# Patient Record
Sex: Female | Born: 1970 | Hispanic: No | Marital: Married | Smoking: Never smoker
Health system: Southern US, Community
[De-identification: ages and names within clinical notes are randomized; demographics above are authoritative.]

## PROBLEM LIST (undated history)

## (undated) DIAGNOSIS — B019 Varicella without complication: Secondary | ICD-10-CM

## (undated) DIAGNOSIS — Z0000 Encounter for general adult medical examination without abnormal findings: Secondary | ICD-10-CM

## (undated) DIAGNOSIS — N760 Acute vaginitis: Principal | ICD-10-CM

## (undated) DIAGNOSIS — J341 Cyst and mucocele of nose and nasal sinus: Secondary | ICD-10-CM

## (undated) DIAGNOSIS — F419 Anxiety disorder, unspecified: Secondary | ICD-10-CM

## (undated) DIAGNOSIS — T7840XA Allergy, unspecified, initial encounter: Secondary | ICD-10-CM

## (undated) DIAGNOSIS — B351 Tinea unguium: Secondary | ICD-10-CM

## (undated) DIAGNOSIS — M545 Low back pain: Secondary | ICD-10-CM

## (undated) DIAGNOSIS — R5383 Other fatigue: Secondary | ICD-10-CM

## (undated) HISTORY — DX: Acute vaginitis: N76.0

## (undated) HISTORY — PX: OTHER SURGICAL HISTORY: SHX169

## (undated) HISTORY — DX: Encounter for general adult medical examination without abnormal findings: Z00.00

## (undated) HISTORY — DX: Other fatigue: R53.83

## (undated) HISTORY — DX: Anxiety disorder, unspecified: F41.9

## (undated) HISTORY — DX: Varicella without complication: B01.9

## (undated) HISTORY — DX: Cyst and mucocele of nose and nasal sinus: J34.1

## (undated) HISTORY — DX: Tinea unguium: B35.1

## (undated) HISTORY — DX: Low back pain: M54.5

## (undated) HISTORY — DX: Allergy, unspecified, initial encounter: T78.40XA

---

## 2000-07-09 LAB — HM PAP SMEAR

## 2010-03-09 ENCOUNTER — Encounter: Payer: Self-pay | Admitting: Family Medicine

## 2010-03-09 LAB — CONVERTED CEMR LAB

## 2010-09-25 ENCOUNTER — Encounter: Payer: Self-pay | Admitting: Family Medicine

## 2010-09-25 ENCOUNTER — Ambulatory Visit (INDEPENDENT_AMBULATORY_CARE_PROVIDER_SITE_OTHER): Payer: BC Managed Care – PPO | Admitting: Family Medicine

## 2010-09-25 DIAGNOSIS — Z87448 Personal history of other diseases of urinary system: Secondary | ICD-10-CM

## 2010-09-25 DIAGNOSIS — E876 Hypokalemia: Secondary | ICD-10-CM

## 2010-09-25 DIAGNOSIS — J342 Deviated nasal septum: Secondary | ICD-10-CM | POA: Insufficient documentation

## 2010-09-25 DIAGNOSIS — Z9189 Other specified personal risk factors, not elsewhere classified: Secondary | ICD-10-CM | POA: Insufficient documentation

## 2010-09-25 DIAGNOSIS — J019 Acute sinusitis, unspecified: Secondary | ICD-10-CM | POA: Insufficient documentation

## 2010-10-02 ENCOUNTER — Other Ambulatory Visit: Payer: Self-pay | Admitting: Family Medicine

## 2010-10-02 DIAGNOSIS — J342 Deviated nasal septum: Secondary | ICD-10-CM

## 2010-10-03 ENCOUNTER — Telehealth: Payer: Self-pay

## 2010-10-03 NOTE — Telephone Encounter (Signed)
Left a message for pt to return my call. 

## 2010-10-03 NOTE — Telephone Encounter (Signed)
It is up to her but at this point I would probably proceed if I were her, it is the only way to get further info.

## 2010-10-03 NOTE — Telephone Encounter (Signed)
Pt called stating that sore in nose had "popped" she was wandering if she still needed to go for the CT?

## 2010-10-03 NOTE — Telephone Encounter (Signed)
Pt informed and states she will go to the CT appt

## 2010-10-04 ENCOUNTER — Ambulatory Visit (HOSPITAL_COMMUNITY)
Admission: RE | Admit: 2010-10-04 | Discharge: 2010-10-04 | Disposition: A | Discharge status: Home / Self Care | Payer: BC Managed Care – PPO | Source: Ambulatory Visit | Attending: Family Medicine | Admitting: Family Medicine

## 2010-10-04 DIAGNOSIS — J320 Chronic maxillary sinusitis: Secondary | ICD-10-CM | POA: Insufficient documentation

## 2010-10-04 DIAGNOSIS — J342 Deviated nasal septum: Secondary | ICD-10-CM

## 2010-10-04 DIAGNOSIS — J3489 Other specified disorders of nose and nasal sinuses: Secondary | ICD-10-CM | POA: Insufficient documentation

## 2010-10-05 ENCOUNTER — Other Ambulatory Visit: Payer: Self-pay

## 2010-10-05 MED ORDER — LEVOFLOXACIN 500 MG PO TABS: 500.0000 mg | ORAL_TABLET | Freq: Every day | ORAL | Status: AC

## 2010-10-05 NOTE — Progress Notes (Signed)
Pt informed

## 2010-10-05 NOTE — Assessment & Plan Note (Signed)
Summary: New pt est care/dt [employer]   Vital Signs:  Patient profile:   40 year old female Menstrual status:  regular LMP:     09/12/2010 Height:      68.7 inches (174.50 cm) Weight:      141.75 pounds (64.43 kg) BMI:     21.19 O2 Sat:      100 % on Room air Temp:     97.7 degrees F (36.50 degrees C) oral Pulse rate:   74 / minute BP sitting:   111 / 72  (right arm)  Vitals Entered By: Josph Macho RMA (September 25, 2010 2:25 PM)  O2 Flow:  Room air CC: Establish new patient/ CF Is Patient Diabetic? No LMP (date): 09/12/2010     Menstrual Status regular Enter LMP: 09/12/2010 Last PAP Result historical   History of Present Illness:  patient is a 40 year old female in today to establish care. she has a couple of concerns. She notes she had a bad sinus infection in December of 2000 and and ultimately was treated with antibiotics at that time noted a palpable lump or lesion at the base of her left nares which has not resolved. She continues to have nasal congestion difficulty with her breathing at times,  he denies any fevers, chills, headache, ear pain, sore throat , green rhinorrhea. she brings in lab work done at her place of work which was all within normal limits cholesterol was normal hepatic and CBC were normal but her potassium was mildly low at 3.3 she denies muscle cramps or fasciculations. 2 G2 P2 status post 2 spontaneous vaginal deliveries and received her GYN care at [facility] care. She is a distant history of some abnormal cervical cytology did undergo cryotherapy and cervical biopsy. Repeat biopsy was normal. Patient is denying any chest pain, palpitations, shortness of breath , GI or GU concerns at this time  Preventive Screening-Counseling & Management  Alcohol-Tobacco     Smoking Status: never  Caffeine-Diet-Exercise     Does Patient Exercise: yes      Drug Use:  no.    Current Medications (verified): 1)  Allegra Allergy 180 Mg Tabs (Fexofenadine Hcl)  .... Qd 2)  Prenatal Vitamin .... 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, 2 children Never Smoked Alcohol use-yes, occasional special  events Drug use-no Wears seat belt  No dietary restrictions Regular exercise-yes, run and walk and weight lifting Smoking Status:  never Occupation:  employed Drug Use:  no Does Patient Exercise:  yes  Review of Systems  The patient denies anorexia, fever, weight loss, weight gain, vision loss, decreased hearing, hoarseness, chest pain, syncope, dyspnea on exertion, peripheral edema, prolonged cough, headaches, hemoptysis, abdominal pain, melena, hematochezia, severe indigestion/heartburn, hematuria, incontinence, genital sores, muscle weakness, suspicious skin lesions, transient blindness, difficulty walking, depression, unusual weight change, abnormal bleeding, enlarged lymph nodes, angioedema, breast masses, and testicular masses.    Physical Exam  General:  Well-developed,well-nourished,in no acute distress; alert,appropriate and cooperative throughout examination Head:  Normocephalic and atraumatic without obvious abnormalities. No apparent alopecia or balding. Eyes:  No corneal or conjunctival inflammation noted. EOMI. Perrla.  Ears:  External  ear exam shows no significant lesions or deformities.  Otoscopic examination reveals clear canals, tympanic membranes are intact bilaterally without bulging, retraction, inflammation or discharge. Hearing is grossly normal bilaterally. Nose:  External nasal examination shows no deformity or  inflammation. Nasal mucosa are pink and moist without lesions or exudates.boggy enlarged anterior turbinates noted b/l L>R. Bony proiminence noted at opening of left nares laterally Mouth:  Oral mucosa and oropharynx without lesions or exudates.  Teeth in good repair. Neck:  No deformities, masses, or tenderness noted. Lungs:  Normal respiratory effort, chest expands symmetrically. Lungs are clear to auscultation, no crackles or wheezes. Heart:  Normal rate and regular rhythm. S1 and S2 normal without gallop, murmur, click, rub or other extra sounds. Abdomen:  Bowel sounds positive,abdomen soft and non-tender without masses, organomegaly or hernias noted. Msk:  No deformity or scoliosis noted of thoracic or lumbar spine.   Pulses:  R and L carotid, dorsalis pedis and posterior tibial pulses are full and equal bilaterally Extremities:  No clubbing, cyanosis, edema, or deformity noted with normal full range of motion of all joints.   Neurologic:  No cranial nerve deficits noted. Station and gait are normal. Plantar reflexes are down-going bilaterally. DTRs are symmetrical throughout. Sensory, motor and coordinative functions appear intact. Skin:  Intact without suspicious lesions or rashes Cervical Nodes:  No lymphadenopathy noted Psych:  Cognition and judgment appear intact. Alert and cooperative with normal attention span and concentration. No apparent delusions, illusions, hallucinations   Impression & Recommendations:  Problem # 1:  ACUTE SINUSITIS, UNSPECIFIED (ICD-461.9)  Her updated medication list for this problem includes:    Ayr 0.65 % Soln (Saline) ..... Spray in each nostril bid    Flonase 50 Mcg/act Susp (Fluticasone propionate) .Marland Kitchen... 1-2 sprays each nostril daily as needed allergies  Orders: Radiology Referral (Radiology) With bony prminence noted and deviated septum will obtain a CT sinuses to further evaluate  Allegra is causing some sedation, she may try Zyrtec  Problem #  2:  HYPOKALEMIA, MILD (ICD-276.8)  Orders: T-Basic Metabolic Panel [phone]) Encouraged increased K in diet and recheck level  Problem # 3:  Preventive Health Care (ICD-V70.0) Doing well, labs at work normal except K will continue to monitor  Complete Medication List: 1)  Allegra Allergy 180 Mg Tabs (Fexofenadine hcl) .... Qd 2)  Prenatal Vitamin  .... Once daily 3)  Ayr 0.65 % Soln (Saline) .... Spray in each nostril bid 4)  Zyrtec Allergy 10 Mg Tabs (Cetirizine hcl) .Marland Kitchen.. 1 tab by mouth daily for allergies, will replace allegra 5)  Flonase 50 Mcg/act Susp (Fluticasone propionate) .Marland Kitchen.. 1-2 sprays each nostril daily as needed allergies  Patient Instructions: 1)  Please schedule a follow-up appointment in 2 to 3  months.  2)  Call with any concerns   Orders Added: 1)  Radiology Referral [Radiology] 2)  T-Basic Metabolic Panel [phone] 3)  New Patient 18-39 years [[id number]]    Preventive Care Screening  Pap Smear:    Date:  03/09/2010    Results:  historical    Appended Document: Orders Update    Clinical Lists Changes  Orders: Added new Referral order of Radiology Referral (Radiology) - Signed

## 2010-11-02 ENCOUNTER — Encounter: Payer: Self-pay | Admitting: Nurse Practitioner

## 2010-11-02 NOTE — Progress Notes (Signed)
Subjective:    Gail Sanchez is a 40 y.o. female who presents for evaluation of discharge, erythema, itching, pain and tearing in left eye worse than right. She has noticed the above symptoms for 2 days. Onset was acute. Patient denies blurred vision, foreign body sensation, photophobia and visual field deficit. There is a history of eye problems.  The following portions of the patient's history were reviewed and updated as appropriate: allergies, current medications, past family history, past medical history, past social history, past surgical history and problem list.  Review of Systems Pertinent items are noted in HPI.   Objective:    There were no vitals taken for this visit.      General: alert, cooperative, appears stated age and no distress  Eyes:  positive findings: conjunctiva: erythematous and sclera injected bil greater on left than right,   Vision: Not performed  Fluorescein:  not done     Assessment:    Acute conjunctivitis   Plan:    Discussed the diagnosis and proper care of conjunctivitis.  Stressed household Presenter, broadcasting. Ophthalmic drops per orders. Warm compress to eye(s). FU here in 2 days or PRN.

## 2011-06-06 ENCOUNTER — Ambulatory Visit: Payer: BC Managed Care – PPO | Admitting: Family Medicine

## 2011-09-28 ENCOUNTER — Encounter: Payer: Self-pay | Admitting: Family Medicine

## 2011-09-28 ENCOUNTER — Ambulatory Visit (INDEPENDENT_AMBULATORY_CARE_PROVIDER_SITE_OTHER): Payer: BC Managed Care – PPO | Admitting: Family Medicine

## 2011-09-28 VITALS — BP 129/82 | HR 85 | Temp 99.2°F | Ht 68.75 in | Wt 191.0 lb

## 2011-09-28 DIAGNOSIS — E876 Hypokalemia: Secondary | ICD-10-CM

## 2011-09-28 DIAGNOSIS — J019 Acute sinusitis, unspecified: Secondary | ICD-10-CM

## 2011-09-28 DIAGNOSIS — R5383 Other fatigue: Secondary | ICD-10-CM

## 2011-09-28 DIAGNOSIS — R5381 Other malaise: Secondary | ICD-10-CM

## 2011-09-28 HISTORY — DX: Other fatigue: R53.83

## 2011-09-28 MED ORDER — MUPIROCIN 2 % EX OINT: TOPICAL_OINTMENT | Freq: Three times a day (TID) | CUTANEOUS | Status: AC

## 2011-09-28 MED ORDER — AMOXICILLIN-POT CLAVULANATE 875-125 MG PO TABS: 1.0000 | ORAL_TABLET | Freq: Two times a day (BID) | ORAL | Status: AC

## 2011-09-28 NOTE — Assessment & Plan Note (Addendum)
Left sided with recurrent follicular lesion in nares, burst this am. Patient is [redacted] weeks pregnant at this time, she is started on Mupirocin ointment qhs, after cleansing with peroxide and given Augmentin, she will report if symptoms worsen and she will return after preganacy for further evaluation and possible referral to ent

## 2011-09-28 NOTE — Assessment & Plan Note (Signed)
Given a handout for hi potassium foods, and we will check levels after pregnancy

## 2011-09-28 NOTE — Progress Notes (Signed)
Patient ID: Gail Sanchez, female   DOB: 1970-07-20, 41 y.o.   MRN: [id number] Thelia Tanksley Monterosso [id number] 1971-02-04 09/28/2011      Progress Note-Follow Up  Subjective  Chief Complaint  Chief Complaint  Patient presents with  . lump in nose    burst today, has had before    HPI  Patient is a 41 year old female who is in today for evaluation of recurrent left nares lesions and sinusitis. She is actually [redacted] weeks pregnant at this time and has been struggling with all painful lesion in her left nostril off and on for the last year. This morning the lesion ruptured and she expelled a great deal of yellowish liquid. It was nonmobile rest. She's not had any fevers, chills. She does have an occasional headache but none recently. She denies any sore throat, ear pain, chest congestion, chest pain, palpitations, shortness of breath. She denies any anorexia GI or GU complaints. She has seen ENT in the distant past for these lesions and was offered some reassurance that was unremarkable it is now been very recurrent and symptomatic for a long time. She denies any similar lesions in the right nares. She is also complaining of excessive fatigue. She feels like she sleeps a good 8-10 hours and still feels very tired to the point where she can fall sleep in the morning. She denies snoring. She's not had any recent muscle cramping or other concerns today.    History   Social History  . Marital Status: Married    Spouse Name: N/A    Number of Children: N/A  . Years of Education: N/A   Occupational History  . Not on file.   Social History Main Topics  . Smoking status: Never Smoker   . Smokeless tobacco: Never Used  . Alcohol Use: Yes     occ  . Drug Use: No  . Sexually Active: Not on file   Other Topics Concern  . Not on file   Social History Narrative  . No narrative on file    No current outpatient prescriptions on file prior to visit.    No Known Allergies  Review of  Systems  Review of Systems  Constitutional: Positive for malaise/fatigue. Negative for fever.  HENT: Positive for congestion. Negative for sore throat.   Eyes: Negative for discharge.  Respiratory: Negative for shortness of breath.   Cardiovascular: Negative for chest pain, palpitations and leg swelling.  Gastrointestinal: Negative for nausea, abdominal pain and diarrhea.  Genitourinary: Negative for dysuria.  Musculoskeletal: Negative for falls.  Skin: Negative for rash.  Neurological: Negative for loss of consciousness and headaches.  Endo/Heme/Allergies: Negative for polydipsia.  Psychiatric/Behavioral: Negative for depression and suicidal ideas. The patient is not nervous/anxious and does not have insomnia.     Objective  BP 129/82  Pulse 85  Temp(Src) 99.2 F (37.3 C) (Temporal)  Ht 5' 8.75" (1.746 m)  Wt 191 lb (86.637 kg)  BMI 28.41 kg/m2  Physical Exam  Physical Exam  Constitutional: She is oriented to person, place, and time and well-developed, well-nourished, and in no distress. No distress.       pregnant  HENT:  Head: Normocephalic and atraumatic.       Nasal mucosa boggy and erythematous, turbinates hypertrophied  Eyes: Conjunctivae are normal.  Neck: Neck supple. No thyromegaly present.  Cardiovascular: Normal rate, regular rhythm and normal heart sounds.   No murmur heard. Pulmonary/Chest: Effort normal and breath sounds normal. She has no wheezes.  Abdominal: She exhibits no distension and no mass.  Musculoskeletal: She exhibits no edema.  Lymphadenopathy:    She has no cervical adenopathy.  Neurological: She is alert and oriented to person, place, and time.  Skin: Skin is warm and dry. No rash noted. She is not diaphoretic.  Psychiatric: Memory, affect and judgment normal.       Assessment & Plan    ACUTE SINUSITIS, UNSPECIFIED Left sided with recurrent follicular lesion in nares, burst this am. Patient is [redacted] weeks pregnant at this time, she  is started on Mupirocin ointment qhs, after cleansing with peroxide and given Augmentin, she will report if symptoms worsen and she will return after preganacy for further evaluation and possible referral to ent  HYPOKALEMIA, MILD Given a handout for hi potassium foods, and we will check levels after pregnancy  Fatigue Multifactorial and was present prior to pregnancy. Willl check annual labs with TSH, cbc and vitamin levels after pregnancy and she will check with her husband to see if she snores.

## 2011-09-28 NOTE — Patient Instructions (Signed)
Sinusitis Sinuses are air pockets within the bones of your face. The growth of bacteria within a sinus leads to infection. The infection prevents the sinuses from draining. This infection is called sinusitis. SYMPTOMS  There will be different areas of pain depending on which sinuses have become infected.  The maxillary sinuses often produce pain beneath the eyes.   Frontal sinusitis may cause pain in the middle of the forehead and above the eyes.  Other problems (symptoms) include:  Toothaches.   Colored, pus-like (purulent) drainage from the nose.   Swelling, warmth, and tenderness over the sinus areas may be signs of infection.  TREATMENT  Sinusitis is most often determined by an exam.X-rays may be taken. If x-rays have been taken, make sure you obtain your results or find out how you are to obtain them. Your caregiver may give you medications (antibiotics). These are medications that will help kill the bacteria causing the infection. You may also be given a medication (decongestant) that helps to reduce sinus swelling.  HOME CARE INSTRUCTIONS   Only take over-the-counter or prescription medicines for pain, discomfort, or fever as directed by your caregiver.   Drink extra fluids. Fluids help thin the mucus so your sinuses can drain more easily.   Applying either moist heat or ice packs to the sinus areas may help relieve discomfort.   Use saline nasal sprays to help moisten your sinuses. The sprays can be found at your local drugstore.  SEEK IMMEDIATE MEDICAL CARE IF:  You have a fever.   You have increasing pain, severe headaches, or toothache.   You have nausea, vomiting, or drowsiness.   You develop unusual swelling around the face or trouble seeing.  MAKE SURE YOU:   Understand these instructions.   Will watch your condition.   Will get help right away if you are not doing well or get worse.  Document Released: 06/25/2005 Document Revised: 06/14/2011 Document Reviewed:  01/22/2007 [facility] Patient Information 2012 [place], [place].  Clean nares with hydrogen peroxide nightly then apply antibiotic ointment

## 2011-09-28 NOTE — Assessment & Plan Note (Signed)
Multifactorial and was present prior to pregnancy. Willl check annual labs with TSH, cbc and vitamin levels after pregnancy and she will check with her husband to see if she snores.

## 2011-12-27 ENCOUNTER — Encounter: Payer: Self-pay | Admitting: Family Medicine

## 2011-12-27 ENCOUNTER — Other Ambulatory Visit (HOSPITAL_COMMUNITY)
Admission: RE | Admit: 2011-12-27 | Discharge: 2011-12-27 | Disposition: A | Discharge status: Home / Self Care | Payer: BC Managed Care – PPO | Source: Ambulatory Visit | Attending: Family Medicine | Admitting: Family Medicine

## 2011-12-27 ENCOUNTER — Ambulatory Visit (INDEPENDENT_AMBULATORY_CARE_PROVIDER_SITE_OTHER): Payer: BC Managed Care – PPO | Admitting: Family Medicine

## 2011-12-27 VITALS — BP 124/78 | HR 66 | Temp 98.5°F | Ht 68.75 in | Wt 153.4 lb

## 2011-12-27 DIAGNOSIS — N760 Acute vaginitis: Secondary | ICD-10-CM | POA: Insufficient documentation

## 2011-12-27 HISTORY — DX: Acute vaginitis: N76.0

## 2011-12-27 MED ORDER — FLUCONAZOLE 150 MG PO TABS: 150.0000 mg | ORAL_TABLET | ORAL | Status: DC

## 2011-12-27 NOTE — Assessment & Plan Note (Signed)
Exam c/w candida, given an rx for diflucan and asked to start a probiotic, culture checked for bv and yeast

## 2011-12-27 NOTE — Progress Notes (Signed)
Patient ID: Gail Sanchez, female   DOB: 20-Dec-1970, 41 y.o.   MRN: [id number] Gail Sanchez [id number] 1971-06-08 12/27/2011      Progress Note-Follow Up  Subjective  Chief Complaint  Chief Complaint  Patient presents with  . Vaginal Discharge    odor but no itching or burning    HPI  Patient is a 41 year old female who is in today complaining of some vaginal discharge with odor. She denies any itching or burning. She denies any abdominal pain or back pain. She denies any fevers or chills. She's not had any recent change in partners for any urinary complaints. She is just come off her menstrual cycle and notes that this now with slightly worse before her cycle and is now. No anorexia, nausea, chest pain, palpitations or other concerns noted today appear  Past Medical History  Diagnosis Date  . Fatigue 09/28/2011  . Chicken pox as a child  . Vaginitis 12/27/2011    Past Surgical History  Procedure Date  . Cervix frozen     Family History  Problem Relation Age of Onset  . Cancer Mother   . Hypertension Father   . Asthma Brother     History   Social History  . Marital Status: Married    Spouse Name: N/A    Number of Children: N/A  . Years of Education: N/A   Occupational History  . Not on file.   Social History Main Topics  . Smoking status: Never Smoker   . Smokeless tobacco: Never Used  . Alcohol Use: Yes     occ  . Drug Use: No  . Sexually Active: Not on file   Other Topics Concern  . Not on file   Social History Narrative  . No narrative on file    Current Outpatient Prescriptions on File Prior to Visit  Medication Sig Dispense Refill  . Prenatal Vit-Fe Fumarate-FA (PRENATAL MULTIVITAMIN) TABS Take 1 tablet by mouth daily.        No Known Allergies  Review of Systems  Review of Systems  Constitutional: Negative for fever, chills and malaise/fatigue.  HENT: Negative for congestion.   Eyes: Negative for discharge.  Respiratory:  Negative for shortness of breath.   Cardiovascular: Negative for chest pain, palpitations and leg swelling.  Gastrointestinal: Negative for nausea, abdominal pain, diarrhea and constipation.  Genitourinary: Negative for dysuria, urgency, frequency, hematuria and flank pain.  Musculoskeletal: Negative for falls.  Skin: Negative for rash.  Neurological: Negative for loss of consciousness and headaches.  Endo/Heme/Allergies: Negative for polydipsia.  Psychiatric/Behavioral: Negative for depression and suicidal ideas. The patient is not nervous/anxious and does not have insomnia.     Objective  BP 124/78  Pulse 66  Temp 98.5 F (36.9 C) (Temporal)  Ht 5' 8.75" (1.746 m)  Wt 153 lb 6.4 oz (69.582 kg)  BMI 22.82 kg/m2  SpO2 98%  LMP 12/20/2011  Breastfeeding? Unknown  Physical Exam  Physical Exam  Constitutional: She is oriented to person, place, and time and well-developed, well-nourished, and in no distress. No distress.  HENT:  Head: Normocephalic and atraumatic.  Eyes: Conjunctivae are normal.  Neck: Neck supple. No thyromegaly present.  Cardiovascular: Normal rate, regular rhythm and normal heart sounds.   No murmur heard. Pulmonary/Chest: Effort normal and breath sounds normal. She has no wheezes.  Abdominal: She exhibits no distension and no mass.  Genitourinary: Uterus normal and cervix normal. Vaginal discharge found.       Thick white vaginal discharge  Musculoskeletal: She exhibits no edema.  Lymphadenopathy:    She has no cervical adenopathy.  Neurological: She is alert and oriented to person, place, and time.  Skin: Skin is warm and dry. No rash noted. She is not diaphoretic.  Psychiatric: Memory, affect and judgment normal.      Assessment & Plan  Vaginitis Exam c/w candida, given an rx for diflucan and asked to start a probiotic, culture checked for bv and yeast

## 2011-12-27 NOTE — Patient Instructions (Addendum)
Vaginitis Vaginitis is an infection. It causes soreness, swelling, and redness (inflammation) of the vagina. Many of these infections are sexually transmitted diseases (STDs). Having unprotected sex can cause further problems and complications such as:  Chronic pelvic pain.   Infertility.   Unwanted pregnancy.   Abortion.   Tubal pregnancy.   Infection passed on to the newborn.   Cancer.  CAUSES   Monilia. This is a yeast or fungus infection, not an STD.   Bacterial vaginosis. The normal balance of bacteria in the vagina is disrupted and is replaced by an overgrowth of certain bacteria.   Gonorrhea, chlamydia. These are bacterial infections that are STDs.   Vaginal sponges, diaphragms, and intrauterine devices.   Trichomoniasis. This is a STD infection caused by a parasite.   Viruses like herpes and human papillomavirus. Both are STDs.   Pregnancy.   Immunosuppression. This occurs with certain conditions such as HIV infection or cancer.   Using bubble bath.   Taking certain antibiotic medicines.   Sporadic recurrence can occur if you become sick.   Diabetes.   Steroids.   Allergic reaction. If you have an allergy to:   Douches.   Soaps.   Spermicides.   Condoms.   Scented tampons or vaginal sprays.  SYMPTOMS   Abnormal vaginal discharge.   Itching of the vagina.   Pain in the vagina.   Swelling of the vagina.  In some cases, there are no symptoms. TREATMENT  Treatment will vary depending on the type of infection.  Bacteria or trichomonas are usually treated with oral antibiotics and sometimes vaginal cream or suppositories.   Monilia vaginitis is usually treated with vaginal creams, suppositories, or oral antifungal pills.   Viral vaginitis has no cure. However, the symptoms of herpes (a viral vaginitis) can be treated to relieve the discomfort. Human papillomavirus has no symptoms. However, there are treatments for the diseases caused by human  papillomavirus.   With allergic vaginitis, you need to stop using the product that is causing the problem. Vaginal creams can be used to treat the symptoms.   When treating an STD, the sex partner should also be treated.  HOME CARE INSTRUCTIONS   Take all the medicines as directed by your caregiver.   Do not use scented tampons, soaps, or vaginal sprays.   Do not douche.   Tell your sex partner if you have a vaginal infection or an STD.   Do not have sexual intercourse until you have treated the vaginitis.   Practice safe sex by using condoms.  SEEK MEDICAL CARE IF:   You have abdominal pain.   Your symptoms get worse during treatment.  Document Released: 04/22/2007 Document Revised: 06/14/2011 Document Reviewed: 12/16/2008 [employer] Patient Information 2012 [employer]. 

## 2012-01-04 ENCOUNTER — Telehealth: Payer: Self-pay

## 2012-01-04 MED ORDER — METRONIDAZOLE 500 MG PO TABS: 500.0000 mg | ORAL_TABLET | Freq: Two times a day (BID) | ORAL | Status: DC

## 2012-01-04 NOTE — Telephone Encounter (Signed)
OK to treat the Flagyl 500 mg po bid x 5 days

## 2012-01-04 NOTE — Telephone Encounter (Signed)
I spoke with Cytology on pts ancillary orders and the Gardnerella is positive and the machine that checks Candida is broke and Cytology is checking on this. Please advise?

## 2012-01-04 NOTE — Telephone Encounter (Signed)
Patient informed and RX sent to pharmacy 

## 2012-05-26 ENCOUNTER — Other Ambulatory Visit (HOSPITAL_COMMUNITY): Payer: Self-pay | Admitting: Obstetrics and Gynecology

## 2012-05-26 DIAGNOSIS — Z1231 Encounter for screening mammogram for malignant neoplasm of breast: Secondary | ICD-10-CM

## 2012-05-27 ENCOUNTER — Other Ambulatory Visit (INDEPENDENT_AMBULATORY_CARE_PROVIDER_SITE_OTHER): Payer: BC Managed Care – PPO

## 2012-05-27 DIAGNOSIS — Z0000 Encounter for general adult medical examination without abnormal findings: Secondary | ICD-10-CM

## 2012-05-27 DIAGNOSIS — E876 Hypokalemia: Secondary | ICD-10-CM

## 2012-05-27 LAB — RENAL FUNCTION PANEL
Albumin: 4.2 g/dL (ref 3.5–5.2)
CO2: 29 mEq/L (ref 19–32)
Calcium: 9.1 mg/dL (ref 8.4–10.5)
Glucose, Bld: 74 mg/dL (ref 70–99)
Potassium: 3.2 mEq/L — ABNORMAL LOW (ref 3.5–5.1)

## 2012-05-27 LAB — HEPATIC FUNCTION PANEL
ALT: 13 U/L (ref 0–35)
AST: 17 U/L (ref 0–37)
Alkaline Phosphatase: 54 U/L (ref 39–117)
Bilirubin, Direct: 0 mg/dL (ref 0.0–0.3)
Total Protein: 7.4 g/dL (ref 6.0–8.3)

## 2012-05-27 LAB — LIPID PANEL
Cholesterol: 185 mg/dL (ref 0–200)
LDL Cholesterol: 100 mg/dL — ABNORMAL HIGH (ref 0–99)
Total CHOL/HDL Ratio: 3

## 2012-05-27 LAB — CBC WITH DIFFERENTIAL/PLATELET
Basophils Relative: 0.3 % (ref 0.0–3.0)
Eosinophils Relative: 6.5 % — ABNORMAL HIGH (ref 0.0–5.0)
HCT: 40 % (ref 36.0–46.0)
MCV: 91.2 fl (ref 78.0–100.0)
Monocytes Absolute: 0.3 10*3/uL (ref 0.1–1.0)
Neutrophils Relative %: 64.3 % (ref 43.0–77.0)
RBC: 4.38 Mil/uL (ref 3.87–5.11)
WBC: 5.8 10*3/uL (ref 4.5–10.5)

## 2012-05-28 MED ORDER — POTASSIUM CHLORIDE CRYS ER 20 MEQ PO TBCR: 20.0000 meq | EXTENDED_RELEASE_TABLET | Freq: Every day | ORAL | Status: DC

## 2012-05-28 NOTE — Progress Notes (Signed)
Quick Note:  Patient Informed and voiced understanding.  RX sent to pharmacy ______ 

## 2012-05-28 NOTE — Addendum Note (Signed)
Addended by: Court Joy on: 05/28/2012 04:52 PM   Modules accepted: Orders

## 2012-05-29 ENCOUNTER — Telehealth: Payer: Self-pay

## 2012-05-29 NOTE — Telephone Encounter (Signed)
Patient informed. 

## 2012-05-29 NOTE — Telephone Encounter (Signed)
She can take the Klor con and antihistamine together and not worry.  This potential interaction is not of any clinical significance that I know of. -thx

## 2012-05-29 NOTE — Telephone Encounter (Signed)
Patient states she picked up the Klor Con and read that theres an interaction with antihistamines? Pt states she does take an antihistamine and wants to make sure this is okay. Please advise?

## 2012-06-09 ENCOUNTER — Ambulatory Visit (INDEPENDENT_AMBULATORY_CARE_PROVIDER_SITE_OTHER): Payer: BC Managed Care – PPO | Admitting: Family Medicine

## 2012-06-09 ENCOUNTER — Encounter: Payer: Self-pay | Admitting: Family Medicine

## 2012-06-09 VITALS — BP 112/78 | HR 63 | Temp 98.9°F | Ht 68.75 in | Wt 141.0 lb

## 2012-06-09 DIAGNOSIS — Z23 Encounter for immunization: Secondary | ICD-10-CM

## 2012-06-09 DIAGNOSIS — J341 Cyst and mucocele of nose and nasal sinus: Secondary | ICD-10-CM

## 2012-06-09 DIAGNOSIS — E876 Hypokalemia: Secondary | ICD-10-CM

## 2012-06-09 DIAGNOSIS — L509 Urticaria, unspecified: Secondary | ICD-10-CM

## 2012-06-09 DIAGNOSIS — Z0000 Encounter for general adult medical examination without abnormal findings: Secondary | ICD-10-CM

## 2012-06-09 DIAGNOSIS — J3489 Other specified disorders of nose and nasal sinuses: Secondary | ICD-10-CM

## 2012-06-09 DIAGNOSIS — T7840XA Allergy, unspecified, initial encounter: Secondary | ICD-10-CM

## 2012-06-09 HISTORY — DX: Cyst and mucocele of nose and nasal sinus: J34.1

## 2012-06-09 HISTORY — DX: Allergy, unspecified, initial encounter: T78.40XA

## 2012-06-09 HISTORY — DX: Encounter for general adult medical examination without abnormal findings: Z00.00

## 2012-06-09 LAB — RENAL FUNCTION PANEL
CO2: 29 mEq/L (ref 19–32)
Calcium: 9.6 mg/dL (ref 8.4–10.5)
Chloride: 99 mEq/L (ref 96–112)
GFR: 94.93 mL/min (ref >60.00)
Potassium: 4 mEq/L (ref 3.5–5.1)
Sodium: 137 mEq/L (ref 135–145)

## 2012-06-09 NOTE — Assessment & Plan Note (Signed)
Given a handout on potassium rich foods to try and will check a level today before deciding if she needs to remain on a supplement.

## 2012-06-09 NOTE — Assessment & Plan Note (Signed)
Responded to Mupirocin and hydrogen peroxide will have her try this again and if does not respond needs to return to ENT for further consideration

## 2012-06-09 NOTE — Assessment & Plan Note (Signed)
Unclear etiology, is following with [facility], has tried antihistamines and is now on a course of steroids which is helping some, Did not feel that Claritin was working well so she is encouraged to try Zyrtec bid and Bendryl qhs prn. Increase hydration and fatty acid supplements.

## 2012-06-09 NOTE — Assessment & Plan Note (Signed)
Encouraged heart healthy diet and regular exercise, had tdap during her pregnancy in 2012 and was given a flu shot today. Does not require annual pap at this time.

## 2012-06-09 NOTE — Patient Instructions (Addendum)
Try Cetrizine/Zyrtec 10 mg twice a day and Benadryl at bedtime Increase hydration and try adding a fatty acid supplement like fish oil or MegaRed krill oil daily  Hives Hives are itchy, red, swollen areas of the skin. They can vary in size and location on your body. Hives can come and go for hours or several days (acute hives) or for several weeks (chronic hives). Hives do not spread from person to person (noncontagious). They may get worse with scratching, exercise, and emotional stress. CAUSES   Allergic reaction to food, additives, or drugs.  Infections, including the common cold.  Illness, such as vasculitis, lupus, or thyroid disease.  Exposure to sunlight, heat, or cold.  Exercise.  Stress.  Contact with chemicals. SYMPTOMS   Red or white swollen patches on the skin. The patches may change size, shape, and location quickly and repeatedly.  Itching.  Swelling of the hands, feet, and face. This may occur if hives develop deeper in the skin. DIAGNOSIS  Your caregiver can usually tell what is wrong by performing a physical exam. Skin or blood tests may also be done to determine the cause of your hives. In some cases, the cause cannot be determined. TREATMENT  Mild cases usually get better with medicines such as antihistamines. Severe cases may require an emergency epinephrine injection. If the cause of your hives is known, treatment includes avoiding that trigger.  HOME CARE INSTRUCTIONS   Avoid causes that trigger your hives.  Take antihistamines as directed by your caregiver to reduce the severity of your hives. Non-sedating or low-sedating antihistamines are usually recommended. Do not drive while taking an antihistamine.  Take any other medicines prescribed for itching as directed by your caregiver.  Wear loose-fitting clothing.  Keep all follow-up appointments as directed by your caregiver. SEEK MEDICAL CARE IF:   You have persistent or severe itching that is not  relieved with medicine.  You have painful or swollen joints. SEEK IMMEDIATE MEDICAL CARE IF:   You have a fever.  Your tongue or lips are swollen.  You have trouble breathing or swallowing.  You feel tightness in the throat or chest.  You have abdominal pain. These problems may be the first sign of a life-threatening allergic reaction. Call your local emergency services (911 in [place] MAKE SURE YOU:   Understand these instructions.  Will watch your condition.  Will get help right away if you are not doing well or get worse. Document Released: 06/25/2005 Document Revised: 12/25/2011 Document Reviewed: 09/18/2011 [facility] Patient Information 2013 [place], [place].   Hypokalemia Hypokalemia means a low potassium level in the blood.Potassium is an electrolyte that helps regulate the amount of fluid in the body. It also stimulates muscle contraction and maintains a stable acid-base balance.Most of the body's potassium is inside of cells, and only a very small amount is in the blood. Because the amount in the blood is so small, minor changes can have big effects. PREPARATION FOR TEST Testing for potassium requires taking a blood sample taken by needle from a vein in the arm. The skin is cleaned thoroughly before the sample is drawn. There is no other special preparation needed. NORMAL VALUES Potassium levels below 3.5 mEq/L are abnormally low. Levels above 5.1 mEq/L are abnormally high. Ranges for normal findings may vary among different laboratories and hospitals. You should always check with your doctor after having lab work or other tests done to discuss the meaning of your test results and whether your values are considered within normal  limits. MEANING OF TEST  Your caregiver will go over the test results with you and discuss the importance and meaning of your results, as well as treatment options and the need for additional tests, if necessary. A potassium level is frequently part  of a routine medical exam. It is usually included as part of a whole "panel" of tests for several blood salts (such as Sodium and Chloride). It may be done as part of follow-up when a low potassium level was found in the past or other blood salts are suspected of being out of balance. A low potassium level might be suspected if you have one or more of the following:  Symptoms of weakness.  Abnormal heart rhythms.  High blood pressure and are taking medication to control this, especially water pills (diuretics).  Kidney disease that can affect your potassium level .  Diabetes requiring the use of insulin. The potassium may fall after taking insulin, especially if the diabetes had been out of control for a while.  A condition requiring the use of cortisone-type medication or certain types of antibiotics.  Vomiting and/or diarrhea for more than a day or two.  A stomach or intestinal condition that may not permit appropriate absorption of potassium.  Fainting episodes.  Mental confusion. OBTAINING TEST RESULTS It is your responsibility to obtain your test results. Ask the lab or department performing the test when and how you will get your results.  Please contact your caregiver directly if you have not received the results within one week. At that time, ask if there is anything different or new you should be doing in relation to the results. TREATMENT Hypokalemia can be treated with potassium supplements taken by mouth and/or adjustments in your current medications. A diet high in potassium is also helpful. Foods with high potassium content are:  Peas, lentils, lima beans, nuts, and dried fruit.  Whole grain and bran cereals and breads.  Fresh fruit, vegetables (bananas, cantaloupe, grapefruit, oranges, tomatoes, honeydew melons, potatoes).  Orange and tomato juices.  Meats. If potassium supplement has been prescribed for you today or your medications have been adjusted, see your  personal caregiver in time02 for a re-check. SEEK MEDICAL CARE IF:  There is a feeling of worsening weakness.  You experience repeated chest palpitations.  You are diabetic and having difficulty keeping your blood sugars in the normal range.  You are experiencing vomiting and/or diarrhea.  You are having difficulty with any of your regular medications. SEEK IMMEDIATE MEDICAL CARE IF:  You experience chest pain, shortness of breath, or episodes of dizziness.  You have been having vomiting or diarrhea for more than 2 days.  You have a fainting episode. MAKE SURE YOU:   Understand these instructions.  Will watch your condition.  Will get help right away if you are not doing well or get worse. Document Released: 06/25/2005 Document Revised: 09/17/2011 Document Reviewed: 06/05/2008 [facility] Patient Information 2013 [place], [place].

## 2012-06-09 NOTE — Progress Notes (Signed)
Patient ID: Gail Sanchez, female   DOB: 07/26/1970, 41 y.o.   MRN: [id number] Gail Sanchez [id number] March 28, 1971 06/09/2012      Progress Note New Patient  Subjective  Chief Complaint  Chief Complaint  Patient presents with  . 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 She is generally in good health but does have a few concerns. First she has been struggling with urticaria with itchy skin for roughly a month. She has been following with [facility] dermatology and initially they tried recurrent dosing of antihistamines but she got minimal relief. They have now placed on a steroid pack and she notes her symptoms are somewhat better but not fully gone. She was having raised, red warm patches but that has largely resolved. Her husband is a great deal and she is often home on room kids. Feels she is tolerating it relatively well. She took her tetanus with pertussis in 2012 during her last pregnancy. No recent illness, fevers, chills, CP, palp, sob GI or GU c/o.  Past Medical History  Diagnosis Date  . Fatigue 09/28/2011  . Chicken pox as a child  . Vaginitis 12/27/2011  . Preventative health care 06/09/2012  . 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 Cervix frozen     Family History  Problem Relation Age of Onset  . Cancer Mother   . Hypertension Father   . Asthma Brother     History   Social History  . Marital Status: Married    Spouse Name: N/A    Number of Children: N/A  . Years of Education: N/A   Occupational History  . Not on file.   Social History Main Topics  . Smoking status: Never Smoker   . Smokeless tobacco: Never Used  . Alcohol Use: Yes     Comment: occ  . Drug Use: No  . Sexually Active: Not on file   Other Topics Concern  . Not on file   Social History Narrative  . No narrative  on file    Current Outpatient Prescriptions on File Prior to Visit  Medication Sig Dispense Refill  . fluconazole (DIFLUCAN) 150 MG tablet Take 1 tablet (150 mg total) by mouth once a week. X 2 weeks as needed  2 tablet  1  . metroNIDAZOLE (FLAGYL) 500 MG tablet Take 1 tablet (500 mg total) by mouth 2 (two) times daily.  10 tablet  0  . potassium chloride SA (K-DUR,KLOR-CON) 20 MEQ tablet Take 1 tablet (20 mEq total) by mouth daily.  30 tablet  0  . Prenatal Vit-Fe Fumarate-FA (PRENATAL MULTIVITAMIN) TABS Take 1 tablet by mouth daily.        No Known Allergies  Review of Systems  Review of Systems  Constitutional: Negative for fever, chills and malaise/fatigue.  HENT: Negative for hearing loss, nosebleeds and congestion.   Eyes: Negative for discharge.  Respiratory: Negative for cough, sputum production, shortness of breath and wheezing.   Cardiovascular: Negative for chest pain, palpitations and leg swelling.  Gastrointestinal: Negative for heartburn, nausea, vomiting, abdominal pain, diarrhea, constipation and blood in stool.  Genitourinary: Negative for dysuria, urgency, frequency and hematuria.  Musculoskeletal: Negative for myalgias, back pain and falls.  Skin: Positive for itching and rash.  Neurological: Negative for dizziness, tremors, sensory  change, focal weakness, loss of consciousness, weakness and headaches.  Endo/Heme/Allergies: Negative for polydipsia. Does not bruise/bleed easily.  Psychiatric/Behavioral: Negative for depression and suicidal ideas. The patient is not nervous/anxious and does not have insomnia.     Objective  BP 112/78  Pulse 63  Temp 98.9 F (37.2 C) (Temporal)  Ht 5' 8.75" (1.746 m)  Wt 141 lb (63.957 kg)  BMI 20.97 kg/m2  SpO2 96%  Physical Exam  Physical Exam  Constitutional: She is oriented to person, place, and time and well-developed, well-nourished, and in no distress. No distress.  HENT:  Head: Normocephalic and atraumatic.    Right Ear: External ear normal.  Left Ear: External ear normal.  Nose: Nose normal.  Mouth/Throat: Oropharynx is clear and moist. No oropharyngeal exudate.  Eyes: Conjunctivae normal are normal. Pupils are equal, round, and reactive to light. Right eye exhibits no discharge. Left eye exhibits no discharge. No scleral icterus.  Neck: Normal range of motion. Neck supple. No thyromegaly present.  Cardiovascular: Normal rate, regular rhythm, normal heart sounds and intact distal pulses.   No murmur heard. Pulmonary/Chest: Effort normal and breath sounds normal. No respiratory distress. She has no wheezes. She has no rales.  Abdominal: Soft. Bowel sounds are normal. She exhibits no distension and no mass. There is no tenderness.  Musculoskeletal: Normal range of motion. She exhibits no edema and no tenderness.  Lymphadenopathy:    She has no cervical adenopathy.  Neurological: She is alert and oriented to person, place, and time. She has normal reflexes. No cranial nerve deficit. Coordination normal.  Skin: Skin is warm and dry. No rash noted. She is not diaphoretic.  Psychiatric: Mood, memory and affect normal.       Assessment & Plan  Urticaria Unclear etiology, is following with [facility], has tried antihistamines and is now on a course of steroids which is helping some, Did not feel that Claritin was working well so she is encouraged to try Zyrtec bid and Bendryl qhs prn. Increase hydration and fatty acid supplements.   Preventative health care Encouraged heart healthy diet and regular exercise, had tdap during her pregnancy in 2012 and was given a flu shot today. Does not require annual pap at this time.  HYPOKALEMIA, MILD Given a handout on potassium rich foods to try and will check a level today before deciding if she needs to remain on a supplement.   Retention cyst of nasal cavity Responded to Mupirocin and hydrogen peroxide will have her try this again and if does  not respond needs to return to ENT for further consideration

## 2012-06-10 NOTE — Progress Notes (Signed)
Quick Note:  Patient Informed and voiced understanding ______ 

## 2012-06-23 ENCOUNTER — Ambulatory Visit (HOSPITAL_COMMUNITY)
Admission: RE | Admit: 2012-06-23 | Discharge: 2012-06-23 | Disposition: A | Discharge status: Home / Self Care | Payer: BC Managed Care – PPO | Source: Ambulatory Visit | Attending: Obstetrics and Gynecology | Admitting: Obstetrics and Gynecology

## 2012-06-23 DIAGNOSIS — Z1231 Encounter for screening mammogram for malignant neoplasm of breast: Secondary | ICD-10-CM | POA: Insufficient documentation

## 2013-01-14 ENCOUNTER — Other Ambulatory Visit: Payer: Self-pay | Admitting: Nurse Practitioner

## 2013-01-14 ENCOUNTER — Ambulatory Visit (INDEPENDENT_AMBULATORY_CARE_PROVIDER_SITE_OTHER): Payer: BC Managed Care – PPO | Admitting: Nurse Practitioner

## 2013-01-14 ENCOUNTER — Ambulatory Visit: Payer: BC Managed Care – PPO | Admitting: Nurse Practitioner

## 2013-01-14 ENCOUNTER — Encounter: Payer: Self-pay | Admitting: Nurse Practitioner

## 2013-01-14 VITALS — BP 130/80 | HR 63 | Temp 98.0°F | Resp 16 | Wt 137.0 lb

## 2013-01-14 DIAGNOSIS — E876 Hypokalemia: Secondary | ICD-10-CM

## 2013-01-14 DIAGNOSIS — N898 Other specified noninflammatory disorders of vagina: Secondary | ICD-10-CM

## 2013-01-14 LAB — BASIC METABOLIC PANEL
CO2: 30 mEq/L (ref 19–32)
Chloride: 102 mEq/L (ref 96–112)
Glucose, Bld: 86 mg/dL (ref 70–99)
Potassium: 4.1 mEq/L (ref 3.5–5.3)
Sodium: 139 mEq/L (ref 135–145)

## 2013-01-14 NOTE — Patient Instructions (Addendum)
Our office will call with results of labs. Everything looks normal. Pleasure to meet you.

## 2013-01-14 NOTE — Progress Notes (Signed)
Subjective:     Gail Sanchez is a 42 y.o. female who presents for evaluation of an abnormal vaginal discharge. Symptoms have been present for 3 days. Vaginal symptoms: discharge described as creamy and odor. She denies abnormal bleeding, blisters, bumps, burning, dyspareunia, lesions, local irritation, pain, urinary symptoms of chills, cloudy urine, dysuria, fever ., flank pain none, lower abdominal pain, nausea, urinary frequency, urinary hesitancy, urinary urgency and vomiting and vulvar itching Sexually transmitted infection risk: very low risk of STD exposure and pt states no partner changes. Menstrual flow: regular every 28-30 days.  The following portions of the patient's history were reviewed and updated as appropriate: allergies, current medications, past family history, past medical history, past social history, past surgical history and problem list.   Review of Systems Constitutional: negative for chills, fatigue, fevers and night sweats Gastrointestinal: negative for abdominal pain, constipation, diarrhea and nausea Genitourinary:positive for no symptoms, negative for dysuria, frequency, hematuria, hesitancy and urinary incontinence    Objective:    BP 130/80  Pulse 63  Temp(Src) 98 F (36.7 C) (Oral)  Resp 16  Wt 137 lb 0.6 oz (62.161 kg)  BMI 20.39 kg/m2  SpO2 97%  LMP 01/09/2013 General appearance: alert, cooperative, appears stated age and no distress Head: Normocephalic, without obvious abnormality, atraumatic Eyes: negative findings: lids and lashes normal, conjunctivae and sclerae normal and corneas clear Pelvic: cervix normal in appearance, external genitalia normal, no cervical motion tenderness, perianal skin: no external genital warts noted, urethra without abnormality or discharge and scant creamy discharge at cervix, no odor noticed. Skin: Skin color, texture, turgor normal. No rashes or lesions Lymph nodes: no inguinal LAD    Assessment:  1 normal  vaginal exam, sent wet prep 2 history of hypopotassium, check bmet today.    Plan:  1 Monitor for wet prep results. Pt does not douche. Discussed causes of BV. 2 bmet pending

## 2013-01-15 LAB — WET PREP BY MOLECULAR PROBE: Gardnerella vaginalis: NEGATIVE

## 2013-01-19 ENCOUNTER — Telehealth: Payer: Self-pay | Admitting: Nurse Practitioner

## 2013-01-19 NOTE — Telephone Encounter (Signed)
No vag infection per wet prep results, c/w exam. Potassium normal.

## 2013-01-19 NOTE — Telephone Encounter (Signed)
Left message for patient to return call concerning results.

## 2013-01-30 NOTE — Telephone Encounter (Signed)
Left message for patient to return call concerning results. 2nd message.

## 2013-02-13 NOTE — Telephone Encounter (Signed)
Per Konrad Felix will close encounter.

## 2013-04-13 ENCOUNTER — Telehealth: Payer: Self-pay

## 2013-04-13 NOTE — Telephone Encounter (Signed)
Pt left a message stating that she needs her nasal spray refilled?  Pt was supposed to return to see MD in June?   Please advise refill?

## 2013-04-13 NOTE — Telephone Encounter (Signed)
I do not see a nasal spray in her med list, did she get one elsewhere? If she knows the name I am willing to rx. If she does not know the name what are her symtpoms

## 2013-04-14 NOTE — Telephone Encounter (Signed)
Left message on voicemail to return my call.  

## 2013-04-15 ENCOUNTER — Telehealth: Payer: Self-pay | Admitting: *Deleted

## 2013-04-15 NOTE — Telephone Encounter (Signed)
I am happy to give her a prescription for allergic symptoms, I assume that is what she is asking for? She can have Flonase 2 sprays each nostril once daily. Disp : #1 2 rf

## 2013-04-15 NOTE — Telephone Encounter (Signed)
Patient request Rx refill for "nasal spray"; there is no active and/or historical nasal spray for pt in EMR. Spoke with pt and informed of this & she stated that "it was a long time ago when last filled & that she had asked for a refill at last OV"[?]; informed pt that message would be forwarded to PCP when returning to office on Thursday morning/SLS Please Advise.

## 2013-04-16 MED ORDER — FLUTICASONE PROPIONATE 50 MCG/ACT NA SUSP: NASAL | Status: DC

## 2013-04-17 NOTE — Telephone Encounter (Signed)
Rx for nasonex sent on 04/16/13

## 2013-05-14 ENCOUNTER — Other Ambulatory Visit: Payer: Self-pay

## 2013-06-09 ENCOUNTER — Other Ambulatory Visit (HOSPITAL_COMMUNITY): Payer: Self-pay | Admitting: Obstetrics and Gynecology

## 2013-06-09 DIAGNOSIS — Z1231 Encounter for screening mammogram for malignant neoplasm of breast: Secondary | ICD-10-CM

## 2013-06-19 ENCOUNTER — Ambulatory Visit (HOSPITAL_COMMUNITY)
Admission: RE | Admit: 2013-06-19 | Discharge: 2013-06-19 | Disposition: A | Discharge status: Home / Self Care | Payer: BC Managed Care – PPO | Source: Ambulatory Visit | Attending: Obstetrics and Gynecology | Admitting: Obstetrics and Gynecology

## 2013-06-19 DIAGNOSIS — Z1231 Encounter for screening mammogram for malignant neoplasm of breast: Secondary | ICD-10-CM

## 2013-09-23 ENCOUNTER — Other Ambulatory Visit (HOSPITAL_COMMUNITY): Payer: Self-pay | Admitting: Obstetrics and Gynecology

## 2013-09-23 DIAGNOSIS — Z1231 Encounter for screening mammogram for malignant neoplasm of breast: Secondary | ICD-10-CM

## 2013-11-19 ENCOUNTER — Ambulatory Visit (HOSPITAL_COMMUNITY)
Admission: RE | Admit: 2013-11-19 | Discharge: 2013-11-19 | Disposition: A | Discharge status: Home / Self Care | Payer: BC Managed Care – PPO | Source: Ambulatory Visit | Attending: Obstetrics and Gynecology | Admitting: Obstetrics and Gynecology

## 2013-11-19 DIAGNOSIS — Z1231 Encounter for screening mammogram for malignant neoplasm of breast: Secondary | ICD-10-CM

## 2014-01-13 ENCOUNTER — Ambulatory Visit (INDEPENDENT_AMBULATORY_CARE_PROVIDER_SITE_OTHER): Payer: BC Managed Care – PPO | Admitting: Family

## 2014-01-13 ENCOUNTER — Encounter: Payer: Self-pay | Admitting: Family

## 2014-01-13 VITALS — BP 100/80 | HR 63 | Temp 98.3°F | Resp 16 | Ht 68.75 in | Wt 138.1 lb

## 2014-01-13 DIAGNOSIS — N760 Acute vaginitis: Secondary | ICD-10-CM

## 2014-01-13 NOTE — Assessment & Plan Note (Signed)
Suspect BV, wet prep obtained.  Await results for further recommendations.

## 2014-01-13 NOTE — Progress Notes (Signed)
   Subjective:    Patient ID: Gail Sanchez, female    DOB: 07/12/1970, 43 y.o.   MRN: [id number]  HPI  [name] is a 43 yr old female who presents today with chief complaint of vaginal irritation. Reports irritation has been present since 01/04/14. Slight discharge.  + odor. No new partners.  She has not tried any OTC preparations.   Review of Systems See HPI  Past Medical History  Diagnosis Date  . Fatigue 09/28/2011  . Chicken pox as a child  . Vaginitis 12/27/2011  . Preventative health care 06/09/2012  . 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 Marital Status: Married    Spouse Name: N/A    Number of Children: N/A  . Years of Education: N/A   Occupational History  . Not on file.   Social History Main Topics  . Smoking status: Never Smoker   . Smokeless tobacco: Never Used  . Alcohol Use: Yes     Comment: occ  . Drug Use: No  . Sexual Activity: Not on file   Other Topics Concern  . Not on file   Social History Narrative  . No narrative on file    Past Surgical History  Procedure Laterality Date  . Cervix frozen      Family History  Problem Relation Age of Onset  . Cancer Mother   . Hypertension Father   . Asthma Brother     No Known Allergies  Current Outpatient Prescriptions on File Prior to Visit  Medication Sig Dispense Refill  . fluticasone (FLONASE) 50 MCG/ACT nasal spray 2 sprays in each nostril once daily  16 g  2  . Prenatal Vit-Fe Fumarate-FA (PRENATAL MULTIVITAMIN) TABS Take 1 tablet by mouth daily.       No current facility-administered medications on file prior to visit.    BP 100/80  Pulse 63  Temp(Src) 98.3 F (36.8 C) (Oral)  Resp 16  Ht 5' 8.75" (1.746 m)  Wt 138 lb 1.9 oz (62.651 kg)  BMI 20.55 kg/m2  SpO2 99%  LMP 01/10/2014       Objective:   Physical Exam  Constitutional: She is oriented to person, place, and time. She appears  well-developed and well-nourished. No distress.  Cardiovascular: Normal rate.   Genitourinary:  Normal external vulva noted. No appreciable odor  Neurological: She is alert and oriented to person, place, and time.  Psychiatric: She has a normal mood and affect. Her behavior is normal. Judgment and thought content normal.          Assessment & Plan:  Declines GC/Chlamydia testing

## 2014-01-13 NOTE — Patient Instructions (Signed)
We will contact you in next few days with your results and further recommendations.

## 2014-01-13 NOTE — Progress Notes (Signed)
Pre visit review using our clinic review tool, if applicable. No additional management support is needed unless otherwise documented below in the visit note. 

## 2014-01-13 NOTE — Addendum Note (Signed)
Addended by: Mervin KungFERGERSON, Lorraine Terriquez A on: 01/13/2014 09:43 AM   Modules accepted: Orders

## 2014-01-14 ENCOUNTER — Other Ambulatory Visit: Payer: Self-pay | Admitting: Family

## 2014-01-14 LAB — WET PREP BY MOLECULAR PROBE
Candida species: NEGATIVE
GARDNERELLA VAGINALIS: POSITIVE — AB
Trichomonas vaginosis: NEGATIVE

## 2014-01-14 NOTE — Telephone Encounter (Signed)
Wet prep shows bacterial vaginosis.  I see prenatal vitamin on her list.  Is she breast feeding or trying to become pregnant?  If not, start metronidazole as pended below.  No ETOH while on metronidazole.

## 2014-01-15 ENCOUNTER — Telehealth: Payer: Self-pay | Admitting: Family Medicine

## 2014-01-15 MED ORDER — METRONIDAZOLE 500 MG PO TABS: 500.0000 mg | ORAL_TABLET | Freq: Two times a day (BID) | ORAL | Status: DC

## 2014-01-15 NOTE — Telephone Encounter (Signed)
Patient is requesting results from last visit

## 2014-01-15 NOTE — Telephone Encounter (Signed)
Please advise 

## 2014-01-15 NOTE — Telephone Encounter (Signed)
Spoke with pt, advised pt of results. She requests that we call flagyl in to [facility] [place] [phone] Rx called in.  Pt advised not to drink alcohol while taking this med. Reports not pregnant or breast feeding. Reports taking MVI once daily.

## 2014-02-15 ENCOUNTER — Ambulatory Visit (INDEPENDENT_AMBULATORY_CARE_PROVIDER_SITE_OTHER): Payer: BC Managed Care – PPO | Admitting: Family Medicine

## 2014-02-15 ENCOUNTER — Encounter: Payer: BC Managed Care – PPO | Admitting: Family Medicine

## 2014-02-15 ENCOUNTER — Encounter: Payer: Self-pay | Admitting: Family Medicine

## 2014-02-15 VITALS — BP 104/70 | HR 74 | Temp 97.6°F | Ht 68.75 in | Wt 139.1 lb

## 2014-02-15 DIAGNOSIS — T7840XD Allergy, unspecified, subsequent encounter: Secondary | ICD-10-CM

## 2014-02-15 DIAGNOSIS — Z0000 Encounter for general adult medical examination without abnormal findings: Secondary | ICD-10-CM

## 2014-02-15 LAB — HEPATIC FUNCTION PANEL
ALT: 10 U/L (ref 0–35)
AST: 16 U/L (ref 0–37)
Albumin: 4.2 g/dL (ref 3.5–5.2)
Alkaline Phosphatase: 55 U/L (ref 39–117)
Bilirubin, Direct: 0.1 mg/dL (ref 0.0–0.3)
Indirect Bilirubin: 0.5 mg/dL (ref 0.2–1.2)
Total Bilirubin: 0.6 mg/dL (ref 0.2–1.2)
Total Protein: 6.9 g/dL (ref 6.0–8.3)

## 2014-02-15 LAB — CBC
HCT: 36 % (ref 36.0–46.0)
HEMOGLOBIN: 12.6 g/dL (ref 12.0–15.0)
MCH: 29.6 pg (ref 26.0–34.0)
MCHC: 35 g/dL (ref 30.0–36.0)
MCV: 84.7 fL (ref 78.0–100.0)
Platelets: 146 10*3/uL — ABNORMAL LOW (ref 150–400)
RBC: 4.25 MIL/uL (ref 3.87–5.11)
RDW: 12.8 % (ref 11.5–15.5)
WBC: 4.3 10*3/uL (ref 4.0–10.5)

## 2014-02-15 LAB — RENAL FUNCTION PANEL
Albumin: 4.2 g/dL (ref 3.5–5.2)
BUN: 8 mg/dL (ref 6–23)
CO2: 27 mEq/L (ref 19–32)
Calcium: 9.3 mg/dL (ref 8.4–10.5)
Chloride: 103 mEq/L (ref 96–112)
Creat: 0.79 mg/dL (ref 0.50–1.10)
Glucose, Bld: 76 mg/dL (ref 70–99)
Phosphorus: 3.1 mg/dL (ref 2.3–4.6)
Potassium: 3.9 mEq/L (ref 3.5–5.3)
Sodium: 139 mEq/L (ref 135–145)

## 2014-02-15 LAB — LIPID PANEL
CHOL/HDL RATIO: 2.2 ratio
CHOLESTEROL: 156 mg/dL (ref 0–200)
HDL: 70 mg/dL (ref >39)
LDL CALC: 80 mg/dL (ref 0–99)
Triglycerides: 30 mg/dL (ref <150)
VLDL: 6 mg/dL (ref 0–40)

## 2014-02-15 LAB — TSH: TSH: 1.564 u[IU]/mL (ref 0.350–4.500)

## 2014-02-15 NOTE — Progress Notes (Signed)
Patient ID: Gail Sanchez, female   DOB: 1971/03/03, 43 y.o.   MRN: [id number] Richrd Humblesntionette C Kakar [id number] 1971/03/03 02/15/2014      Progress Note-Follow Up  Subjective  Chief Complaint  Chief Complaint  Patient presents with  . Annual Exam    physical    HPI  Patient is a 43 year old female in today for routine medical care.  In today for a radial exam feeling well. Did have a recent episode of conjunctivitis which has resolved but she has been left with dry itchy eyes. She does knowledge some minor nasal irritation and postnasal drip as well. Has not been using her Flonase or antihistamines routinely. Otherwise she feels well. No recent illness. [facility]. Denies CP/palp/SOB/HA/congestion/fevers/GI or GU c/o. Taking meds as prescribed  Past Medical History  Diagnosis Date  . Fatigue 09/28/2011  . Chicken pox as a child  . Vaginitis 12/27/2011  . Preventative health care 06/09/2012  . 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 Cervix frozen      Family History  Problem Relation Age of Onset  . Cancer Mother   . Hypertension Father   . Asthma Brother     History   Social History  . Marital Status: Married    Spouse Name: N/A    Number of Children: N/A  . Years of Education: N/A   Occupational History  . Not on file.   Social History Main Topics  . Smoking status: Never Smoker   . Smokeless tobacco: Never Used  . Alcohol Use: Yes     Comment: occ  . Drug Use: No  . Sexual Activity: Yes     Comment: lives with husband and children, no dietary restrictions, exercises each week   Other Topics Concern  . Not on file   Social History Narrative  . No narrative on file    Current Outpatient Prescriptions on File Prior to Visit  Medication Sig Dispense Refill  . fluticasone  (FLONASE) 50 MCG/ACT nasal spray 2 sprays in each nostril once daily  16 g  2  . Multiple Vitamin (MULTIVITAMIN) tablet Take 1 tablet by mouth daily.       No current facility-administered medications on file prior to visit.    No Known Allergies  Review of Systems  Review of Systems  Constitutional: Negative for fever, chills and malaise/fatigue.  HENT: Positive for congestion. Negative for hearing loss and nosebleeds.   Eyes: Negative for blurred vision, pain and discharge.  Respiratory: Negative for cough, sputum production, shortness of breath and wheezing.   Cardiovascular: Negative for chest pain, palpitations and leg swelling.  Gastrointestinal: Negative for heartburn, nausea, vomiting, abdominal pain, diarrhea, constipation and blood in stool.  Genitourinary: Negative for dysuria, urgency, frequency and hematuria.  Musculoskeletal: Negative for back pain, falls and myalgias.  Skin: Negative for rash.  Neurological: Negative for dizziness, tremors, sensory change, focal weakness, loss of consciousness, weakness and headaches.  Endo/Heme/Allergies: Negative for polydipsia. Does not bruise/bleed easily.  Psychiatric/Behavioral: Negative for depression and suicidal ideas. The patient is not nervous/anxious and does not have insomnia.     Objective  BP 104/70  Pulse 74  Temp(Src) 97.6 F (36.4 C) (Oral)  Ht 5' 8.75" (1.746 m)  Wt 139 lb 1.9 oz (63.104 kg)  BMI 20.70 kg/m2  SpO2 96%  LMP 02/01/2014  Physical Exam  Physical Exam  Constitutional: She is oriented to person, place, and time and well-developed, well-nourished, and in no distress. No distress.  HENT:  Head: Normocephalic and atraumatic.  Right Ear: External ear normal.  Left Ear: External ear normal.  Nose: Nose normal.  Mouth/Throat: Oropharynx is clear and moist. No oropharyngeal exudate.  Eyes: Conjunctivae are normal. Pupils are equal, round, and reactive to light. Right eye exhibits no discharge. Left  eye exhibits no discharge. No scleral icterus.  Neck: Normal range of motion. Neck supple. No thyromegaly present.  Cardiovascular: Normal rate, regular rhythm, normal heart sounds and intact distal pulses.   No murmur heard. Pulmonary/Chest: Effort normal and breath sounds normal. No respiratory distress. She has no wheezes. She has no rales.  Abdominal: Soft. Bowel sounds are normal. She exhibits no distension and no mass. There is no tenderness.  Musculoskeletal: Normal range of motion. She exhibits no edema and no tenderness.  Lymphadenopathy:    She has no cervical adenopathy.  Neurological: She is alert and oriented to person, place, and time. She has normal reflexes. No cranial nerve deficit. Coordination normal.  Skin: Skin is warm and dry. No rash noted. She is not diaphoretic.  Psychiatric: Mood, memory and affect normal.    Lab Results  Component Value Date   TSH 0.76 05/27/2012   Lab Results  Component Value Date   WBC 5.8 05/27/2012   HGB 13.3 05/27/2012   HCT 40.0 05/27/2012   MCV 91.2 05/27/2012   PLT 138.0* 05/27/2012   Lab Results  Component Value Date   CREATININE 0.90 01/14/2013   BUN 10 01/14/2013   NA 139 01/14/2013   K 4.1 01/14/2013   CL 102 01/14/2013   CO2 30 01/14/2013   Lab Results  Component Value Date   ALT 13 05/27/2012   AST 17 05/27/2012   ALKPHOS 54 05/27/2012   BILITOT 0.9 05/27/2012   Lab Results  Component Value Date   CHOL 185 05/27/2012   Lab Results  Component Value Date   HDL 71.80 05/27/2012   Lab Results  Component Value Date   LDLCALC 100* 05/27/2012   Lab Results  Component Value Date   TRIG 64.0 05/27/2012   Lab Results  Component Value Date   CHOLHDL 3 05/27/2012     Assessment & Plan  Allergic state Has eye and nose symptoms, use Flonase daily if no improvement add Zyrtec daily, increase hydration and add a fatty acid supplement such as Flaxseed oil or krill oil caps daily. Seeing her eye doctor soon, [facility] [place]  Preventative health care Patient encouraged to maintain heart healthy diet, regular exercise, adequate sleep. Consider daily probiotics. Take medications as prescribed. Fasting labs run today, follows with [facility]

## 2014-02-15 NOTE — Assessment & Plan Note (Signed)
Has eye and nose symptoms, use Flonase daily if no improvement add Zyrtec daily, increase hydration and add a fatty acid supplement such as Flaxseed oil or krill oil caps daily. [place] doctor soon[place] [facility] [place]

## 2014-02-15 NOTE — Progress Notes (Signed)
Pre visit review using our clinic review tool, if applicable. No additional management support is needed unless otherwise documented below in the visit note. 

## 2014-02-15 NOTE — Patient Instructions (Signed)

## 2014-02-15 NOTE — Assessment & Plan Note (Signed)
Patient encouraged to maintain heart healthy diet, regular exercise, adequate sleep. Consider daily probiotics. Take medications as prescribed. [facility] labs run today, [facility]

## 2014-02-25 ENCOUNTER — Encounter: Payer: Self-pay | Admitting: Family Medicine

## 2014-04-29 ENCOUNTER — Encounter: Payer: Self-pay | Admitting: Family Medicine

## 2014-05-10 ENCOUNTER — Encounter: Payer: Self-pay | Admitting: Family Medicine

## 2014-08-10 ENCOUNTER — Other Ambulatory Visit: Payer: Self-pay | Admitting: Family Medicine

## 2014-08-10 DIAGNOSIS — Z1231 Encounter for screening mammogram for malignant neoplasm of breast: Secondary | ICD-10-CM

## 2014-09-14 ENCOUNTER — Encounter: Payer: Self-pay | Admitting: Nurse Practitioner

## 2014-09-14 ENCOUNTER — Ambulatory Visit (INDEPENDENT_AMBULATORY_CARE_PROVIDER_SITE_OTHER): Payer: BC Managed Care – PPO | Admitting: Nurse Practitioner

## 2014-09-14 VITALS — BP 124/72 | HR 77 | Temp 97.7°F | Ht 68.75 in | Wt 141.0 lb

## 2014-09-14 DIAGNOSIS — R300 Dysuria: Secondary | ICD-10-CM

## 2014-09-14 DIAGNOSIS — N3000 Acute cystitis without hematuria: Secondary | ICD-10-CM

## 2014-09-14 LAB — POCT URINALYSIS DIPSTICK
BILIRUBIN UA: NEGATIVE
Glucose, UA: NEGATIVE
Ketones, UA: NEGATIVE
NITRITE UA: POSITIVE
Protein, UA: NEGATIVE
RBC UA: NEGATIVE
Spec Grav, UA: 1.015
Urobilinogen, UA: NEGATIVE
pH, UA: 6.5

## 2014-09-14 LAB — POCT UA - MICROSCOPIC ONLY
CRYSTALS, UR, HPF, POC: NEGATIVE
Casts, Ur, LPF, POC: NEGATIVE
Mucus, UA: NEGATIVE
RBC, URINE, MICROSCOPIC: NEGATIVE
Yeast, UA: NEGATIVE

## 2014-09-14 MED ORDER — CIPROFLOXACIN HCL 500 MG PO TABS: 500.0000 mg | ORAL_TABLET | Freq: Two times a day (BID) | ORAL | Status: DC

## 2014-09-14 NOTE — Progress Notes (Signed)
  Subjective:    Gail Sanchez is a 44 y.o. female who complains of dysuria, foul smelling urine, frequency and urgency. She has had symptoms for 3 days. Patient also complains of back pain and Chronic back pain.. Patient denies congestion and fever. Patient does not have a history of recurrent UTI. Patient does not have a history of pyelonephritis.   The following portions of the patient's history were reviewed and updated as appropriate: allergies, current medications, past family history, past medical history, past social history, past surgical history and problem list.  Review of Systems Pertinent items are noted in HPI.    Objective:    BP 124/72 mmHg  Pulse 77  Temp(Src) 97.7 F (36.5 C) (Oral)  Ht 5' 8.75" (1.746 m)  Wt 141 lb (63.957 kg)  BMI 20.98 kg/m2  LMP 09/10/2014 General appearance: alert, cooperative and appears stated age Back: symmetric, no curvature. ROM normal. No CVA tenderness. Lungs: clear to auscultation bilaterally Heart: regular rate and rhythm, S1, S2 normal, no murmur, click, rub or gallop Abdomen: soft, non-tender; bowel sounds normal; no masses,  no organomegaly  Laboratory:    Results for orders placed or performed in visit on 09/14/14  POCT UA - Microscopic Only  Result Value Ref Range   WBC, Ur, HPF, POC 20-30    RBC, urine, microscopic neg    Bacteria, U Microscopic many    Mucus, UA neg    Epithelial cells, urine per micros mod    Crystals, Ur, HPF, POC neg    Casts, Ur, LPF, POC neg    Yeast, UA neg   POCT urinalysis dipstick  Result Value Ref Range   Color, UA straw    Clarity, UA cloudy    Glucose, UA neg    Bilirubin, UA neg    Ketones, UA neg    Spec Grav, UA 1.015    Blood, UA neg    pH, UA 6.5    Protein, UA neg    Urobilinogen, UA negative    Nitrite, UA pos    Leukocytes, UA large (3+)     Assessment:    Acute cystitis     Plan:   1. Dysuria   2. Acute cystitis without hematuria    Orders Placed This  Encounter  Procedures  . POCT UA - Microscopic Only  . POCT urinalysis dipstick   Meds ordered this encounter  Medications  . ciprofloxacin (CIPRO) 500 MG tablet    Sig: Take 1 tablet (500 mg total) by mouth 2 (two) times daily.    Dispense:  14 tablet    Refill:  0    Order Specific Question:  Supervising Provider    Answer:  Ernestina PennaMOORE, DONALD W [[id number]]    Increase fluid Take medication as prescribed Take shower not bath Wear cotton underwear Yogurt to prevent yeast infection RTO prn  Mary-Margaret Daphine DeutscherMartin, FNP

## 2014-09-14 NOTE — Patient Instructions (Signed)

## 2014-11-22 ENCOUNTER — Ambulatory Visit (HOSPITAL_COMMUNITY): Payer: BC Managed Care – PPO

## 2014-11-23 ENCOUNTER — Ambulatory Visit (HOSPITAL_COMMUNITY)
Admission: RE | Admit: 2014-11-23 | Discharge: 2014-11-23 | Disposition: A | Discharge status: Home / Self Care | Payer: BC Managed Care – PPO | Source: Ambulatory Visit | Attending: Family Medicine | Admitting: Family Medicine

## 2014-11-23 DIAGNOSIS — Z1231 Encounter for screening mammogram for malignant neoplasm of breast: Secondary | ICD-10-CM | POA: Insufficient documentation

## 2015-05-16 ENCOUNTER — Telehealth: Payer: Self-pay

## 2015-05-16 NOTE — Telephone Encounter (Signed)
Pre visit call completed 

## 2015-05-17 ENCOUNTER — Other Ambulatory Visit: Payer: Self-pay | Admitting: Family Medicine

## 2015-05-17 ENCOUNTER — Encounter: Payer: Self-pay | Admitting: Family Medicine

## 2015-05-17 ENCOUNTER — Ambulatory Visit (HOSPITAL_BASED_OUTPATIENT_CLINIC_OR_DEPARTMENT_OTHER)
Admission: RE | Admit: 2015-05-17 | Discharge: 2015-05-17 | Disposition: A | Discharge status: Home / Self Care | Payer: BC Managed Care – PPO | Source: Ambulatory Visit | Attending: Family Medicine | Admitting: Family Medicine

## 2015-05-17 ENCOUNTER — Ambulatory Visit (INDEPENDENT_AMBULATORY_CARE_PROVIDER_SITE_OTHER): Payer: BC Managed Care – PPO | Admitting: Family Medicine

## 2015-05-17 VITALS — BP 104/72 | HR 74 | Temp 97.7°F | Ht 69.0 in | Wt 143.5 lb

## 2015-05-17 DIAGNOSIS — Z0000 Encounter for general adult medical examination without abnormal findings: Secondary | ICD-10-CM | POA: Diagnosis not present

## 2015-05-17 DIAGNOSIS — D696 Thrombocytopenia, unspecified: Secondary | ICD-10-CM | POA: Diagnosis not present

## 2015-05-17 DIAGNOSIS — Z1231 Encounter for screening mammogram for malignant neoplasm of breast: Secondary | ICD-10-CM

## 2015-05-17 DIAGNOSIS — M545 Low back pain, unspecified: Secondary | ICD-10-CM

## 2015-05-17 DIAGNOSIS — M47897 Other spondylosis, lumbosacral region: Secondary | ICD-10-CM | POA: Insufficient documentation

## 2015-05-17 NOTE — Patient Instructions (Addendum)
Salon pas patches or gel Can alternate moist heat and ice, gentle stretcing  Preventive Care for Adults, Female A healthy lifestyle and preventive care can promote health and wellness. Preventive health guidelines for women include the following key practices.  A routine yearly physical is a good way to check with your health care provider about your health and preventive screening. It is a chance to share any concerns and updates on your health and to receive a thorough exam.  Visit your dentist for a routine exam and preventive care every 6 months. Brush your teeth twice a day and floss once a day. Good oral hygiene prevents tooth decay and gum disease.  The frequency of eye exams is based on your age, health, family medical history, use of contact lenses, and other factors. Follow your health care provider's recommendations for frequency of eye exams.  Eat a healthy diet. Foods like vegetables, fruits, whole grains, low-fat dairy products, and lean protein foods contain the nutrients you need without too many calories. Decrease your intake of foods high in solid fats, added sugars, and salt. Eat the right amount of calories for you.Get information about a proper diet from your health care provider, if necessary.  Regular physical exercise is one of the most important things you can do for your health. Most adults should get at least 150 minutes of moderate-intensity exercise (any activity that increases your heart rate and causes you to sweat) each week. In addition, most adults need muscle-strengthening exercises on 2 or more days a week.  Maintain a healthy weight. The body mass index (BMI) is a screening tool to identify possible weight problems. It provides an estimate of body fat based on height and weight. Your health care provider can find your BMI and can help you achieve or maintain a healthy weight.For adults 20 years and older:  A BMI below 18.5 is considered underweight.  A BMI  of 18.5 to 24.9 is normal.  A BMI of 25 to 29.9 is considered overweight.  A BMI of 30 and above is considered obese.  Maintain normal blood lipids and cholesterol levels by exercising and minimizing your intake of saturated fat. Eat a balanced diet with plenty of fruit and vegetables. Blood tests for lipids and cholesterol should begin at age 73 and be repeated every 5 years. If your lipid or cholesterol levels are high, you are over 50, or you are at high risk for heart disease, you may need your cholesterol levels checked more frequently.Ongoing high lipid and cholesterol levels should be treated with medicines if diet and exercise are not working.  If you smoke, find out from your health care provider how to quit. If you do not use tobacco, do not start.  Lung cancer screening is recommended for adults aged 19-80 years who are at high risk for developing lung cancer because of a history of smoking. A yearly low-dose CT scan of the lungs is recommended for people who have at least a 30-pack-year history of smoking and are a current smoker or have quit within the past 15 years. A pack year of smoking is smoking an average of 1 pack of cigarettes a day for 1 year (for example: 1 pack a day for 30 years or 2 packs a day for 15 years). Yearly screening should continue until the smoker has stopped smoking for at least 15 years. Yearly screening should be stopped for people who develop a health problem that would prevent them from having lung  cancer treatment.  If you are pregnant, do not drink alcohol. If you are breastfeeding, be very cautious about drinking alcohol. If you are not pregnant and choose to drink alcohol, do not have more than 1 drink per day. One drink is considered to be 12 ounces (355 mL) of beer, 5 ounces (148 mL) of wine, or 1.5 ounces (44 mL) of liquor.  Avoid use of street drugs. Do not share needles with anyone. Ask for help if you need support or instructions about stopping the  use of drugs.  High blood pressure causes heart disease and increases the risk of stroke. Your blood pressure should be checked at least every 1 to 2 years. Ongoing high blood pressure should be treated with medicines if weight loss and exercise do not work.  If you are 36-34 years old, ask your health care provider if you should take aspirin to prevent strokes.  Diabetes screening is done by taking a blood sample to check your blood glucose level after you have not eaten for a certain period of time (fasting). If you are not overweight and you do not have risk factors for diabetes, you should be screened once every 3 years starting at age 74. If you are overweight or obese and you are 67-36 years of age, you should be screened for diabetes every year as part of your cardiovascular risk assessment.  Breast cancer screening is essential preventive care for women. You should practice "breast self-awareness." This means understanding the normal appearance and feel of your breasts and may include breast self-examination. Any changes detected, no matter how small, should be reported to a health care provider. Women in their 72s and 30s should have a clinical breast exam (CBE) by a health care provider as part of a regular health exam every 1 to 3 years. After age 69, women should have a CBE every year. Starting at age 43, women should consider having a mammogram (breast X-ray test) every year. Women who have a family history of breast cancer should talk to their health care provider about genetic screening. Women at a high risk of breast cancer should talk to their health care providers about having an MRI and a mammogram every year.  Breast cancer gene (BRCA)-related cancer risk assessment is recommended for women who have family members with BRCA-related cancers. BRCA-related cancers include breast, ovarian, tubal, and peritoneal cancers. Having family members with these cancers may be associated with an  increased risk for harmful changes (mutations) in the breast cancer genes BRCA1 and BRCA2. Results of the assessment will determine the need for genetic counseling and BRCA1 and BRCA2 testing.  Your health care provider may recommend that you be screened regularly for cancer of the pelvic organs (ovaries, uterus, and vagina). This screening involves a pelvic examination, including checking for microscopic changes to the surface of your cervix (Pap test). You may be encouraged to have this screening done every 3 years, beginning at age 54.  For women ages 71-65, health care providers may recommend pelvic exams and Pap testing every 3 years, or they may recommend the Pap and pelvic exam, combined with testing for human papilloma virus (HPV), every 5 years. Some types of HPV increase your risk of cervical cancer. Testing for HPV may also be done on women of any age with unclear Pap test results.  Other health care providers may not recommend any screening for nonpregnant women who are considered low risk for pelvic cancer and who do not have  symptoms. Ask your health care provider if a screening pelvic exam is right for you.  If you have had past treatment for cervical cancer or a condition that could lead to cancer, you need Pap tests and screening for cancer for at least 20 years after your treatment. If Pap tests have been discontinued, your risk factors (such as having a new sexual partner) need to be reassessed to determine if screening should resume. Some women have medical problems that increase the chance of getting cervical cancer. In these cases, your health care provider may recommend more frequent screening and Pap tests.  Colorectal cancer can be detected and often prevented. Most routine colorectal cancer screening begins at the age of 10 years and continues through age 44 years. However, your health care provider may recommend screening at an earlier age if you have risk factors for colon  cancer. On a yearly basis, your health care provider may provide home test kits to check for hidden blood in the stool. Use of a small camera at the end of a tube, to directly examine the colon (sigmoidoscopy or colonoscopy), can detect the earliest forms of colorectal cancer. Talk to your health care provider about this at age 77, when routine screening begins. Direct exam of the colon should be repeated every 5-10 years through age 57 years, unless early forms of precancerous polyps or small growths are found.  People who are at an increased risk for hepatitis B should be screened for this virus. You are considered at high risk for hepatitis B if:  You were born in a country where hepatitis B occurs often. Talk with your health care provider about which countries are considered high risk.  Your parents were born in a high-risk country and you have not received a shot to protect against hepatitis B (hepatitis B vaccine).  You have HIV or AIDS.  You use needles to inject street drugs.  You live with, or have sex with, someone who has hepatitis B.  You get hemodialysis treatment.  You take certain medicines for conditions like cancer, organ transplantation, and autoimmune conditions.  Hepatitis C blood testing is recommended for all people born from [birth year] through 1964 and any individual with known risks for hepatitis C.  Practice safe sex. Use condoms and avoid high-risk sexual practices to reduce the spread of sexually transmitted infections (STIs). STIs include gonorrhea, chlamydia, syphilis, trichomonas, herpes, HPV, and human immunodeficiency virus (HIV). Herpes, HIV, and HPV are viral illnesses that have no cure. They can result in disability, cancer, and death.  You should be screened for sexually transmitted illnesses (STIs) including gonorrhea and chlamydia if:  You are sexually active and are younger than 24 years.  You are older than 24 years and your health care provider tells you  that you are at risk for this type of infection.  Your sexual activity has changed since you were last screened and you are at an increased risk for chlamydia or gonorrhea. Ask your health care provider if you are at risk.  If you are at risk of being infected with HIV, it is recommended that you take a prescription medicine daily to prevent HIV infection. This is called preexposure prophylaxis (PrEP). You are considered at risk if:  You are sexually active and do not regularly use condoms or know the HIV status of your partner(s).  You take drugs by injection.  You are sexually active with a partner who has HIV.  Talk with your health care  provider about whether you are at high risk of being infected with HIV. If you choose to begin PrEP, you should first be tested for HIV. You should then be tested every 3 months for as long as you are taking PrEP.  Osteoporosis is a disease in which the bones lose minerals and strength with aging. This can result in serious bone fractures or breaks. The risk of osteoporosis can be identified using a bone density scan. Women ages 35 years and over and women at risk for fractures or osteoporosis should discuss screening with their health care providers. Ask your health care provider whether you should take a calcium supplement or vitamin D to reduce the rate of osteoporosis.  Menopause can be associated with physical symptoms and risks. Hormone replacement therapy is available to decrease symptoms and risks. You should talk to your health care provider about whether hormone replacement therapy is right for you.  Use sunscreen. Apply sunscreen liberally and repeatedly throughout the day. You should seek shade when your shadow is shorter than you. Protect yourself by wearing long sleeves, pants, a wide-brimmed hat, and sunglasses year round, whenever you are outdoors.  Once a month, do a whole body skin exam, using a mirror to look at the skin on your back. Tell  your health care provider of new moles, moles that have irregular borders, moles that are larger than a pencil eraser, or moles that have changed in shape or color.  Stay current with required vaccines (immunizations).  Influenza vaccine. All adults should be immunized every year.  Tetanus, diphtheria, and acellular pertussis (Td, Tdap) vaccine. Pregnant women should receive 1 dose of Tdap vaccine during each pregnancy. The dose should be obtained regardless of the length of time since the last dose. Immunization is preferred during the 27th-36th week of gestation. An adult who has not previously received Tdap or who does not know her vaccine status should receive 1 dose of Tdap. This initial dose should be followed by tetanus and diphtheria toxoids (Td) booster doses every 10 years. Adults with an unknown or incomplete history of completing a 3-dose immunization series with Td-containing vaccines should begin or complete a primary immunization series including a Tdap dose. Adults should receive a Td booster every 10 years.  Varicella vaccine. An adult without evidence of immunity to varicella should receive 2 doses or a second dose if she has previously received 1 dose. Pregnant females who do not have evidence of immunity should receive the first dose after pregnancy. This first dose should be obtained before leaving the health care facility. The second dose should be obtained 4-8 weeks after the first dose.  Human papillomavirus (HPV) vaccine. Females aged 13-26 years who have not received the vaccine previously should obtain the 3-dose series. The vaccine is not recommended for use in pregnant females. However, pregnancy testing is not needed before receiving a dose. If a female is found to be pregnant after receiving a dose, no treatment is needed. In that case, the remaining doses should be delayed until after the pregnancy. Immunization is recommended for any person with an immunocompromised  condition through the age of 26 years if she did not get any or all doses earlier. During the 3-dose series, the second dose should be obtained 4-8 weeks after the first dose. The third dose should be obtained 24 weeks after the first dose and 16 weeks after the second dose.  Zoster vaccine. One dose is recommended for adults aged 57 years or older  unless certain conditions are present.  Measles, mumps, and rubella (MMR) vaccine. Adults born before [birth year] generally are considered immune to measles and mumps. Adults born in [birth year] or later should have 1 or more doses of MMR vaccine unless there is a contraindication to the vaccine or there is laboratory evidence of immunity to each of the three diseases. A routine second dose of MMR vaccine should be obtained at least 28 days after the first dose for students attending postsecondary schools, health care workers, or international travelers. People who received inactivated measles vaccine or an unknown type of measles vaccine during 1963-1967 should receive 2 doses of MMR vaccine. People who received inactivated mumps vaccine or an unknown type of mumps vaccine before 1979 and are at high risk for mumps infection should consider immunization with 2 doses of MMR vaccine. For females of childbearing age, rubella immunity should be determined. If there is no evidence of immunity, females who are not pregnant should be vaccinated. If there is no evidence of immunity, females who are pregnant should delay immunization until after pregnancy. Unvaccinated health care workers born before [birth year] who lack laboratory evidence of measles, mumps, or rubella immunity or laboratory confirmation of disease should consider measles and mumps immunization with 2 doses of MMR vaccine or rubella immunization with 1 dose of MMR vaccine.  Pneumococcal 13-valent conjugate (PCV13) vaccine. When indicated, a person who is uncertain of his immunization history and has no record of immunization  should receive the PCV13 vaccine. All adults 22 years of age and older should receive this vaccine. An adult aged 87 years or older who has certain medical conditions and has not been previously immunized should receive 1 dose of PCV13 vaccine. This PCV13 should be followed with a dose of pneumococcal polysaccharide (PPSV23) vaccine. Adults who are at high risk for pneumococcal disease should obtain the PPSV23 vaccine at least 8 weeks after the dose of PCV13 vaccine. Adults older than 43 years of age who have normal immune system function should obtain the PPSV23 vaccine dose at least 1 year after the dose of PCV13 vaccine.  Pneumococcal polysaccharide (PPSV23) vaccine. When PCV13 is also indicated, PCV13 should be obtained first. All adults aged 31 years and older should be immunized. An adult younger than age 11 years who has certain medical conditions should be immunized. Any person who resides in a nursing home or long-term care facility should be immunized. An adult smoker should be immunized. People with an immunocompromised condition and certain other conditions should receive both PCV13 and PPSV23 vaccines. People with human immunodeficiency virus (HIV) infection should be immunized as soon as possible after diagnosis. Immunization during chemotherapy or radiation therapy should be avoided. Routine use of PPSV23 vaccine is not recommended for American Indians, [place] Natives, or people younger than 65 years unless there are medical conditions that require PPSV23 vaccine. When indicated, people who have unknown immunization and have no record of immunization should receive PPSV23 vaccine. One-time revaccination 5 years after the first dose of PPSV23 is recommended for people aged 19-64 years who have chronic kidney failure, nephrotic syndrome, asplenia, or immunocompromised conditions. People who received 1-2 doses of PPSV23 before age 38 years should receive another dose of PPSV23 vaccine at age 77 years  or later if at least 5 years have passed since the previous dose. Doses of PPSV23 are not needed for people immunized with PPSV23 at or after age 45 years.  Meningococcal vaccine. Adults with asplenia or persistent complement component deficiencies should  receive 2 doses of quadrivalent meningococcal conjugate (MenACWY-D) vaccine. The doses should be obtained at least 2 months apart. Microbiologists working with certain meningococcal bacteria, [place] recruits, people at risk during an outbreak, and people who travel to or live in countries with a high rate of meningitis should be immunized. A first-year college student up through age 15 years who is living in a residence hall should receive a dose if she did not receive a dose on or after her 16th birthday. Adults who have certain high-risk conditions should receive one or more doses of vaccine.  Hepatitis A vaccine. Adults who wish to be protected from this disease, have certain high-risk conditions, work with hepatitis A-infected animals, work in hepatitis A research labs, or travel to or work in countries with a high rate of hepatitis A should be immunized. Adults who were previously unvaccinated and who anticipate close contact with an international adoptee during the first 60 days after arrival in the [place] from a country with a high rate of hepatitis A should be immunized.  Hepatitis B vaccine. Adults who wish to be protected from this disease, have certain high-risk conditions, may be exposed to blood or other infectious body fluids, are household contacts or sex partners of hepatitis B positive people, are clients or workers in certain care facilities, or travel to or work in countries with a high rate of hepatitis B should be immunized.  Haemophilus influenzae type b (Hib) vaccine. A previously unvaccinated person with asplenia or sickle cell disease or having a scheduled splenectomy should receive 1 dose of Hib vaccine. Regardless of  previous immunization, a recipient of a hematopoietic stem cell transplant should receive a 3-dose series 6-12 months after her successful transplant. Hib vaccine is not recommended for adults with HIV infection. Preventive Services / Frequency Ages 57 to 52 years  Blood pressure check.** / Every 3-5 years.  Lipid and cholesterol check.** / Every 5 years beginning at age 12.  Clinical breast exam.** / Every 3 years for women in their 48s and 60s.  BRCA-related cancer risk assessment.** / For women who have family members with a BRCA-related cancer (breast, ovarian, tubal, or peritoneal cancers).  Pap test.** / Every 2 years from ages 37 through 24. Every 3 years starting at age 8 through age 48 or 14 with a history of 3 consecutive normal Pap tests.  HPV screening.** / Every 3 years from ages 76 through ages 74 to 34 with a history of 3 consecutive normal Pap tests.  Hepatitis C blood test.** / For any individual with known risks for hepatitis C.  Skin self-exam. / Monthly.  Influenza vaccine. / Every year.  Tetanus, diphtheria, and acellular pertussis (Tdap, Td) vaccine.** / Consult your health care provider. Pregnant women should receive 1 dose of Tdap vaccine during each pregnancy. 1 dose of Td every 10 years.  Varicella vaccine.** / Consult your health care provider. Pregnant females who do not have evidence of immunity should receive the first dose after pregnancy.  HPV vaccine. / 3 doses over 6 months, if 58 and younger. The vaccine is not recommended for use in pregnant females. However, pregnancy testing is not needed before receiving a dose.  Measles, mumps, rubella (MMR) vaccine.** / You need at least 1 dose of MMR if you were born in 1957 or later. You may also need a 2nd dose. For females of childbearing age, rubella immunity should be determined. If there is no evidence of immunity, females who are not  pregnant should be vaccinated. If there is no evidence of immunity,  females who are pregnant should delay immunization until after pregnancy.  Pneumococcal 13-valent conjugate (PCV13) vaccine.** / Consult your health care provider.  Pneumococcal polysaccharide (PPSV23) vaccine.** / 1 to 2 doses if you smoke cigarettes or if you have certain conditions.  Meningococcal vaccine.** / 1 dose if you are age 74 to 8 years and a Market researcher living in a residence hall, or have one of several medical conditions, you need to get vaccinated against meningococcal disease. You may also need additional booster doses.  Hepatitis A vaccine.** / Consult your health care provider.  Hepatitis B vaccine.** / Consult your health care provider.  Haemophilus influenzae type b (Hib) vaccine.** / Consult your health care provider. Ages 34 to 68 years  Blood pressure check.** / Every year.  Lipid and cholesterol check.** / Every 5 years beginning at age 56 years.  Lung cancer screening. / Every year if you are aged 88-80 years and have a 30-pack-year history of smoking and currently smoke or have quit within the past 15 years. Yearly screening is stopped once you have quit smoking for at least 15 years or develop a health problem that would prevent you from having lung cancer treatment.  Clinical breast exam.** / Every year after age 65 years.  BRCA-related cancer risk assessment.** / For women who have family members with a BRCA-related cancer (breast, ovarian, tubal, or peritoneal cancers).  Mammogram.** / Every year beginning at age 90 years and continuing for as long as you are in good health. Consult with your health care provider.  Pap test.** / Every 3 years starting at age 40 years through age 54 or 32 years with a history of 3 consecutive normal Pap tests.  HPV screening.** / Every 3 years from ages 77 years through ages 86 to 39 years with a history of 3 consecutive normal Pap tests.  Fecal occult blood test (FOBT) of stool. / Every year beginning at  age 41 years and continuing until age 31 years. You may not need to do this test if you get a colonoscopy every 10 years.  Flexible sigmoidoscopy or colonoscopy.** / Every 5 years for a flexible sigmoidoscopy or every 10 years for a colonoscopy beginning at age 38 years and continuing until age 32 years.  Hepatitis C blood test.** / For all people born from [birth year] through 1964 and any individual with known risks for hepatitis C.  Skin self-exam. / Monthly.  Influenza vaccine. / Every year.  Tetanus, diphtheria, and acellular pertussis (Tdap/Td) vaccine.** / Consult your health care provider. Pregnant women should receive 1 dose of Tdap vaccine during each pregnancy. 1 dose of Td every 10 years.  Varicella vaccine.** / Consult your health care provider. Pregnant females who do not have evidence of immunity should receive the first dose after pregnancy.  Zoster vaccine.** / 1 dose for adults aged 20 years or older.  Measles, mumps, rubella (MMR) vaccine.** / You need at least 1 dose of MMR if you were born in 1957 or later. You may also need a second dose. For females of childbearing age, rubella immunity should be determined. If there is no evidence of immunity, females who are not pregnant should be vaccinated. If there is no evidence of immunity, females who are pregnant should delay immunization until after pregnancy.  Pneumococcal 13-valent conjugate (PCV13) vaccine.** / Consult your health care provider.  Pneumococcal polysaccharide (PPSV23) vaccine.** / 1 to 2 doses  if you smoke cigarettes or if you have certain conditions.  Meningococcal vaccine.** / Consult your health care provider.  Hepatitis A vaccine.** / Consult your health care provider.  Hepatitis B vaccine.** / Consult your health care provider.  Haemophilus influenzae type b (Hib) vaccine.** / Consult your health care provider. Ages 58 years and over  Blood pressure check.** / Every year.  Lipid and cholesterol check.**  / Every 5 years beginning at age 20 years.  Lung cancer screening. / Every year if you are aged 69-80 years and have a 30-pack-year history of smoking and currently smoke or have quit within the past 15 years. Yearly screening is stopped once you have quit smoking for at least 15 years or develop a health problem that would prevent you from having lung cancer treatment.  Clinical breast exam.** / Every year after age 36 years.  BRCA-related cancer risk assessment.** / For women who have family members with a BRCA-related cancer (breast, ovarian, tubal, or peritoneal cancers).  Mammogram.** / Every year beginning at age 66 years and continuing for as long as you are in good health. Consult with your health care provider.  Pap test.** / Every 3 years starting at age 48 years through age 59 or 2 years with 3 consecutive normal Pap tests. Testing can be stopped between 65 and 70 years with 3 consecutive normal Pap tests and no abnormal Pap or HPV tests in the past 10 years.  HPV screening.** / Every 3 years from ages 68 years through ages 3 or 70 years with a history of 3 consecutive normal Pap tests. Testing can be stopped between 65 and 70 years with 3 consecutive normal Pap tests and no abnormal Pap or HPV tests in the past 10 years.  Fecal occult blood test (FOBT) of stool. / Every year beginning at age 30 years and continuing until age 67 years. You may not need to do this test if you get a colonoscopy every 10 years.  Flexible sigmoidoscopy or colonoscopy.** / Every 5 years for a flexible sigmoidoscopy or every 10 years for a colonoscopy beginning at age 11 years and continuing until age 73 years.  Hepatitis C blood test.** / For all people born from [birth year] through 1964 and any individual with known risks for hepatitis C.  Osteoporosis screening.** / A one-time screening for women ages 27 years and over and women at risk for fractures or osteoporosis.  Skin self-exam. / Monthly.  Influenza  vaccine. / Every year.  Tetanus, diphtheria, and acellular pertussis (Tdap/Td) vaccine.** / 1 dose of Td every 10 years.  Varicella vaccine.** / Consult your health care provider.  Zoster vaccine.** / 1 dose for adults aged 58 years or older.  Pneumococcal 13-valent conjugate (PCV13) vaccine.** / Consult your health care provider.  Pneumococcal polysaccharide (PPSV23) vaccine.** / 1 dose for all adults aged 34 years and older.  Meningococcal vaccine.** / Consult your health care provider.  Hepatitis A vaccine.** / Consult your health care provider.  Hepatitis B vaccine.** / Consult your health care provider.  Haemophilus influenzae type b (Hib) vaccine.** / Consult your health care provider. ** Family history and personal history of risk and conditions may change your health care provider's recommendations.   This information is not intended to replace advice given to you by your health care provider. Make sure you discuss any questions you have with your health care provider.   Document Released: 08/21/2001 Document Revised: 07/16/2014 Document Reviewed: 11/20/2010 Elsevier Interactive Patient Education 2016  [employer].

## 2015-05-17 NOTE — Progress Notes (Signed)
Pre visit review using our clinic review tool, if applicable. No additional management support is needed unless otherwise documented below in the visit note. 

## 2015-05-18 LAB — CBC
HCT: 40.3 % (ref 36.0–46.0)
Hemoglobin: 13.2 g/dL (ref 12.0–15.0)
MCHC: 32.8 g/dL (ref 30.0–36.0)
MCV: 90.3 fl (ref 78.0–100.0)
PLATELETS: 137 10*3/uL — AB (ref 150.0–400.0)
RBC: 4.46 Mil/uL (ref 3.87–5.11)
RDW: 12.1 % (ref 11.5–15.5)
WBC: 7.3 10*3/uL (ref 4.0–10.5)

## 2015-05-18 LAB — LIPID PANEL
CHOL/HDL RATIO: 2
Cholesterol: 156 mg/dL (ref 0–200)
HDL: 73.1 mg/dL (ref >39.00)
LDL Cholesterol: 76 mg/dL (ref 0–99)
NonHDL: 82.85
TRIGLYCERIDES: 35 mg/dL (ref 0.0–149.0)
VLDL: 7 mg/dL (ref 0.0–40.0)

## 2015-05-18 LAB — COMPREHENSIVE METABOLIC PANEL
ALBUMIN: 4.3 g/dL (ref 3.5–5.2)
ALT: 10 U/L (ref 0–35)
AST: 15 U/L (ref 0–37)
Alkaline Phosphatase: 50 U/L (ref 39–117)
BILIRUBIN TOTAL: 0.6 mg/dL (ref 0.2–1.2)
BUN: 11 mg/dL (ref 6–23)
CO2: 26 meq/L (ref 19–32)
CREATININE: 0.86 mg/dL (ref 0.40–1.20)
Calcium: 9.8 mg/dL (ref 8.4–10.5)
Chloride: 103 mEq/L (ref 96–112)
GFR: 76.25 mL/min (ref >60.00)
Glucose, Bld: 90 mg/dL (ref 70–99)
Potassium: 3.8 mEq/L (ref 3.5–5.1)
Sodium: 138 mEq/L (ref 135–145)
Total Protein: 7.6 g/dL (ref 6.0–8.3)

## 2015-05-18 LAB — TSH: TSH: 0.97 u[IU]/mL (ref 0.35–4.50)

## 2015-05-24 ENCOUNTER — Encounter: Payer: Self-pay | Admitting: Family Medicine

## 2015-05-24 ENCOUNTER — Other Ambulatory Visit: Payer: Self-pay | Admitting: Family Medicine

## 2015-05-24 MED ORDER — METHOCARBAMOL 500 MG PO TABS: 500.0000 mg | ORAL_TABLET | Freq: Two times a day (BID) | ORAL | Status: DC | PRN

## 2015-05-28 ENCOUNTER — Encounter: Payer: Self-pay | Admitting: Family Medicine

## 2015-05-28 DIAGNOSIS — M545 Low back pain, unspecified: Secondary | ICD-10-CM

## 2015-05-28 HISTORY — DX: Low back pain, unspecified: M54.50

## 2015-05-28 NOTE — Assessment & Plan Note (Addendum)
Encouraged moist heat and gentle stretching as tolerated. May try NSAIDs and prescription meds as directed and report if symptoms worsen or seek immediate care. Xray today reveals mild arthritic changes

## 2015-05-28 NOTE — Progress Notes (Signed)
Subjective:    Patient ID: Gail Sanchez, female    DOB: 07/12/1970, 44 y.o.   MRN: [id number]  Chief Complaint  Patient presents with  . Annual Exam    HPI Patient is in today for annual exam. Generally feels well. No recent major illness. Her greatest complaint is of intermittent low back pain. She has tried chiropractic care with no significant improvement. She denies any incontinence or radicular symptoms. No recent injury or fall. Follows with [facility] for her GYN concerns and offers none today. Denies CP/palp/SOB/HA/congestion/fevers/GI or GU c/o. Taking meds as prescribed  Past Medical History  Diagnosis Date  . Fatigue 09/28/2011  . Chicken pox as a child  . Vaginitis 12/27/2011  . Preventative health care 06/09/2012  . 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 Allergic state 06/09/2012  . Low back pain 05/28/2015    Past Surgical History  Procedure Laterality Date  . Cervix frozen      Family History  Problem Relation Age of Onset  . Cancer Mother   . Hypertension Father   . Asthma Brother     Social History   Social History  . Marital Status: Married    Spouse Name: N/A  . Number of Children: N/A  . Years of Education: N/A   Occupational History  . Not on file.   Social History Main Topics  . Smoking status: Never Smoker   . Smokeless tobacco: Never Used  . Alcohol Use: Yes     Comment: occ  . Drug Use: No  . Sexual Activity: Yes     Comment: lives with husband and children, no dietary restrictions, exercises each week   Other Topics Concern  . Not on file   Social History Narrative    Outpatient Prescriptions Prior to Visit  Medication Sig Dispense Refill  . fluticasone (FLONASE) 50 MCG/ACT nasal spray 2 sprays in each nostril once daily 16 g 2  . Multiple Vitamin (MULTIVITAMIN) tablet Take 1 tablet by mouth daily.     No facility-administered medications prior to visit.      No Known Allergies  Review of Systems  Constitutional: Positive for malaise/fatigue. Negative for fever and chills.  HENT: Negative for congestion and hearing loss.   Eyes: Negative for discharge.  Respiratory: Negative for cough, sputum production and shortness of breath.   Cardiovascular: Negative for chest pain, palpitations and leg swelling.  Gastrointestinal: Negative for heartburn, nausea, vomiting, abdominal pain, diarrhea, constipation and blood in stool.  Genitourinary: Negative for dysuria, urgency, frequency and hematuria.  Musculoskeletal: Positive for back pain. Negative for myalgias and falls.  Skin: Negative for rash.  Neurological: Negative for dizziness, sensory change, loss of consciousness, weakness and headaches.  Endo/Heme/Allergies: Negative for environmental allergies. Does not bruise/bleed easily.  Psychiatric/Behavioral: Negative for depression and suicidal ideas. The patient is not nervous/anxious and does not have insomnia.        Objective:    Physical Exam  Constitutional: She is oriented to person, place, and time. She appears well-developed and well-nourished. No distress.  HENT:  Head: Normocephalic and atraumatic.  Eyes: Conjunctivae are normal.  Neck: Neck supple. No thyromegaly present.  Cardiovascular: Normal rate, regular rhythm and normal heart sounds.   No murmur heard. Pulmonary/Chest: Effort normal and breath sounds normal. No respiratory distress.  Abdominal: Soft. Bowel sounds are normal. She exhibits no distension and no mass. There is no tenderness.  Musculoskeletal:  She exhibits no edema.  Lymphadenopathy:    She has no cervical adenopathy.  Neurological: She is alert and oriented to person, place, and time.  Skin: Skin is warm and dry.  Psychiatric: She has a normal mood and affect. Her behavior is normal.    BP 104/72 mmHg  Pulse 74  Temp(Src) 97.7 F (36.5 C) (Oral)  Ht 5\' 9"  (1.753 m)  Wt 143 lb 8 oz (65.091 kg)  BMI  21.18 kg/m2  SpO2 97% Wt Readings from Last 3 Encounters:  05/17/15 143 lb 8 oz (65.091 kg)  09/14/14 141 lb (63.957 kg)  02/15/14 139 lb 1.9 oz (63.104 kg)     Lab Results  Component Value Date   WBC 7.3 05/17/2015   HGB 13.2 05/17/2015   HCT 40.3 05/17/2015   PLT 137.0* 05/17/2015   GLUCOSE 90 05/17/2015   CHOL 156 05/17/2015   TRIG 35.0 05/17/2015   HDL 73.10 05/17/2015   LDLCALC 76 05/17/2015   ALT 10 05/17/2015   AST 15 05/17/2015   NA 138 05/17/2015   K 3.8 05/17/2015   CL 103 05/17/2015   CREATININE 0.86 05/17/2015   BUN 11 05/17/2015   CO2 26 05/17/2015   TSH 0.97 05/17/2015    Lab Results  Component Value Date   TSH 0.97 05/17/2015   Lab Results  Component Value Date   WBC 7.3 05/17/2015   HGB 13.2 05/17/2015   HCT 40.3 05/17/2015   MCV 90.3 05/17/2015   PLT 137.0* 05/17/2015   Lab Results  Component Value Date   NA 138 05/17/2015   K 3.8 05/17/2015   CO2 26 05/17/2015   GLUCOSE 90 05/17/2015   BUN 11 05/17/2015   CREATININE 0.86 05/17/2015   BILITOT 0.6 05/17/2015   ALKPHOS 50 05/17/2015   AST 15 05/17/2015   ALT 10 05/17/2015   PROT 7.6 05/17/2015   ALBUMIN 4.3 05/17/2015   CALCIUM 9.8 05/17/2015   GFR 76.25 05/17/2015   Lab Results  Component Value Date   CHOL 156 05/17/2015   Lab Results  Component Value Date   HDL 73.10 05/17/2015   Lab Results  Component Value Date   LDLCALC 76 05/17/2015   Lab Results  Component Value Date   TRIG 35.0 05/17/2015   Lab Results  Component Value Date   CHOLHDL 2 05/17/2015   No results found for: HGBA1C     Assessment & Plan:   Problem List Items Addressed This Visit    Low back pain - Primary    Encouraged moist heat and gentle stretching as tolerated. May try NSAIDs and prescription meds as directed and report if symptoms worsen or seek immediate care. Xray today reveals mild arthritic changes      Relevant Orders   DG Lumbar Spine Complete (Completed)   TSH (Completed)   CBC  (Completed)   Comprehensive metabolic panel (Completed)   Lipid panel (Completed)   Preventative health care    Patient encouraged to maintain heart healthy diet, regular exercise, adequate sleep. Consider daily probiotics. Take medications as prescribed. Labs reviewed. Given and reviewed copy of ACP documents from [employer] [place] and encouraged to complete and return. [facility]       Relevant Orders   TSH (Completed)   CBC (Completed)   Comprehensive metabolic panel (Completed)   Lipid panel (Completed)    Other Visit Diagnoses    Thrombocytopenia (HCC)        Relevant Orders  TSH (Completed)    CBC (Completed)    Comprehensive metabolic panel (Completed)    Lipid panel (Completed)       I am having Ms. Glace maintain her fluticasone and multivitamin.  No orders of the defined types were placed in this encounter.     Danise Edge, MD

## 2015-05-28 NOTE — Assessment & Plan Note (Signed)
Patient encouraged to maintain heart healthy diet, regular exercise, adequate sleep. Consider daily probiotics. Take medications as prescribed. Labs reviewed. Given and reviewed copy of ACP documents from [employer] of [place] and encouraged to complete and return. [facility]

## 2015-07-14 ENCOUNTER — Encounter: Payer: Self-pay | Admitting: Medical

## 2015-07-14 ENCOUNTER — Ambulatory Visit (INDEPENDENT_AMBULATORY_CARE_PROVIDER_SITE_OTHER): Payer: BC Managed Care – PPO | Admitting: Medical

## 2015-07-14 VITALS — BP 110/74 | HR 88 | Temp 98.3°F | Ht 69.0 in | Wt 146.0 lb

## 2015-07-14 DIAGNOSIS — R350 Frequency of micturition: Secondary | ICD-10-CM | POA: Diagnosis not present

## 2015-07-14 DIAGNOSIS — Z87440 Personal history of urinary (tract) infections: Secondary | ICD-10-CM

## 2015-07-14 LAB — POC URINALSYSI DIPSTICK (AUTOMATED)
BILIRUBIN UA: NEGATIVE
GLUCOSE UA: NEGATIVE
KETONES UA: NEGATIVE
Leukocytes, UA: NEGATIVE
Nitrite, UA: NEGATIVE
Protein, UA: NEGATIVE
RBC UA: NEGATIVE
SPEC GRAV UA: 1.02
Urobilinogen, UA: 0.2
pH, UA: 5.5

## 2015-07-14 NOTE — Progress Notes (Signed)
Pre visit review using our clinic review tool, if applicable. No additional management support is needed unless otherwise documented below in the visit note. 

## 2015-07-14 NOTE — Progress Notes (Signed)
   Subjective:    Patient ID: Gail Sanchez, female    DOB: 1970-12-20, 45 y.o.   MRN: [id number]  HPI   Pt in for follow up. Pt states first weekend of December she had uti and then week before christmas. Usually get just one or two infection per year. Pt did otc test strip. That showed some wbc.  [facility]. 2nd time she went to [facility].  Pt states [facility] + for strep type bacteria. Pt was given macrobid at first. Then rx  switched to amoxicillin. Pt never took amoxicillin.   Pt went to [facility] later in month. When they did culture did not  not show much. Pt took the amoxicllin(which she got early in month) before she went to [facility]..  Early dec has frequent urination.  Late dec some pressure and frequency.  Over past month sometimes is holding her urine and not using the bathroom when needed due to be very busy at work.   When younger did not have any chronic uti.   LMP- June 21, 2015.  Presenlty no current symptoms.     Review of Systems  Constitutional: Negative for fever, chills, diaphoresis, activity change and fatigue.  Respiratory: Negative for cough, chest tightness and shortness of breath.   Cardiovascular: Negative for chest pain, palpitations and leg swelling.  Gastrointestinal: Negative for nausea, vomiting and abdominal pain.  Genitourinary: Negative for dysuria, urgency, frequency, flank pain, difficulty urinating, genital sores and pelvic pain.       See hpi for recent symptoms over past month.  Musculoskeletal: Negative for neck pain and neck stiffness.  Neurological: Negative for dizziness, seizures, numbness and headaches.  Psychiatric/Behavioral: Negative for behavioral problems, confusion and agitation. The patient is not nervous/anxious.        Objective:   Physical Exam  General  Mental Status- Alert. Orientation- Orientation x 4.   Skin General:- Normal. Moisture- Dry. Temperature-  Warm.  HEENT Head- normal.  Neck Neck- Supple.  Heart Ausculation-RRR  Lungs Ausculation- Clear, even, unlabored bilaterlly.    Abdomen Palpation/Percussion: Palpation and Percussion of the abdomen reveal- No suprapubic  Tenderness, No Rebound tenderness, No Rigidity(guarding), No Palpable abdominal masses and No jar tenderness. No suprapubic tenderness. Liver:-Normal. Spleen:- Normal. Other Characteristics- No Costovertebral angle tenderness- Left or Costovertebral angle tenderness- Right.  Auscultation: Auscultation of the abdomen reveals- Bowel Sounds normal.  Back- no cva pain.       Assessment & Plan:  Urine dip clear today but in light of recent symptoms will get culture For recent symptoms hydrate well, can drink cranberry juice, and empty bladder when you need to rather than holding urine.  Will do culture today to see if any bacteria present.  If you do get symptoms recommend early evaluation for culture so we can establish type of bacteria if present and start tx early.  Follow up in as needed or as regularly scheduled with pcp.

## 2015-07-14 NOTE — Patient Instructions (Addendum)
Urine dip clear today but in light of recent symptoms will get culture. For recent symptoms hydrate well, can drink cranberry juice, and empty bladder when you need to rather than holding urine.  Will do culture today to see if any bacteria present.  If you do get symptoms recommend early evaluation for culture so we can establish type of bacteria if present and start tx early.  Follow up in as needed or as regularly scheduled with pcp.

## 2015-07-15 ENCOUNTER — Ambulatory Visit: Payer: BC Managed Care – PPO | Admitting: Medical

## 2015-07-15 LAB — URINE CULTURE
Colony Count: NO GROWTH
Organism ID, Bacteria: NO GROWTH

## 2015-07-26 ENCOUNTER — Encounter: Payer: Self-pay | Admitting: Family Medicine

## 2015-07-28 ENCOUNTER — Other Ambulatory Visit: Payer: Self-pay | Admitting: Family Medicine

## 2015-07-28 DIAGNOSIS — R350 Frequency of micturition: Secondary | ICD-10-CM

## 2015-07-28 NOTE — Telephone Encounter (Signed)
Put order in for urine test.

## 2015-08-02 ENCOUNTER — Other Ambulatory Visit (INDEPENDENT_AMBULATORY_CARE_PROVIDER_SITE_OTHER): Payer: BC Managed Care – PPO

## 2015-08-02 DIAGNOSIS — R350 Frequency of micturition: Secondary | ICD-10-CM | POA: Diagnosis not present

## 2015-08-02 LAB — URINALYSIS
Bilirubin Urine: NEGATIVE
HGB URINE DIPSTICK: NEGATIVE
KETONES UR: NEGATIVE
Leukocytes, UA: NEGATIVE
Nitrite: NEGATIVE
Total Protein, Urine: NEGATIVE
URINE GLUCOSE: NEGATIVE
Urobilinogen, UA: 0.2 (ref 0.0–1.0)
pH: 6.5 (ref 5.0–8.0)

## 2015-08-04 LAB — CULTURE, URINE COMPREHENSIVE
Colony Count: NO GROWTH
Organism ID, Bacteria: NO GROWTH

## 2015-10-26 ENCOUNTER — Encounter: Payer: Self-pay | Admitting: Family Medicine

## 2015-10-26 MED ORDER — FLUTICASONE PROPIONATE 50 MCG/ACT NA SUSP: 2.0000 | Freq: Every day | NASAL | Status: DC

## 2015-10-26 NOTE — Telephone Encounter (Signed)
Rx sent 

## 2015-11-10 ENCOUNTER — Encounter: Payer: Self-pay | Admitting: *Deleted

## 2015-11-11 ENCOUNTER — Ambulatory Visit: Payer: BC Managed Care – PPO | Admitting: Family Medicine

## 2015-11-15 ENCOUNTER — Ambulatory Visit: Payer: BC Managed Care – PPO | Admitting: *Deleted

## 2015-11-15 DIAGNOSIS — I781 Nevus, non-neoplastic: Secondary | ICD-10-CM

## 2015-11-15 NOTE — Progress Notes (Signed)
Assessed the patient's spider veins. She will need one syringe.

## 2016-05-17 ENCOUNTER — Encounter: Payer: Self-pay | Admitting: Family Medicine

## 2016-05-17 ENCOUNTER — Other Ambulatory Visit: Payer: Self-pay | Admitting: Family Medicine

## 2016-05-17 ENCOUNTER — Ambulatory Visit (INDEPENDENT_AMBULATORY_CARE_PROVIDER_SITE_OTHER): Payer: BC Managed Care – PPO | Admitting: Family Medicine

## 2016-05-17 ENCOUNTER — Ambulatory Visit (HOSPITAL_BASED_OUTPATIENT_CLINIC_OR_DEPARTMENT_OTHER)
Admission: RE | Admit: 2016-05-17 | Discharge: 2016-05-17 | Disposition: A | Discharge status: Home / Self Care | Payer: BC Managed Care – PPO | Source: Ambulatory Visit | Attending: Family Medicine | Admitting: Family Medicine

## 2016-05-17 DIAGNOSIS — Z1231 Encounter for screening mammogram for malignant neoplasm of breast: Secondary | ICD-10-CM

## 2016-05-17 DIAGNOSIS — Z23 Encounter for immunization: Secondary | ICD-10-CM | POA: Diagnosis not present

## 2016-05-17 DIAGNOSIS — Z0000 Encounter for general adult medical examination without abnormal findings: Secondary | ICD-10-CM | POA: Diagnosis not present

## 2016-05-17 DIAGNOSIS — T7840XD Allergy, unspecified, subsequent encounter: Secondary | ICD-10-CM | POA: Diagnosis not present

## 2016-05-17 DIAGNOSIS — B351 Tinea unguium: Secondary | ICD-10-CM | POA: Diagnosis not present

## 2016-05-17 LAB — COMPREHENSIVE METABOLIC PANEL
ALT: 14 U/L (ref 0–35)
AST: 18 U/L (ref 0–37)
Albumin: 4.3 g/dL (ref 3.5–5.2)
Alkaline Phosphatase: 48 U/L (ref 39–117)
BILIRUBIN TOTAL: 0.8 mg/dL (ref 0.2–1.2)
BUN: 10 mg/dL (ref 6–23)
CO2: 31 meq/L (ref 19–32)
Calcium: 10 mg/dL (ref 8.4–10.5)
Chloride: 102 mEq/L (ref 96–112)
Creatinine, Ser: 0.93 mg/dL (ref 0.40–1.20)
GFR: 69.35 mL/min (ref >60.00)
Glucose, Bld: 81 mg/dL (ref 70–99)
Potassium: 4.1 mEq/L (ref 3.5–5.1)
Sodium: 139 mEq/L (ref 135–145)
Total Protein: 7.6 g/dL (ref 6.0–8.3)

## 2016-05-17 LAB — CBC
HCT: 39 % (ref 36.0–46.0)
Hemoglobin: 13.2 g/dL (ref 12.0–15.0)
MCHC: 34 g/dL (ref 30.0–36.0)
MCV: 89.6 fl (ref 78.0–100.0)
Platelets: 150 10*3/uL (ref 150.0–400.0)
RBC: 4.35 Mil/uL (ref 3.87–5.11)
RDW: 12.5 % (ref 11.5–15.5)
WBC: 5.9 10*3/uL (ref 4.0–10.5)

## 2016-05-17 NOTE — Assessment & Plan Note (Signed)
Patient encouraged to maintain heart healthy diet, regular exercise, adequate sleep. Consider daily probiotics. Take medications as prescribed. Check cmp and cbc today. [facility]

## 2016-05-17 NOTE — Patient Instructions (Addendum)
Zyrtec/Zantac (75-150 mg) twice daily if rash returns Preventive Care for Adults, Female A healthy lifestyle and preventive care can promote health and wellness. Preventive health guidelines for women include the following key practices.  A routine yearly physical is a good way to check with your health care provider about your health and preventive screening. It is a chance to share any concerns and updates on your health and to receive a thorough exam.  Visit your dentist for a routine exam and preventive care every 6 months. Brush your teeth twice a day and floss once a day. Good oral hygiene prevents tooth decay and gum disease.  The frequency of eye exams is based on your age, health, family medical history, use of contact lenses, and other factors. Follow your health care provider's recommendations for frequency of eye exams.  Eat a healthy diet. Foods like vegetables, fruits, whole grains, low-fat dairy products, and lean protein foods contain the nutrients you need without too many calories. Decrease your intake of foods high in solid fats, added sugars, and salt. Eat the right amount of calories for you.Get information about a proper diet from your health care provider, if necessary.  Regular physical exercise is one of the most important things you can do for your health. Most adults should get at least 150 minutes of moderate-intensity exercise (any activity that increases your heart rate and causes you to sweat) each week. In addition, most adults need muscle-strengthening exercises on 2 or more days a week.  Maintain a healthy weight. The body mass index (BMI) is a screening tool to identify possible weight problems. It provides an estimate of body fat based on height and weight. Your health care provider can find your BMI and can help you achieve or maintain a healthy weight.For adults 20 years and older:  A BMI below 18.5 is considered underweight.  A BMI of 18.5 to 24.9 is  normal.  A BMI of 25 to 29.9 is considered overweight.  A BMI of 30 and above is considered obese.  Maintain normal blood lipids and cholesterol levels by exercising and minimizing your intake of saturated fat. Eat a balanced diet with plenty of fruit and vegetables. Blood tests for lipids and cholesterol should begin at age 57 and be repeated every 5 years. If your lipid or cholesterol levels are high, you are over 50, or you are at high risk for heart disease, you may need your cholesterol levels checked more frequently.Ongoing high lipid and cholesterol levels should be treated with medicines if diet and exercise are not working.  If you smoke, find out from your health care provider how to quit. If you do not use tobacco, do not start.  Lung cancer screening is recommended for adults aged 37-80 years who are at high risk for developing lung cancer because of a history of smoking. A yearly low-dose CT scan of the lungs is recommended for people who have at least a 30-pack-year history of smoking and are a current smoker or have quit within the past 15 years. A pack year of smoking is smoking an average of 1 pack of cigarettes a day for 1 year (for example: 1 pack a day for 30 years or 2 packs a day for 15 years). Yearly screening should continue until the smoker has stopped smoking for at least 15 years. Yearly screening should be stopped for people who develop a health problem that would prevent them from having lung cancer treatment.  If you are  pregnant, do not drink alcohol. If you are breastfeeding, be very cautious about drinking alcohol. If you are not pregnant and choose to drink alcohol, do not have more than 1 drink per day. One drink is considered to be 12 ounces (355 mL) of beer, 5 ounces (148 mL) of wine, or 1.5 ounces (44 mL) of liquor.  Avoid use of street drugs. Do not share needles with anyone. Ask for help if you need support or instructions about stopping the use of  drugs.  High blood pressure causes heart disease and increases the risk of stroke. Your blood pressure should be checked at least every 1 to 2 years. Ongoing high blood pressure should be treated with medicines if weight loss and exercise do not work.  If you are 88-84 years old, ask your health care provider if you should take aspirin to prevent strokes.  Diabetes screening is done by taking a blood sample to check your blood glucose level after you have not eaten for a certain period of time (fasting). If you are not overweight and you do not have risk factors for diabetes, you should be screened once every 3 years starting at age 34. If you are overweight or obese and you are 62-54 years of age, you should be screened for diabetes every year as part of your cardiovascular risk assessment.  Breast cancer screening is essential preventive care for women. You should practice "breast self-awareness." This means understanding the normal appearance and feel of your breasts and may include breast self-examination. Any changes detected, no matter how small, should be reported to a health care provider. Women in their 44s and 30s should have a clinical breast exam (CBE) by a health care provider as part of a regular health exam every 1 to 3 years. After age 63, women should have a CBE every year. Starting at age 73, women should consider having a mammogram (breast X-ray test) every year. Women who have a family history of breast cancer should talk to their health care provider about genetic screening. Women at a high risk of breast cancer should talk to their health care providers about having an MRI and a mammogram every year.  Breast cancer gene (BRCA)-related cancer risk assessment is recommended for women who have family members with BRCA-related cancers. BRCA-related cancers include breast, ovarian, tubal, and peritoneal cancers. Having family members with these cancers may be associated with an increased  risk for harmful changes (mutations) in the breast cancer genes BRCA1 and BRCA2. Results of the assessment will determine the need for genetic counseling and BRCA1 and BRCA2 testing.  Your health care provider may recommend that you be screened regularly for cancer of the pelvic organs (ovaries, uterus, and vagina). This screening involves a pelvic examination, including checking for microscopic changes to the surface of your cervix (Pap test). You may be encouraged to have this screening done every 3 years, beginning at age 74.  For women ages 70-65, health care providers may recommend pelvic exams and Pap testing every 3 years, or they may recommend the Pap and pelvic exam, combined with testing for human papilloma virus (HPV), every 5 years. Some types of HPV increase your risk of cervical cancer. Testing for HPV may also be done on women of any age with unclear Pap test results.  Other health care providers may not recommend any screening for nonpregnant women who are considered low risk for pelvic cancer and who do not have symptoms. Ask your health care provider  if a screening pelvic exam is right for you.  If you have had past treatment for cervical cancer or a condition that could lead to cancer, you need Pap tests and screening for cancer for at least 20 years after your treatment. If Pap tests have been discontinued, your risk factors (such as having a new sexual partner) need to be reassessed to determine if screening should resume. Some women have medical problems that increase the chance of getting cervical cancer. In these cases, your health care provider may recommend more frequent screening and Pap tests.  Colorectal cancer can be detected and often prevented. Most routine colorectal cancer screening begins at the age of 37 years and continues through age 14 years. However, your health care provider may recommend screening at an earlier age if you have risk factors for colon cancer. On a  yearly basis, your health care provider may provide home test kits to check for hidden blood in the stool. Use of a small camera at the end of a tube, to directly examine the colon (sigmoidoscopy or colonoscopy), can detect the earliest forms of colorectal cancer. Talk to your health care provider about this at age 23, when routine screening begins. Direct exam of the colon should be repeated every 5-10 years through age 66 years, unless early forms of precancerous polyps or small growths are found.  People who are at an increased risk for hepatitis B should be screened for this virus. You are considered at high risk for hepatitis B if:  You were born in a country where hepatitis B occurs often. Talk with your health care provider about which countries are considered high risk.  Your parents were born in a high-risk country and you have not received a shot to protect against hepatitis B (hepatitis B vaccine).  You have HIV or AIDS.  You use needles to inject street drugs.  You live with, or have sex with, someone who has hepatitis B.  You get hemodialysis treatment.  You take certain medicines for conditions like cancer, organ transplantation, and autoimmune conditions.  Hepatitis C blood testing is recommended for all people born from [birth year] through 1965 and any individual with known risks for hepatitis C.  Practice safe sex. Use condoms and avoid high-risk sexual practices to reduce the spread of sexually transmitted infections (STIs). STIs include gonorrhea, chlamydia, syphilis, trichomonas, herpes, HPV, and human immunodeficiency virus (HIV). Herpes, HIV, and HPV are viral illnesses that have no cure. They can result in disability, cancer, and death.  You should be screened for sexually transmitted illnesses (STIs) including gonorrhea and chlamydia if:  You are sexually active and are younger than 24 years.  You are older than 24 years and your health care provider tells you that you are  at risk for this type of infection.  Your sexual activity has changed since you were last screened and you are at an increased risk for chlamydia or gonorrhea. Ask your health care provider if you are at risk.  If you are at risk of being infected with HIV, it is recommended that you take a prescription medicine daily to prevent HIV infection. This is called preexposure prophylaxis (PrEP). You are considered at risk if:  You are sexually active and do not regularly use condoms or know the HIV status of your partner(s).  You take drugs by injection.  You are sexually active with a partner who has HIV.  Talk with your health care provider about whether you are at  high risk of being infected with HIV. If you choose to begin PrEP, you should first be tested for HIV. You should then be tested every 3 months for as long as you are taking PrEP.  Osteoporosis is a disease in which the bones lose minerals and strength with aging. This can result in serious bone fractures or breaks. The risk of osteoporosis can be identified using a bone density scan. Women ages 56 years and over and women at risk for fractures or osteoporosis should discuss screening with their health care providers. Ask your health care provider whether you should take a calcium supplement or vitamin D to reduce the rate of osteoporosis.  Menopause can be associated with physical symptoms and risks. Hormone replacement therapy is available to decrease symptoms and risks. You should talk to your health care provider about whether hormone replacement therapy is right for you.  Use sunscreen. Apply sunscreen liberally and repeatedly throughout the day. You should seek shade when your shadow is shorter than you. Protect yourself by wearing long sleeves, pants, a wide-brimmed hat, and sunglasses year round, whenever you are outdoors.  Once a month, do a whole body skin exam, using a mirror to look at the skin on your back. Tell your health  care provider of new moles, moles that have irregular borders, moles that are larger than a pencil eraser, or moles that have changed in shape or color.  Stay current with required vaccines (immunizations).  Influenza vaccine. All adults should be immunized every year.  Tetanus, diphtheria, and acellular pertussis (Td, Tdap) vaccine. Pregnant women should receive 1 dose of Tdap vaccine during each pregnancy. The dose should be obtained regardless of the length of time since the last dose. Immunization is preferred during the 27th-36th week of gestation. An adult who has not previously received Tdap or who does not know her vaccine status should receive 1 dose of Tdap. This initial dose should be followed by tetanus and diphtheria toxoids (Td) booster doses every 10 years. Adults with an unknown or incomplete history of completing a 3-dose immunization series with Td-containing vaccines should begin or complete a primary immunization series including a Tdap dose. Adults should receive a Td booster every 10 years.  Varicella vaccine. An adult without evidence of immunity to varicella should receive 2 doses or a second dose if she has previously received 1 dose. Pregnant females who do not have evidence of immunity should receive the first dose after pregnancy. This first dose should be obtained before leaving the health care facility. The second dose should be obtained 4-8 weeks after the first dose.  Human papillomavirus (HPV) vaccine. Females aged 13-26 years who have not received the vaccine previously should obtain the 3-dose series. The vaccine is not recommended for use in pregnant females. However, pregnancy testing is not needed before receiving a dose. If a female is found to be pregnant after receiving a dose, no treatment is needed. In that case, the remaining doses should be delayed until after the pregnancy. Immunization is recommended for any person with an immunocompromised condition through  the age of 42 years if she did not get any or all doses earlier. During the 3-dose series, the second dose should be obtained 4-8 weeks after the first dose. The third dose should be obtained 24 weeks after the first dose and 16 weeks after the second dose.  Zoster vaccine. One dose is recommended for adults aged 105 years or older unless certain conditions are present.  Measles, mumps, and rubella (MMR) vaccine. Adults born before [birth year] generally are considered immune to measles and mumps. Adults born in [birth year] or later should have 1 or more doses of MMR vaccine unless there is a contraindication to the vaccine or there is laboratory evidence of immunity to each of the three diseases. A routine second dose of MMR vaccine should be obtained at least 28 days after the first dose for students attending postsecondary schools, health care workers, or international travelers. People who received inactivated measles vaccine or an unknown type of measles vaccine during 1963-1967 should receive 2 doses of MMR vaccine. People who received inactivated mumps vaccine or an unknown type of mumps vaccine before 1979 and are at high risk for mumps infection should consider immunization with 2 doses of MMR vaccine. For females of childbearing age, rubella immunity should be determined. If there is no evidence of immunity, females who are not pregnant should be vaccinated. If there is no evidence of immunity, females who are pregnant should delay immunization until after pregnancy. Unvaccinated health care workers born before [birth year] who lack laboratory evidence of measles, mumps, or rubella immunity or laboratory confirmation of disease should consider measles and mumps immunization with 2 doses of MMR vaccine or rubella immunization with 1 dose of MMR vaccine.  Pneumococcal 13-valent conjugate (PCV13) vaccine. When indicated, a person who is uncertain of his immunization history and has no record of immunization should receive the  PCV13 vaccine. All adults 53 years of age and older should receive this vaccine. An adult aged 37 years or older who has certain medical conditions and has not been previously immunized should receive 1 dose of PCV13 vaccine. This PCV13 should be followed with a dose of pneumococcal polysaccharide (PPSV23) vaccine. Adults who are at high risk for pneumococcal disease should obtain the PPSV23 vaccine at least 8 weeks after the dose of PCV13 vaccine. Adults older than 44 years of age who have normal immune system function should obtain the PPSV23 vaccine dose at least 1 year after the dose of PCV13 vaccine.  Pneumococcal polysaccharide (PPSV23) vaccine. When PCV13 is also indicated, PCV13 should be obtained first. All adults aged 54 years and older should be immunized. An adult younger than age 65 years who has certain medical conditions should be immunized. Any person who resides in a nursing home or long-term care facility should be immunized. An adult smoker should be immunized. People with an immunocompromised condition and certain other conditions should receive both PCV13 and PPSV23 vaccines. People with human immunodeficiency virus (HIV) infection should be immunized as soon as possible after diagnosis. Immunization during chemotherapy or radiation therapy should be avoided. Routine use of PPSV23 vaccine is not recommended for American Indians, [place] Natives, or people younger than 65 years unless there are medical conditions that require PPSV23 vaccine. When indicated, people who have unknown immunization and have no record of immunization should receive PPSV23 vaccine. One-time revaccination 5 years after the first dose of PPSV23 is recommended for people aged 19-64 years who have chronic kidney failure, nephrotic syndrome, asplenia, or immunocompromised conditions. People who received 1-2 doses of PPSV23 before age 40 years should receive another dose of PPSV23 vaccine at age 64 years or later if at least  5 years have passed since the previous dose. Doses of PPSV23 are not needed for people immunized with PPSV23 at or after age 102 years.  Meningococcal vaccine. Adults with asplenia or persistent complement component deficiencies should receive 2 doses of quadrivalent meningococcal  conjugate (MenACWY-D) vaccine. The doses should be obtained at least 2 months apart. Microbiologists working with certain meningococcal bacteria, [employer] recruits, people at risk during an outbreak, and people who travel to or live in countries with a high rate of meningitis should be immunized. A first-year college student up through age 65 years who is living in a residence hall should receive a dose if she did not receive a dose on or after her 16th birthday. Adults who have certain high-risk conditions should receive one or more doses of vaccine.  Hepatitis A vaccine. Adults who wish to be protected from this disease, have certain high-risk conditions, work with hepatitis A-infected animals, work in hepatitis A research labs, or travel to or work in countries with a high rate of hepatitis A should be immunized. Adults who were previously unvaccinated and who anticipate close contact with an international adoptee during the first 60 days after arrival in the [place] from a country with a high rate of hepatitis A should be immunized.  Hepatitis B vaccine. Adults who wish to be protected from this disease, have certain high-risk conditions, may be exposed to blood or other infectious body fluids, are household contacts or sex partners of hepatitis B positive people, are clients or workers in certain care facilities, or travel to or work in countries with a high rate of hepatitis B should be immunized.  Haemophilus influenzae type b (Hib) vaccine. A previously unvaccinated person with asplenia or sickle cell disease or having a scheduled splenectomy should receive 1 dose of Hib vaccine. Regardless of previous immunization, a  recipient of a hematopoietic stem cell transplant should receive a 3-dose series 6-12 months after her successful transplant. Hib vaccine is not recommended for adults with HIV infection. Preventive Services / Frequency Ages 85 to 67 years  Blood pressure check.** / Every 3-5 years.  Lipid and cholesterol check.** / Every 5 years beginning at age 77.  Clinical breast exam.** / Every 3 years for women in their 38s and 44s.  BRCA-related cancer risk assessment.** / For women who have family members with a BRCA-related cancer (breast, ovarian, tubal, or peritoneal cancers).  Pap test.** / Every 2 years from ages 60 through 5. Every 3 years starting at age 37 through age 53 or 71 with a history of 3 consecutive normal Pap tests.  HPV screening.** / Every 3 years from ages 66 through ages 77 to 38 with a history of 3 consecutive normal Pap tests.  Hepatitis C blood test.** / For any individual with known risks for hepatitis C.  Skin self-exam. / Monthly.  Influenza vaccine. / Every year.  Tetanus, diphtheria, and acellular pertussis (Tdap, Td) vaccine.** / Consult your health care provider. Pregnant women should receive 1 dose of Tdap vaccine during each pregnancy. 1 dose of Td every 10 years.  Varicella vaccine.** / Consult your health care provider. Pregnant females who do not have evidence of immunity should receive the first dose after pregnancy.  HPV vaccine. / 3 doses over 6 months, if 75 and younger. The vaccine is not recommended for use in pregnant females. However, pregnancy testing is not needed before receiving a dose.  Measles, mumps, rubella (MMR) vaccine.** / You need at least 1 dose of MMR if you were born in 1957 or later. You may also need a 2nd dose. For females of childbearing age, rubella immunity should be determined. If there is no evidence of immunity, females who are not pregnant should be vaccinated. If there  is no evidence of immunity, females who are pregnant  should delay immunization until after pregnancy.  Pneumococcal 13-valent conjugate (PCV13) vaccine.** / Consult your health care provider.  Pneumococcal polysaccharide (PPSV23) vaccine.** / 1 to 2 doses if you smoke cigarettes or if you have certain conditions.  Meningococcal vaccine.** / 1 dose if you are age 42 to 34 years and a Market researcher living in a residence hall, or have one of several medical conditions, you need to get vaccinated against meningococcal disease. You may also need additional booster doses.  Hepatitis A vaccine.** / Consult your health care provider.  Hepatitis B vaccine.** / Consult your health care provider.  Haemophilus influenzae type b (Hib) vaccine.** / Consult your health care provider. Ages 59 to 89 years  Blood pressure check.** / Every year.  Lipid and cholesterol check.** / Every 5 years beginning at age 45 years.  Lung cancer screening. / Every year if you are aged 4-80 years and have a 30-pack-year history of smoking and currently smoke or have quit within the past 15 years. Yearly screening is stopped once you have quit smoking for at least 15 years or develop a health problem that would prevent you from having lung cancer treatment.  Clinical breast exam.** / Every year after age 25 years.  BRCA-related cancer risk assessment.** / For women who have family members with a BRCA-related cancer (breast, ovarian, tubal, or peritoneal cancers).  Mammogram.** / Every year beginning at age 58 years and continuing for as long as you are in good health. Consult with your health care provider.  Pap test.** / Every 3 years starting at age 51 years through age 33 or 56 years with a history of 3 consecutive normal Pap tests.  HPV screening.** / Every 3 years from ages 36 years through ages 34 to 70 years with a history of 3 consecutive normal Pap tests.  Fecal occult blood test (FOBT) of stool. / Every year beginning at age 64 years and  continuing until age 57 years. You may not need to do this test if you get a colonoscopy every 10 years.  Flexible sigmoidoscopy or colonoscopy.** / Every 5 years for a flexible sigmoidoscopy or every 10 years for a colonoscopy beginning at age 21 years and continuing until age 31 years.  Hepatitis C blood test.** / For all people born from [birth year] through 1965 and any individual with known risks for hepatitis C.  Skin self-exam. / Monthly.  Influenza vaccine. / Every year.  Tetanus, diphtheria, and acellular pertussis (Tdap/Td) vaccine.** / Consult your health care provider. Pregnant women should receive 1 dose of Tdap vaccine during each pregnancy. 1 dose of Td every 10 years.  Varicella vaccine.** / Consult your health care provider. Pregnant females who do not have evidence of immunity should receive the first dose after pregnancy.  Zoster vaccine.** / 1 dose for adults aged 40 years or older.  Measles, mumps, rubella (MMR) vaccine.** / You need at least 1 dose of MMR if you were born in 1957 or later. You may also need a second dose. For females of childbearing age, rubella immunity should be determined. If there is no evidence of immunity, females who are not pregnant should be vaccinated. If there is no evidence of immunity, females who are pregnant should delay immunization until after pregnancy.  Pneumococcal 13-valent conjugate (PCV13) vaccine.** / Consult your health care provider.  Pneumococcal polysaccharide (PPSV23) vaccine.** / 1 to 2 doses if you smoke cigarettes or if  you have certain conditions.  Meningococcal vaccine.** / Consult your health care provider.  Hepatitis A vaccine.** / Consult your health care provider.  Hepatitis B vaccine.** / Consult your health care provider.  Haemophilus influenzae type b (Hib) vaccine.** / Consult your health care provider. Ages 27 years and over  Blood pressure check.** / Every year.  Lipid and cholesterol check.** / Every 5 years  beginning at age 53 years.  Lung cancer screening. / Every year if you are aged 7-80 years and have a 30-pack-year history of smoking and currently smoke or have quit within the past 15 years. Yearly screening is stopped once you have quit smoking for at least 15 years or develop a health problem that would prevent you from having lung cancer treatment.  Clinical breast exam.** / Every year after age 97 years.  BRCA-related cancer risk assessment.** / For women who have family members with a BRCA-related cancer (breast, ovarian, tubal, or peritoneal cancers).  Mammogram.** / Every year beginning at age 47 years and continuing for as long as you are in good health. Consult with your health care provider.  Pap test.** / Every 3 years starting at age 81 years through age 34 or 84 years with 3 consecutive normal Pap tests. Testing can be stopped between 65 and 70 years with 3 consecutive normal Pap tests and no abnormal Pap or HPV tests in the past 10 years.  HPV screening.** / Every 3 years from ages 51 years through ages 50 or 58 years with a history of 3 consecutive normal Pap tests. Testing can be stopped between 65 and 70 years with 3 consecutive normal Pap tests and no abnormal Pap or HPV tests in the past 10 years.  Fecal occult blood test (FOBT) of stool. / Every year beginning at age 53 years and continuing until age 1 years. You may not need to do this test if you get a colonoscopy every 10 years.  Flexible sigmoidoscopy or colonoscopy.** / Every 5 years for a flexible sigmoidoscopy or every 10 years for a colonoscopy beginning at age 57 years and continuing until age 34 years.  Hepatitis C blood test.** / For all people born from [birth year] through 1964 and any individual with known risks for hepatitis C.  Osteoporosis screening.** / A one-time screening for women ages 2 years and over and women at risk for fractures or osteoporosis.  Skin self-exam. / Monthly.  Influenza vaccine. /  Every year.  Tetanus, diphtheria, and acellular pertussis (Tdap/Td) vaccine.** / 1 dose of Td every 10 years.  Varicella vaccine.** / Consult your health care provider.  Zoster vaccine.** / 1 dose for adults aged 1 years or older.  Pneumococcal 13-valent conjugate (PCV13) vaccine.** / Consult your health care provider.  Pneumococcal polysaccharide (PPSV23) vaccine.** / 1 dose for all adults aged 26 years and older.  Meningococcal vaccine.** / Consult your health care provider.  Hepatitis A vaccine.** / Consult your health care provider.  Hepatitis B vaccine.** / Consult your health care provider.  Haemophilus influenzae type b (Hib) vaccine.** / Consult your health care provider. ** Family history and personal history of risk and conditions may change your health care provider's recommendations.   This information is not intended to replace advice given to you by your health care provider. Make sure you discuss any questions you have with your health care provider.   Document Released: 08/21/2001 Document Revised: 07/16/2014 Document Reviewed: 11/20/2010 Elsevier Interactive Patient Education [employer].

## 2016-05-17 NOTE — Assessment & Plan Note (Signed)
Has had recurrent dermatitis on face after eating citrus. Zyrtec and Zantac twice daily

## 2016-05-17 NOTE — Progress Notes (Signed)
Pre visit review using our clinic review tool, if applicable. No additional management support is needed unless otherwise documented below in the visit note. 

## 2016-05-27 ENCOUNTER — Encounter: Payer: Self-pay | Admitting: Family Medicine

## 2016-05-27 DIAGNOSIS — B351 Tinea unguium: Secondary | ICD-10-CM

## 2016-05-27 HISTORY — DX: Tinea unguium: B35.1

## 2016-05-27 NOTE — Progress Notes (Signed)
Patient ID: Marylynne C Wiltse, female   DOB: [birth year], 45 y.o.   MRN: [id number]   Subjective:    Patient ID: Sunshyne C Dasaro, female    DOB: 25-Aug-1970, 45 y.o.   MRN: [id number]  Chief Complaint  Patient presents with  . Annual Exam    HPI Patient is in today for  Annual preventative exam. She feels well. No recent illness or acute concerns. She has some thickening of toenails on left foot. No recent hospitalizations. Attempts to maintain a heart healthy diet and exercise regularly. She notes fatigue but is appropriate for her activity level. Denies CP/palp/SOB/HA/congestion/fevers/GI or GU c/o. Taking meds as prescribed  Past Medical History:  Diagnosis Date  . Allergic state 06/09/2012  . Chicken pox as a child  . Fatigue 09/28/2011  . Low back pain 05/28/2015  . Onychomycosis 05/27/2016  . Preventative health care 06/09/2012  . 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 Vaginitis 12/27/2011    Past Surgical History:  Procedure Laterality Date  . cervix frozen      Family History  Problem Relation Age of Onset  . Cancer Mother   . Hypertension Father   . Asthma Brother     Social History   Social History  . Marital status: Married    Spouse name: N/A  . Number of children: N/A  . Years of education: N/A   Occupational History  . Not on file.   Social History Main Topics  . Smoking status: Never Smoker  . Smokeless tobacco: Never Used  . Alcohol use Yes     Comment: occ  . Drug use: No  . Sexual activity: Yes     Comment: lives with husband and children, no dietary restrictions, exercises each week   Other Topics Concern  . Not on file   Social History Narrative  . No narrative on file    Outpatient Medications Prior to Visit  Medication Sig Dispense Refill  . fluticasone (FLONASE) 50 MCG/ACT nasal spray Place 2 sprays into both nostrils daily. 16 g 5  . Multiple Vitamin (MULTIVITAMIN) tablet Take 1  tablet by mouth daily.    . methocarbamol (ROBAXIN) 500 MG tablet Take 1 tablet (500 mg total) by mouth 2 (two) times daily as needed for muscle spasms. 40 tablet 1   No facility-administered medications prior to visit.     No Known Allergies  Review of Systems  Constitutional: Negative for chills, fever and malaise/fatigue.  HENT: Negative for congestion and hearing loss.   Eyes: Negative for blurred vision and discharge.  Respiratory: Negative for cough, sputum production and shortness of breath.   Cardiovascular: Negative for chest pain, palpitations and leg swelling.  Gastrointestinal: Negative for abdominal pain, blood in stool, constipation, diarrhea, heartburn, nausea and vomiting.  Genitourinary: Negative for dysuria, frequency, hematuria and urgency.  Musculoskeletal: Negative for back pain, falls and myalgias.  Skin: Negative for rash.  Neurological: Negative for dizziness, sensory change, loss of consciousness, weakness and headaches.  Endo/Heme/Allergies: Negative for environmental allergies. Does not bruise/bleed easily.  Psychiatric/Behavioral: Negative for depression and suicidal ideas. The patient is not nervous/anxious and does not have insomnia.        Objective:    Physical Exam  Constitutional: She is oriented to person, place, and time. She appears well-developed and well-nourished. No distress.  HENT:  Head: Normocephalic and atraumatic.  Eyes: Conjunctivae are normal.  Neck: Neck supple. No thyromegaly present.  Cardiovascular: Normal rate, regular rhythm and normal heart sounds.   No murmur heard. Pulmonary/Chest: Effort normal and breath sounds normal. No respiratory distress.  Abdominal: Soft. Bowel sounds are normal. She exhibits no distension and no mass. There is no tenderness.  Musculoskeletal: She exhibits no edema.  Lymphadenopathy:    She has no cervical adenopathy.  Neurological: She is alert and oriented to person, place, and time.  Skin:  Skin is warm and dry.  Psychiatric: She has a normal mood and affect. Her behavior is normal.    BP 102/78 (BP Location: Left Arm, Patient Position: Sitting, Cuff Size: Normal)   Pulse 78   Temp 98.2 F (36.8 C) (Oral)   Ht 5\' 9"  (1.753 m)   Wt 140 lb 6 oz (63.7 kg)   SpO2 98%   BMI 20.73 kg/m  Wt Readings from Last 3 Encounters:  05/17/16 140 lb 6 oz (63.7 kg)  07/14/15 146 lb (66.2 kg)  05/17/15 143 lb 8 oz (65.1 kg)     Lab Results  Component Value Date   WBC 5.9 05/17/2016   HGB 13.2 05/17/2016   HCT 39.0 05/17/2016   PLT 150.0 05/17/2016   GLUCOSE 81 05/17/2016   CHOL 156 05/17/2015   TRIG 35.0 05/17/2015   HDL 73.10 05/17/2015   LDLCALC 76 05/17/2015   ALT 14 05/17/2016   AST 18 05/17/2016   NA 139 05/17/2016   K 4.1 05/17/2016   CL 102 05/17/2016   CREATININE 0.93 05/17/2016   BUN 10 05/17/2016   CO2 31 05/17/2016   TSH 0.97 05/17/2015    Lab Results  Component Value Date   TSH 0.97 05/17/2015   Lab Results  Component Value Date   WBC 5.9 05/17/2016   HGB 13.2 05/17/2016   HCT 39.0 05/17/2016   MCV 89.6 05/17/2016   PLT 150.0 05/17/2016   Lab Results  Component Value Date   NA 139 05/17/2016   K 4.1 05/17/2016   CO2 31 05/17/2016   GLUCOSE 81 05/17/2016   BUN 10 05/17/2016   CREATININE 0.93 05/17/2016   BILITOT 0.8 05/17/2016   ALKPHOS 48 05/17/2016   AST 18 05/17/2016   ALT 14 05/17/2016   PROT 7.6 05/17/2016   ALBUMIN 4.3 05/17/2016   CALCIUM 10.0 05/17/2016   GFR 69.35 05/17/2016   Lab Results  Component Value Date   CHOL 156 05/17/2015   Lab Results  Component Value Date   HDL 73.10 05/17/2015   Lab Results  Component Value Date   LDLCALC 76 05/17/2015   Lab Results  Component Value Date   TRIG 35.0 05/17/2015   Lab Results  Component Value Date   CHOLHDL 2 05/17/2015   No results found for: HGBA1C     Assessment & Plan:   Problem List Items Addressed This Visit    Preventative health care    Patient  encouraged to maintain heart healthy diet, regular exercise, adequate sleep. Consider daily probiotics. Take medications as prescribed. Check cmp and cbc today. [facility]      Relevant Orders   CBC (Completed)   Comprehensive metabolic panel (Completed)   Allergic state    Has had recurrent dermatitis on face after eating citrus. Zyrtec and Zantac twice daily      Onychomycosis    Encouraged to soak feet in distilled white vinegar and hot water nightly then to apply Lamisil cream and Vick's Vapor Rub alternately.  Other Visit Diagnoses    Encounter for immunization       Relevant Orders   Flu Vaccine QUAD 36+ mos IM (Completed)      I am having Ms. Bakos maintain her multivitamin, fluticasone, and methocarbamol.  Meds ordered this encounter  Medications  . methocarbamol (ROBAXIN) 500 MG tablet    Sig: Take by mouth.     Danise EdgeBLYTH, STACEY, MD

## 2016-05-27 NOTE — Assessment & Plan Note (Signed)
Encouraged to soak feet in distilled white vinegar and hot water nightly then to apply Lamisil cream and Vick's Vapor Rub alternately.

## 2016-06-19 ENCOUNTER — Encounter: Payer: Self-pay | Admitting: Allergy & Immunology

## 2016-06-19 ENCOUNTER — Ambulatory Visit (INDEPENDENT_AMBULATORY_CARE_PROVIDER_SITE_OTHER): Payer: BC Managed Care – PPO | Admitting: Allergy & Immunology

## 2016-06-19 VITALS — BP 122/70 | HR 67 | Temp 98.1°F | Resp 18 | Ht 67.32 in | Wt 141.8 lb

## 2016-06-19 DIAGNOSIS — J310 Chronic rhinitis: Secondary | ICD-10-CM | POA: Diagnosis not present

## 2016-06-19 DIAGNOSIS — J300 Vasomotor rhinitis: Secondary | ICD-10-CM

## 2016-06-19 DIAGNOSIS — L508 Other urticaria: Secondary | ICD-10-CM

## 2016-06-19 DIAGNOSIS — T781XXD Other adverse food reactions, not elsewhere classified, subsequent encounter: Secondary | ICD-10-CM | POA: Diagnosis not present

## 2016-06-19 NOTE — Progress Notes (Signed)
NEW PATIENT  Date of Service/Encounter:  06/19/16   Assessment:   Chronic vasomotor rhinitis  Acute urticaria  Adverse food reaction, subsequent encounter    Plan/Recommendations:   1. Chronic vasomotor rhinitis - Testing today showed: negative to all allergens (both skin prick testing and intradermal). - Intradermal in particular as excellent negative predictive value.  - Continue with Flonase but increase to two sprays per nostril once daily. - Use every day for the best effect. - Start Xyzal 37m daily as needed for breakthrough allergy symptoms  2. Acute urticaria - I am unsure what caused your rash during the summer, but I do not anticipate that it will happen again. - Since it only lasted one week, we can defer a more thorough blood workup at this point. - Complete Blood Count and Complete Metabolic Panel have been normal as recently as one month ago, which is reassuring. - History also lacks "red flag" signs, including fevers, joint pain, and residual skin changes.   3. Adverse food reaction - Testing was negative to orange, pineapple, and strawberry. - Testing has good negative predictive value, therefore we tend to believe the negative tests. - There is no indication for an EpiPen at this time. - You can introduce these foods at home without risk. - If you notice that other foods are triggering the rash, take notes and we can retest at the next appointment.  4. Return in about 3 months (around 09/17/2016).     Subjective:   Gail Sanchez is a 45y.o. female presenting today for evaluation of  Chief Complaint  Patient presents with  . Allergic Reaction    this pass spring broke out in a rash not sure where it came from (mabe fruit)- steriod shot cleared it up  . Allergy Testing    environmental and foods  .  Gail Sanchez has a history of the following: Patient Active Problem List   Diagnosis Date Noted  . Onychomycosis 05/27/2016  . Low  back pain 05/28/2015  . Preventative health care 06/09/2012  . Allergic state 06/09/2012  . 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 Fatigue 09/28/2011  . 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 UTI'S, HX OF 09/25/2010  . CHICKENPOX, HX OF 09/25/2010    History obtained from: chart review and patient.  Julianna C Wheat was referred by BPenni Homans MD.     Gail Sanchez is a 45y.o. female presenting for evaluation of allergies. The history was obtained from the EMR as well as the patient.   Gail Sanchez had an allergic reaction in the spring 2017. She was her son's baseball game and she noticed that as the weekend went on she developed more and more bumps over her neck and shoulders. She did have a new shampoo and she was eating clementine oranges throughout the weekend. She has avoided these orange since that time. Symptoms consisted only of the rash (cholinergic urticaria like - when it was present, it did worsen with sweating). She did not have wheezing, abdominal pain, or others. She took Benadryl without resolution. She went to PCP where she received an IM steroid injection. This injection cleared it up quickly. In total, the rash lasted around one week because Gail Sanchez finally went to see her PCP after one week of the rash.   Shortly thereafter she had bumps when she was eating limes. She does not do lemonade and does not eat grapefruit. She did have a similar reaction with  strawberries. She did eat watermelon over the summer without a problem. She otherwise tolerates all of the major 8 food allergens. She tolerates all vegetables without a problem. She has never needed epinephrine and has never needed evaluation in the ED.   Gail Sanchez does endorse chronic nasal congestion. She uses Flonase only as needed. She has never taken regularly. She does not have bad reactions to the [facility] but has never been told that she should take it on a daily basis. She did try Allegra years ago and  she tolerated this the best without drowsiness. She has not tried Xyzal at all. She has never been on Singulair. She has not paid attention to how her symptoms are when in any particular environments. She is never around animals and does not know whether this worsens her symptoms at all.   Gail Sanchez does have a history of a nasal retention cyst. She was seen by a [facility]. Imaging performed which was normal. When she was pregnant with her youngest child, the cyst burst and released dark colored mucous. It filled back up after that but now has resolved. She never needed surgery. She has had around two sinus infections in total, otherwise no history of infections.   Otherwise, there is no history of other atopic diseases, including asthma, drug allergies, stinging insect allergies, or urticaria. There is no significant infectious history. Vaccinations are up to date.    Past Medical History: Patient Active Problem List   Diagnosis Date Noted  . Onychomycosis 05/27/2016  . Low back pain 05/28/2015  . Preventative health care 06/09/2012  . Allergic state 06/09/2012  . 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 Fatigue 09/28/2011  . 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 UTI'S, HX OF 09/25/2010  . CHICKENPOX, HX OF 09/25/2010    Medication List:    Medication List       Accurate as of 06/19/16  4:44 PM. Always use your most recent med list.          desloratadine 5 MG tablet Commonly known as:  CLARINEX Take 5 mg by mouth as needed.   fluticasone 50 MCG/ACT nasal spray Commonly known as:  FLONASE Place 2 sprays into both nostrils daily.   methocarbamol 500 MG tablet Commonly known as:  ROBAXIN Take by mouth.   multivitamin tablet Take 1 tablet by mouth daily.       Birth History: non-contributory. Born at term without complications.   Developmental History: Gail Sanchez has met all milestones on time. She has required no speech therapy, occupational therapy, or  physical therapy.   Past Surgical History: Past Surgical History:  Procedure Laterality Date  . cervix frozen       Family History: Family History  Problem Relation Age of Onset  . Cancer Mother   . Eczema Mother   . Hypertension Father   . Asthma Brother   . Allergic rhinitis Neg Hx   . Angioedema Neg Hx   . Immunodeficiency Neg Hx   . Urticaria Neg Hx      Social History: Altagracia lives at home with her three children (ages 77, 97, and 30) and her husband. She currently works as a Oncologist.  She lives in a home that is 44 years old. There is no mildew or rubs problems in the home. They have hardwood floors the main living area is carpeting in the bedrooms. They have electric heating and central cooling. There are no dust mite covers on the  bedding. There is no tobacco smoke exposure.   Review of Systems: a 14-point review of systems is pertinent for what is mentioned in HPI.  Otherwise, all other systems were negative. Constitutional: negative other than that listed in the HPI Eyes: negative other than that listed in the HPI Ears, nose, mouth, throat, and face: negative other than that listed in the HPI Respiratory: negative other than that listed in the HPI Cardiovascular: negative other than that listed in the HPI Gastrointestinal: negative other than that listed in the HPI Genitourinary: negative other than that listed in the HPI Integument: negative other than that listed in the HPI Hematologic: negative other than that listed in the HPI Musculoskeletal: negative other than that listed in the HPI Neurological: negative other than that listed in the HPI Allergy/Immunologic: negative other than that listed in the HPI    Objective:   Blood pressure 122/70, pulse 67, temperature 98.1 F (36.7 C), temperature source Oral, resp. rate 18, height 5' 7.32" (1.71 m), weight 141 lb 12.8 oz (64.3 kg), SpO2 97 %. Body mass index is 22 kg/m.   Physical  Exam:  General: Alert, interactive, in no acute distress. Cooperative with the exam. Interactive,although it did take a while for her to warm up.  Eyes: No conjunctival injection present on the right, No conjunctival injection present on the left, PERRL bilaterally, No discharge on the right and No discharge on the left Ears: Right TM pearly gray with normal light reflex, Left TM pearly gray with normal light reflex, Right TM intact without perforation and Left TM intact without perforation.  Nose/Throat: External nose within normal limits and septum midline, turbinates edematous and pale without discharge, post-pharynx erythematous without cobblestoning in the posterior oropharynx. Tonsils unremarklable without exudates Neck: Supple without thyromegaly. Adenopathy: no enlarged lymph nodes appreciated in the anterior cervical, occipital, axillary, epitrochlear, inguinal, or popliteal regions Lungs: Clear to auscultation bilaterally with isolated expiratory wheezing heard in the left lower base. No increased work of breathing. CV: Normal S1/S2, no murmurs. Capillary refill <2 seconds.  Abdomen: Nondistended, nontender. No guarding or rebound tenderness. Bowel sounds faint and present in all fields  Skin: Warm and dry, without lesions or rashes. Extremities:  No clubbing, cyanosis or edema. Neuro:   Grossly intact. No focal deficits appreciated. Responsive to questions.  Diagnostic studies:   Allergy Studies:    Indoor/Outdoor Percutaneous Adult Environmental Panel: negative to the entire panel with adequate controls.  ndoor/Outdoor Selected Intradermal Environmental Panel: negative to entire panel with adequate controls.  Most Common Foods Panel (peanut, tree nut, soy, fish mix, shellfish mix, wheat, milk, egg): equivocal to cashew, otherwise negative with adequate controls  Selected Foods Panel: negative to orange, strawberry, and pineapple with adequate controls    Salvatore Marvel, MD  [facility]

## 2016-06-19 NOTE — Patient Instructions (Addendum)
1. Chronic rhinitis - Testing today showed: negative testing to the entire panel (both prick testing and intradermal testing) - Stop Flonase and use the sample of Dymista: 2 sprays per nostril 1-2 times daily - Dymista combines a nasal steroid with a nasal antihistamine. - Use every day for the best effect. - Start Xyzal 5mg  daily as needed for breakthrough allergy symptoms. - Call us if these are working and we can send in prescriptions.   2. Acute urticaria - I am unsure what caused your rash during the summer, but I do not anticipate that it will happen again. - Since it only lasted one week, we can defer a more thorough blood workup at this point. - Complete Blood Count and Complete Metabolic Panel have been normal as recently as one month ago, which is reassuring. - History also lacks "red flag" signs, including fevers, joint pain, and residual skin changes.   3. Adverse food reaction - Testing was negative to orange, pineapple, and strawberry. - Testing has good negative predictive value, therefore we tend to believe the negative tests. - There is no indication for an EpiPen at this time. - You can introduce these foods at home without risk. - If you notice that other foods are triggering the rash, take notes and we can retest at the next appointment.  4. Return in about 3 months (around 09/17/2016).  Please inform us of any Emergency Department visits, hospitalizations, or changes in symptoms. Call us before going to the ED for breathing or allergy symptoms since we might be able to fit you in for a sick visit. Feel free to contact us anytime with any questions, problems, or concerns.  It was a pleasure to meet you today! Have a wonderful holiday season!   Websites that have reliable patient information: 1. American Academy of Asthma, Allergy, and Immunology: www.aaaai.org 2. Food Allergy Research and Education (FARE): foodallergy.org 3. Mothers of Asthmatics:  http://www.asthmacommunitynetwork.org 4. American College of Allergy, Asthma, and Immunology: www.acaai.org

## 2016-09-24 ENCOUNTER — Encounter: Payer: Self-pay | Admitting: Medical

## 2016-09-24 ENCOUNTER — Ambulatory Visit (INDEPENDENT_AMBULATORY_CARE_PROVIDER_SITE_OTHER): Payer: BC Managed Care – PPO | Admitting: Medical

## 2016-09-24 VITALS — BP 135/60 | HR 89 | Temp 98.3°F | Ht 69.0 in | Wt 142.8 lb

## 2016-09-24 DIAGNOSIS — R002 Palpitations: Secondary | ICD-10-CM | POA: Diagnosis not present

## 2016-09-24 DIAGNOSIS — Z8639 Personal history of other endocrine, nutritional and metabolic disease: Secondary | ICD-10-CM

## 2016-09-24 NOTE — Patient Instructions (Addendum)
Your ekg appears to show probable preatrial contraction. Avoid all caffeine beverage for .Also avoid any decongestant products over the counter.  I am going to refer you to cardiologist for likely holter monitor. You may need to be on medication depending on holter findings.  Victorino DikeJennifer is referral coordinator. If no call by wed this week then please call her.  If you feel palpitation as prior then check to see if we can see in office. But if severe then ED evaluation.  Follow up as regularly scheduled with pcp or as neededed here. My goal is to get you in with specialist by one week or sooner    Premature Atrial Contraction A premature atrial contraction [facility](PAC) is a kind of irregular heartbeat (arrhythmia). It happens when the heart beats too early and then pauses before beating again. PACs are also called skipped heartbeats because they may make you feel like your heart is stopping for a second, even though the heart does not actually skip a beat. The heart has four areas, or chambers. Normally, electrical signals spread across the heart and make all the chambers beat together. During a PAC, the upper chambers of the heart (right atrium and left atrium) beat too early, before they have had time to fill with blood. The heartbeat pauses afterward so the heart can fill with blood for the next beat. What are the causes? The cause of this condition is often unknown. Sometimes it is caused by heart disease or injury to the heart. What increases the risk? This condition is more likely to develop in adults who are 45 years of age or older and in children. Episodes may be triggered by:  Caffeine.  Stress.  Tiredness.  Alcohol.  Smoking.  Stimulant drugs.  Heart disease. What are the signs or symptoms? Symptoms of this condition include:  A feeling that your heart skipped a beat. The first heartbeat after the "skipped" beat may feel more forceful.  A feeling that your heart is  fluttering. How is this diagnosed? This condition is diagnosed based on:  Your symptoms.  A physical exam. Your health care provider may listen to your heart.  Tests to rule out other conditions, such as a test that records the electrical impulses of the heart and assesses heart health (electrocardiogram, or ECG). If you have an ECG, you may need to wear a portable ECG machine (Holter monitor) that records your heart beats for 24 hours or more. How is this treated?   Usually, treatment is not needed for this condition. If you have episodes that happen often or if a cause is found, you may receive treatment for the underlying cause of your PACs. Follow these instructions at home: Lifestyle  Follow these instructions as told by your health care provider:  Do not use any products that contain nicotine or tobacco, such as cigarettes and e-cigarettes. If you need help quitting, ask your health care provider.  If caffeine triggers episodes, do not eat, drink, or use anything with caffeine in it.  If caffeine does not seem to trigger episodes, consume caffeine in moderation.  If alcohol triggers episodes of PAC, do not drink alcohol.  If alcohol does not seem to trigger episodes, limit alcohol intake to no more than 1 drink a day for nonpregnant women and 2 drinks a day for men. One drink equals 12 oz of beer, 5 oz of wine, or 1 oz of hard liquor.  Exercise regularly. Ask your health care provider what type of  exercise is safe for you.  Find healthy ways to manage stress. Avoid stressful situations when possible.  Try to get at least 7-9 hours of sleep each night, or as much as recommended by your health care provider.  Do not use illegal drugs. General instructions   Take over-the-counter and prescription medicines only as told by your health care provider.  Keep all follow-up visits as told by your health care provider. This is important. Contact a health care provider if:  You  feel your heart skipping beats more than once a day.  Your heart skips beats and you feel dizzy, light-headed, or very tired. Get help right away if:  You have chest pain.  You have trouble breathing. This information is not intended to replace advice given to you by your health care provider. Make sure you discuss any questions you have with your health care provider. Document Released: 02/26/2014 Document Revised: 02/21/2016 Document Reviewed: 12/23/2015 Elsevier Interactive Patient Education  2017 [employer].

## 2016-09-24 NOTE — Progress Notes (Signed)
Subjective:    Patient ID: Gail Sanchez, female    DOB: May 18, 1971, 46 y.o.   MRN: [id number]  HPI  Pt in for evaluation.  She states about one month ago got the flu.   She states about 1 week after the flu would feel sweaty when working out. She would feel otherwise ok. The reported sweating sensation resolve and no longer recurrent.  She  then states 2 weeks ago about 3 consecutive days of sensation of her heart skipping a beat. Pt states she feels anxious about these events and she not sure if she has had any further. These events occurred at home and and work on those days. Would feel very transient for 3-4 seconds. No leg pain or popliteal pain reported. Pt drinks occasional coffee max one cup a day. No caffeinated soda. Pt at time of palpitations was on mucinex with dextromethoraphn  Denies excess work stress at time of palpitations.  Hx of low k years ago.  LMP- Feb 27,2018  Review of Systems  Constitutional: Negative for chills, fatigue and fever.  Respiratory: Negative for cough, shortness of breath and wheezing.   Cardiovascular: Positive for palpitations. Negative for chest pain.       See hpi.  Gastrointestinal: Negative for abdominal pain, diarrhea and nausea.  Musculoskeletal: Negative for back pain, myalgias and neck pain.  Skin: Negative for rash.  Neurological: Negative for dizziness, syncope, speech difficulty, weakness, numbness and headaches.  Hematological: Negative for adenopathy. Does not bruise/bleed easily.  Psychiatric/Behavioral: Negative for behavioral problems, confusion, decreased concentration, hallucinations and sleep disturbance. The patient is not nervous/anxious.     Past Medical History:  Diagnosis Date  . Allergic state 06/09/2012  . Chicken pox as a child  . Fatigue 09/28/2011  . Low back pain 05/28/2015  . Onychomycosis 05/27/2016  . Preventative health care 06/09/2012  . 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 Vaginitis 12/27/2011     Social History   Social History  . Marital status: Married    Spouse name: N/A  . Number of children: N/A  . Years of education: N/A   Occupational History  . Not on file.   Social History Main Topics  . Smoking status: Never Smoker  . Smokeless tobacco: Never Used  . Alcohol use Yes     Comment: occ  . Drug use: No  . Sexual activity: Yes     Comment: lives with husband and children, no dietary restrictions, exercises each week   Other Topics Concern  . Not on file   Social History Narrative  . No narrative on file    Past Surgical History:  Procedure Laterality Date  . cervix frozen      Family History  Problem Relation Age of Onset  . Cancer Mother   . Eczema Mother   . Hypertension Father   . Asthma Brother   . Allergic rhinitis Neg Hx   . Angioedema Neg Hx   . Immunodeficiency Neg Hx   . Urticaria Neg Hx     No Known Allergies  Current Outpatient Prescriptions on File Prior to Visit  Medication Sig Dispense Refill  . desloratadine (CLARINEX) 5 MG tablet Take 5 mg by mouth as needed.    . fluticasone (FLONASE) 50 MCG/ACT nasal spray Place 2 sprays into both nostrils daily. 16 g 5  . methocarbamol (ROBAXIN) 500 MG tablet Take by mouth.    . Multiple Vitamin (MULTIVITAMIN) tablet Take  1 tablet by mouth daily.     No current facility-administered medications on file prior to visit.     BP 135/60 (BP Location: Left Arm, Cuff Size: Normal)   Pulse 89   Temp 98.3 F (36.8 C) (Oral)   Ht 5\' 9"  (1.753 m)   Wt 142 lb 12.8 oz (64.8 kg)   LMP 09/04/2016   SpO2 100%   BMI 21.09 kg/m       Objective:   Physical Exam   General Mental Status- Alert. General Appearance- Not in acute distress.   Skin General: Color- Normal Color. Moisture- Normal Moisture.  Neck Carotid Arteries- Normal color. Moisture- Normal Moisture. No carotid bruits. No JVD.  Chest and Lung Exam Auscultation: Breath  Sounds:-Normal.  Cardiovascular Auscultation:Rythm- Regular. Murmurs & Other Heart Sounds:Auscultation of the heart reveals- No Murmurs.  Abdomen Inspection:-Inspeection Normal. Palpation/Percussion:Note:No mass. Palpation and Percussion of the abdomen reveal- Non Tender, Non Distended + BS, no rebound or guarding.    Neurologic Cranial Nerve exam:- CN III-XII intact(No nystagmus), symmetric smile. Strength:- 5/5 equal and symmetric strength both upper and lower extremities..  .       Assessment & Plan:  Your ekg appears to show probable preatrial contraction. Avoid all caffeine beverage for .Also avoid any decongestant products over the counter.  I am going to refer you to cardiologist for likely holter monitor. You may need to be on medication depending on holter findings.  Victorino Dike is referral coordinator. If no call by wed this week then please call her.  If you feel palpitation as prior then check to see if we can see in office. But if severe then ED evaluation.  Follow up as regularly scheduled with pcp or as neededed here. My goal is to get you in with specialist by one week or sooner   Calloway Andrus, Ramon Dredge, [place]

## 2016-09-24 NOTE — Progress Notes (Signed)
Pre visit review using our clinic review tool, if applicable. No additional management support is needed unless otherwise documented below in the visit note. 

## 2016-10-01 NOTE — Progress Notes (Deleted)
Cardiology Office Note   Date:  10/01/2016   ID:  Gail Sanchez, DOB [birth year], MRN [id number]  PCP:  Danise EdgeStacey Blyth, MD  Cardiologist:   Rollene RotundaJames Eulice Rutledge, MD  Referring:  ***  No chief complaint on file.     History of Present Illness: Gail Sanchez is a 46 y.o. female who presents for evaluation of palpitations.  ***    Past Medical History:  Diagnosis Date  . Allergic state 06/09/2012  . Chicken pox as a child  . Fatigue 09/28/2011  . Low back pain 05/28/2015  . Onychomycosis 05/27/2016  . Preventative health care 06/09/2012  . 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 Vaginitis 12/27/2011    Past Surgical History:  Procedure Laterality Date  . cervix frozen       Current Outpatient Prescriptions  Medication Sig Dispense Refill  . desloratadine (CLARINEX) 5 MG tablet Take 5 mg by mouth as needed.    . fluticasone (FLONASE) 50 MCG/ACT nasal spray Place 2 sprays into both nostrils daily. 16 g 5  . methocarbamol (ROBAXIN) 500 MG tablet Take by mouth.    . Multiple Vitamin (MULTIVITAMIN) tablet Take 1 tablet by mouth daily.     No current facility-administered medications for this visit.     Allergies:   Patient has no known allergies.    Social History:  The patient  reports that she has never smoked. She has never used smokeless tobacco. She reports that she drinks alcohol. She reports that she does not use drugs.   Family History:  The patient's ***family history includes Asthma in her brother; Cancer in her mother; Eczema in her mother; Hypertension in her father.    ROS:  Please see the history of present illness.   Otherwise, review of systems are positive for {NONE DEFAULTED:18576::"none"}.   All other systems are reviewed and negative.    PHYSICAL EXAM: VS:  LMP 09/04/2016  , BMI There is no height or weight on file to calculate BMI. GENERAL:  Well appearing HEENT:  Pupils equal round and reactive, fundi not  visualized, oral mucosa unremarkable NECK:  No jugular venous distention, waveform within normal limits, carotid upstroke brisk and symmetric, no bruits, no thyromegaly LYMPHATICS:  No cervical, inguinal adenopathy LUNGS:  Clear to auscultation bilaterally BACK:  No CVA tenderness CHEST:  Unremarkable HEART:  PMI not displaced or sustained,S1 and S2 within normal limits, no S3, no S4, no clicks, no rubs, *** murmurs ABD:  Flat, positive bowel sounds normal in frequency in pitch, no bruits, no rebound, no guarding, no midline pulsatile mass, no hepatomegaly, no splenomegaly EXT:  2 plus pulses throughout, no edema, no cyanosis no clubbing SKIN:  No rashes no nodules NEURO:  Cranial nerves II through XII grossly intact, motor grossly intact throughout PSYCH:  Cognitively intact, oriented to person place and time    EKG:  EKG {ACTION; IS/IS [id number]}OT:[id number]} ordered today. The ekg ordered today demonstrates ***   Recent Labs: 05/17/2016: ALT 14; BUN 10; Creatinine, Ser 0.93; Hemoglobin 13.2; Platelets 150.0; Potassium 4.1; Sodium 139    Lipid Panel    Component Value Date/Time   CHOL 156 05/17/2015 1529   TRIG 35.0 05/17/2015 1529   HDL 73.10 05/17/2015 1529   CHOLHDL 2 05/17/2015 1529   VLDL 7.0 05/17/2015 1529   LDLCALC 76 05/17/2015 1529      Wt Readings from Last 3 Encounters:  09/24/16 142 lb 12.8 oz (64.8 kg)  06/19/16 141 lb 12.8 oz (64.3 kg)  05/17/16 140 lb 6 oz (63.7 kg)      Other studies Reviewed: Additional studies/ records that were reviewed today include: ***. Review of the above records demonstrates:  Please see elsewhere in the note.  ***   ASSESSMENT AND PLAN:  ***   Current medicines are reviewed at length with the patient today.  The patient {ACTIONS; HAS/DOES NOT HAVE:19233} concerns regarding medicines.  The following changes have been made:  {PLAN; NO CHANGE:13088:s}  Labs/ tests ordered today include: *** No orders of the defined types were  placed in this encounter.    Disposition:   FU with ***    Signed, Rollene Rotunda, MD  10/01/2016 9:13 AM    [facility]

## 2016-10-03 ENCOUNTER — Ambulatory Visit: Payer: BC Managed Care – PPO | Admitting: Cardiology

## 2016-10-03 NOTE — Progress Notes (Signed)
Cardiology Office Note   Date:  10/04/2016   ID:  Gail Sanchez, DOB 1971/04/20, MRN [id number]  PCP:  Danise Edge, MD  Cardiologist:   Rollene Rotunda, MD  Referring:  Danise Edge, MD  Chief Complaint  Patient presents with  . Palpitations      History of Present Illness: Gail Sanchez is a 46 y.o. female who presents for evaluation of palpitations.  She noticed these recently after having the flu.  She would feel a skipped beat.  This happened infrequently at rest.  She was able to exercise and not have any symptoms.  The patient denies any new symptoms such as chest discomfort, neck or arm discomfort. There has been no new shortness of breath, PND or orthopnea. There have been no reported palpitations, presyncope or syncope.  She did see her PCP and had PACs in a bigeminal pattern. However, she could not feel these with her EKG was taken then or today.    Past Medical History:  Diagnosis Date  . Allergic state 06/09/2012  . Chicken pox as a child  . Fatigue 09/28/2011  . Low back pain 05/28/2015  . Onychomycosis 05/27/2016  . Preventative health care 06/09/2012  . 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 Vaginitis 12/27/2011    Past Surgical History:  Procedure Laterality Date  . cervix frozen       Current Outpatient Prescriptions  Medication Sig Dispense Refill  . desloratadine (CLARINEX) 5 MG tablet Take 5 mg by mouth as needed.    . fluticasone (FLONASE) 50 MCG/ACT nasal spray Place 2 sprays into both nostrils daily. 16 g 5  . methocarbamol (ROBAXIN) 500 MG tablet Take by mouth.    . Multiple Vitamin (MULTIVITAMIN) tablet Take 1 tablet by mouth daily.     No current facility-administered medications for this visit.     Allergies:   Patient has no known allergies.     Social History:  The patient  reports that she has never smoked. She has never used smokeless tobacco. She reports that she drinks alcohol. She reports that  she does not use drugs.   Family History:  The patient's family history includes Asthma in her brother; Cancer in her mother; Eczema in her mother; Hypertension in her father.    ROS:  Please see the history of present illness.   Otherwise, review of systems are positive for none.   All other systems are reviewed and negative.    PHYSICAL EXAM: VS:  BP 124/80   Pulse 82   Ht 5\' 9"  (1.753 m)   Wt 143 lb 9.6 oz (65.1 kg)   LMP 09/04/2016   BMI 21.21 kg/m  , BMI Body mass index is 21.21 kg/m. GENERAL:  Well appearing HEENT:  Pupils equal round and reactive, fundi not visualized, oral mucosa unremarkable NECK:  No jugular venous distention, waveform within normal limits, carotid upstroke brisk and symmetric, no bruits, no thyromegaly LYMPHATICS:  No cervical, inguinal adenopathy LUNGS:  Clear to auscultation bilaterally BACK:  No CVA tenderness CHEST:  Unremarkable HEART:  PMI not displaced or sustained,S1 and S2 within normal limits, no S3, no S4, no clicks, no rubs, no murmurs ABD:  Flat, positive bowel sounds normal in frequency in pitch, no bruits, no rebound, no guarding, no midline pulsatile mass, no hepatomegaly, no splenomegaly EXT:  2 plus pulses throughout, no edema, no cyanosis no clubbing SKIN:  No rashes no nodules NEURO:  Cranial  nerves II through XII grossly intact, motor grossly intact throughout [facility] Allied DiseasesSYCH:  Cognitively intact, oriented to person place and time    EKG:  EKG  ordered today. The ekg ordered today demonstrates sinus rhythm with premature atrial contractions, axis within normal limits, intervals within normal limits, ST-T wave changes   Recent Labs: 05/17/2016: ALT 14; BUN 10; Creatinine, Ser 0.93; Hemoglobin 13.2; Platelets 150.0; Potassium 4.1; Sodium 139    Lipid Panel    Component Value Date/Time   CHOL 156 05/17/2015 1529   TRIG 35.0 05/17/2015 1529   HDL 73.10 05/17/2015 1529   CHOLHDL 2 05/17/2015 1529   VLDL 7.0 05/17/2015 1529   LDLCALC 76  05/17/2015 1529      Wt Readings from Last 3 Encounters:  10/04/16 143 lb 9.6 oz (65.1 kg)  09/24/16 142 lb 12.8 oz (64.8 kg)  06/19/16 141 lb 12.8 oz (64.3 kg)      Other studies Reviewed: Additional studies/ records that were reviewed today include: EKG. Review of the above records demonstrates:  Please see elsewhere in the note.     ASSESSMENT AND PLAN:   PALPITATIONS:  These are not symptomatic for the most part.  I suspect a structurally normal heart.  At this point she does not want therapy for this.  I will check labs as below.  Otherwise no change in therapy or further study.    She will call me if she develops and worsening symptoms.     Current medicines are reviewed at length with the patient today.  The patient does not have concerns regarding medicines.  The following changes have been made:  no change  Labs/ tests ordered today include:   Orders Placed This Encounter  Procedures  . Basic Metabolic Panel (BMET)  . Magnesium  . TSH  . EKG 12-Lead     Disposition:   FU with me as needed.      Signed, Rollene RotundaJames Libbi Towner, MD  10/04/2016 9:15 AM    [facility]

## 2016-10-04 ENCOUNTER — Encounter: Payer: Self-pay | Admitting: Cardiology

## 2016-10-04 ENCOUNTER — Ambulatory Visit (INDEPENDENT_AMBULATORY_CARE_PROVIDER_SITE_OTHER): Payer: BC Managed Care – PPO | Admitting: Cardiology

## 2016-10-04 VITALS — BP 124/80 | HR 82 | Ht 69.0 in | Wt 143.6 lb

## 2016-10-04 DIAGNOSIS — R002 Palpitations: Secondary | ICD-10-CM | POA: Diagnosis not present

## 2016-10-04 DIAGNOSIS — Z79899 Other long term (current) drug therapy: Secondary | ICD-10-CM | POA: Diagnosis not present

## 2016-10-04 LAB — BASIC METABOLIC PANEL
BUN: 10 mg/dL (ref 7–25)
CO2: 31 mmol/L (ref 20–31)
Calcium: 9.4 mg/dL (ref 8.6–10.2)
Chloride: 104 mmol/L (ref 98–110)
Creat: 0.9 mg/dL (ref 0.50–1.10)
Glucose, Bld: 70 mg/dL (ref 65–99)
POTASSIUM: 4.1 mmol/L (ref 3.5–5.3)
Sodium: 141 mmol/L (ref 135–146)

## 2016-10-04 LAB — MAGNESIUM: Magnesium: 2.1 mg/dL (ref 1.5–2.5)

## 2016-10-04 LAB — TSH: TSH: 1.71 mIU/L

## 2016-10-04 NOTE — Patient Instructions (Signed)
Medication Instructions:  Continue current medications  Labwork: BMP, TSH and Magnesium  Testing/Procedures: None Ordered  Follow-Up: Your physician recommends that you schedule a follow-up appointment in: As Needed   Any Other Special Instructions Will Be Listed Below (If Applicable).   If you need a refill on your cardiac medications before your next appointment, please call your pharmacy.

## 2016-10-08 ENCOUNTER — Telehealth: Payer: Self-pay | Admitting: Cardiology

## 2017-01-16 ENCOUNTER — Other Ambulatory Visit: Payer: Self-pay | Admitting: Family Medicine

## 2017-03-19 ENCOUNTER — Other Ambulatory Visit: Payer: Self-pay | Admitting: Family Medicine

## 2017-04-02 ENCOUNTER — Other Ambulatory Visit: Payer: Self-pay | Admitting: Family Medicine

## 2017-05-20 ENCOUNTER — Ambulatory Visit (HOSPITAL_BASED_OUTPATIENT_CLINIC_OR_DEPARTMENT_OTHER): Payer: BC Managed Care – PPO

## 2017-05-20 ENCOUNTER — Encounter: Payer: BC Managed Care – PPO | Admitting: Family Medicine

## 2017-05-29 ENCOUNTER — Other Ambulatory Visit: Payer: Self-pay

## 2017-05-29 MED ORDER — FLUTICASONE PROPIONATE 50 MCG/ACT NA SUSP: NASAL | 1 refills | Status: DC

## 2017-06-07 ENCOUNTER — Other Ambulatory Visit: Payer: Self-pay

## 2017-06-07 MED ORDER — FLUTICASONE PROPIONATE 50 MCG/ACT NA SUSP: NASAL | 1 refills | Status: DC

## 2017-06-25 ENCOUNTER — Ambulatory Visit (HOSPITAL_BASED_OUTPATIENT_CLINIC_OR_DEPARTMENT_OTHER)
Admission: RE | Admit: 2017-06-25 | Discharge: 2017-06-25 | Disposition: A | Discharge status: Home / Self Care | Payer: BC Managed Care – PPO | Source: Ambulatory Visit | Attending: Family Medicine | Admitting: Family Medicine

## 2017-06-25 ENCOUNTER — Ambulatory Visit (INDEPENDENT_AMBULATORY_CARE_PROVIDER_SITE_OTHER): Payer: BC Managed Care – PPO | Admitting: Family Medicine

## 2017-06-25 ENCOUNTER — Encounter: Payer: Self-pay | Admitting: Family Medicine

## 2017-06-25 VITALS — BP 98/76 | HR 67 | Temp 97.8°F | Resp 18 | Ht 69.0 in | Wt 140.4 lb

## 2017-06-25 DIAGNOSIS — Z0000 Encounter for general adult medical examination without abnormal findings: Secondary | ICD-10-CM

## 2017-06-25 DIAGNOSIS — F419 Anxiety disorder, unspecified: Secondary | ICD-10-CM | POA: Diagnosis not present

## 2017-06-25 DIAGNOSIS — Z1231 Encounter for screening mammogram for malignant neoplasm of breast: Secondary | ICD-10-CM | POA: Diagnosis not present

## 2017-06-25 HISTORY — DX: Anxiety disorder, unspecified: F41.9

## 2017-06-25 LAB — COMPREHENSIVE METABOLIC PANEL
ALT: 11 U/L (ref 0–35)
AST: 17 U/L (ref 0–37)
Albumin: 4.2 g/dL (ref 3.5–5.2)
Alkaline Phosphatase: 46 U/L (ref 39–117)
BUN: 10 mg/dL (ref 6–23)
CHLORIDE: 101 meq/L (ref 96–112)
CO2: 29 meq/L (ref 19–32)
Calcium: 9.2 mg/dL (ref 8.4–10.5)
Creatinine, Ser: 0.83 mg/dL (ref 0.40–1.20)
GFR: 78.69 mL/min (ref >60.00)
GLUCOSE: 80 mg/dL (ref 70–99)
Potassium: 3.8 mEq/L (ref 3.5–5.1)
Sodium: 138 mEq/L (ref 135–145)
Total Bilirubin: 0.7 mg/dL (ref 0.2–1.2)
Total Protein: 7.4 g/dL (ref 6.0–8.3)

## 2017-06-25 LAB — CBC
HCT: 39.8 % (ref 36.0–46.0)
Hemoglobin: 13.2 g/dL (ref 12.0–15.0)
MCHC: 33.2 g/dL (ref 30.0–36.0)
MCV: 91.5 fl (ref 78.0–100.0)
PLATELETS: 130 10*3/uL — AB (ref 150.0–400.0)
RBC: 4.35 Mil/uL (ref 3.87–5.11)
RDW: 12.6 % (ref 11.5–15.5)
WBC: 5.2 10*3/uL (ref 4.0–10.5)

## 2017-06-25 LAB — TSH: TSH: 1.04 u[IU]/mL (ref 0.35–4.50)

## 2017-06-25 LAB — LIPID PANEL
CHOL/HDL RATIO: 2
Cholesterol: 143 mg/dL (ref 0–200)
HDL: 72.6 mg/dL (ref >39.00)
LDL CALC: 62 mg/dL (ref 0–99)
NonHDL: 70.65
Triglycerides: 42 mg/dL (ref 0.0–149.0)
VLDL: 8.4 mg/dL (ref 0.0–40.0)

## 2017-06-25 MED ORDER — DESLORATADINE 5 MG PO TABS: 5.0000 mg | ORAL_TABLET | ORAL | 0 refills | Status: DC | PRN

## 2017-06-25 MED ORDER — METHOCARBAMOL 500 MG PO TABS: 500.0000 mg | ORAL_TABLET | Freq: Four times a day (QID) | ORAL | 0 refills | Status: DC | PRN

## 2017-06-25 MED ORDER — ALPRAZOLAM 0.25 MG PO TABS: 0.2500 mg | ORAL_TABLET | Freq: Two times a day (BID) | ORAL | 2 refills | Status: DC | PRN

## 2017-06-25 NOTE — Assessment & Plan Note (Signed)
Patient encouraged to maintain heart healthy diet, regular exercise, adequate sleep. Consider daily probiotics. Take medications as prescribed. Labs ordered  

## 2017-06-25 NOTE — Assessment & Plan Note (Addendum)
Situational in nature, will try Alprazolam 0.25 mg prn, is aware of the risk and would like to proceed if needs med frequently will consider daily med as well. She now cares for her aging parents and her young parents.

## 2017-06-25 NOTE — Patient Instructions (Signed)
Preventive Care 18-39 Years, Female Preventive care refers to lifestyle choices and visits with your health care provider that can promote health and wellness. What does preventive care include?  A yearly physical exam. This is also called an annual well check.  Dental exams once or twice a year.  Routine eye exams. Ask your health care provider how often you should have your eyes checked.  Personal lifestyle choices, including: ? Daily care of your teeth and gums. ? Regular physical activity. ? Eating a healthy diet. ? Avoiding tobacco and drug use. ? Limiting alcohol use. ? Practicing safe sex. ? Taking vitamin and mineral supplements as recommended by your health care provider. What happens during an annual well check? The services and screenings done by your health care provider during your annual well check will depend on your age, overall health, lifestyle risk factors, and family history of disease. Counseling Your health care provider may ask you questions about your:  Alcohol use.  Tobacco use.  Drug use.  Emotional well-being.  Home and relationship well-being.  Sexual activity.  Eating habits.  Work and work Statistician.  Method of birth control.  Menstrual cycle.  Pregnancy history.  Screening You may have the following tests or measurements:  Height, weight, and BMI.  Diabetes screening. This is done by checking your blood sugar (glucose) after you have not eaten for a while (fasting).  Blood pressure.  Lipid and cholesterol levels. These may be checked every 5 years starting at age 66.  Skin check.  Hepatitis C blood test.  Hepatitis B blood test.  Sexually transmitted disease (STD) testing.  BRCA-related cancer screening. This may be done if you have a family history of breast, ovarian, tubal, or peritoneal cancers.  Pelvic exam and Pap test. This may be done every 3 years starting at age 40. Starting at age 59, this may be done every 5  years if you have a Pap test in combination with an HPV test.  Discuss your test results, treatment options, and if necessary, the need for more tests with your health care provider. Vaccines Your health care provider may recommend certain vaccines, such as:  Influenza vaccine. This is recommended every year.  Tetanus, diphtheria, and acellular pertussis (Tdap, Td) vaccine. You may need a Td booster every 10 years.  Varicella vaccine. You may need this if you have not been vaccinated.  HPV vaccine. If you are 69 or younger, you may need three doses over 6 months.  Measles, mumps, and rubella (MMR) vaccine. You may need at least one dose of MMR. You may also need a second dose.  Pneumococcal 13-valent conjugate (PCV13) vaccine. You may need this if you have certain conditions and were not previously vaccinated.  Pneumococcal polysaccharide (PPSV23) vaccine. You may need one or two doses if you smoke cigarettes or if you have certain conditions.  Meningococcal vaccine. One dose is recommended if you are age 27-21 years and a first-year college student living in a residence hall, or if you have one of several medical conditions. You may also need additional booster doses.  Hepatitis A vaccine. You may need this if you have certain conditions or if you travel or work in places where you may be exposed to hepatitis A.  Hepatitis B vaccine. You may need this if you have certain conditions or if you travel or work in places where you may be exposed to hepatitis B.  Haemophilus influenzae type b (Hib) vaccine. You may need this if  you have certain risk factors.  Talk to your health care provider about which screenings and vaccines you need and how often you need them. This information is not intended to replace advice given to you by your health care provider. Make sure you discuss any questions you have with your health care provider. Document Released: 08/21/2001 Document Revised: 03/14/2016  Document Reviewed: 04/26/2015 Elsevier Interactive Patient Education  2017 [employer].

## 2017-06-25 NOTE — Progress Notes (Signed)
Subjective:  I acted as a Neurosurgeon for Dr. Abner Greenspan. Gail Sanchez, Gail Sanchez  Patient ID: Gail Sanchez, female    DOB: 1971/04/21, 46 y.o.   MRN: [id number]  No chief complaint on file.   HPI  Patient is in today for an annual exam and follow-up on chronic medical conditions including palpitations, anxiety.  Overall her physical health is good.  Her greatest concern is her ongoing stress levels.  She acknowledges anxiety with palpitations at times agitation and difficulty settling down.  She does care for her young children work and now is caring for her ailing parents.  As a result she is often very stressed and fatigued.  No recent febrile illness or hospitalization. Denies CP/SOB/HA/congestion/fevers/GI or GU c/o. Taking meds as prescribed Patient Care Team: Bradd Canary, MD as PCP - General (Family Medicine) Wandalee Ferdinand, MD as Referring Physician (Obstetrics and Gynecology)   Past Medical History:  Diagnosis Date  . Allergic state 06/09/2012  . Anxiety 06/25/2017  . Chicken pox as a child  . Fatigue 09/28/2011  . Low back pain 05/28/2015  . Onychomycosis 05/27/2016  . Preventative health care 06/09/2012  . 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 Vaginitis 12/27/2011    Past Surgical History:  Procedure Laterality Date  . cervix frozen      Family History  Problem Relation Age of Onset  . Cancer Mother   . Eczema Mother   . Hypertension Father   . Asthma Brother   . Allergic rhinitis Neg Hx   . Angioedema Neg Hx   . Immunodeficiency Neg Hx   . Urticaria Neg Hx     Social History   Socioeconomic History  . Marital status: Married    Spouse name: Not on file  . Number of children: Not on file  . Years of education: Not on file  . Highest education level: Not on file  Social Needs  . Financial resource strain: Not on file  . Food insecurity - worry: Not on file  . Food insecurity - inability: Not on file  . Transportation needs - medical:  Not on file  . Transportation needs - non-medical: Not on file  Occupational History  . Occupation: Works with school   Tobacco Use  . Smoking status: Never Smoker  . Smokeless tobacco: Never Used  Substance and Sexual Activity  . Alcohol use: Yes    Comment: occ  . Drug use: No  . Sexual activity: Yes    Comment: lives with husband and children, no dietary restrictions, exercises each week  Other Topics Concern  . Not on file  Social History Narrative  . Not on file    Outpatient Medications Prior to Visit  Medication Sig Dispense Refill  . fluticasone (FLONASE) 50 MCG/ACT nasal spray USE 2 SPRAYS INTO BOTH NOSTRILS DAILY 16 g 1  . Multiple Vitamin (MULTIVITAMIN) tablet Take 1 tablet by mouth daily.    Marland Kitchen desloratadine (CLARINEX) 5 MG tablet Take 5 mg by mouth as needed.    . methocarbamol (ROBAXIN) 500 MG tablet Take by mouth.     No facility-administered medications prior to visit.     No Known Allergies  Review of Systems  Constitutional: Negative for chills, fever and malaise/fatigue.  HENT: Negative for congestion and hearing loss.   Eyes: Negative for discharge.  Respiratory: Negative for cough, sputum production and shortness of breath.   Cardiovascular: Negative for chest pain, palpitations and leg swelling.  Gastrointestinal: Negative for abdominal pain, blood in stool, constipation, diarrhea, heartburn, nausea and vomiting.  Genitourinary: Negative for dysuria, frequency, hematuria and urgency.  Musculoskeletal: Negative for back pain, falls and myalgias.  Skin: Negative for rash.  Neurological: Negative for dizziness, sensory change, loss of consciousness, weakness and headaches.  Endo/Heme/Allergies: Negative for environmental allergies. Does not bruise/bleed easily.  Psychiatric/Behavioral: Negative for depression and suicidal ideas. The patient is nervous/anxious. The patient does not have insomnia.        Objective:    Physical Exam  Constitutional:  She is oriented to person, place, and time. She appears well-developed and well-nourished. No distress.  HENT:  Head: Normocephalic and atraumatic.  Eyes: Conjunctivae are normal.  Neck: Neck supple. No thyromegaly present.  Cardiovascular: Normal rate, regular rhythm and normal heart sounds.  No murmur heard. Pulmonary/Chest: Effort normal and breath sounds normal. No respiratory distress.  Abdominal: Soft. Bowel sounds are normal. She exhibits no distension and no mass. There is no tenderness.  Musculoskeletal: She exhibits no edema.  Lymphadenopathy:    She has no cervical adenopathy.  Neurological: She is alert and oriented to person, place, and time.  Skin: Skin is warm and dry.  Psychiatric: She has a normal mood and affect. Her behavior is normal.    BP 98/76 (BP Location: Left Arm, Patient Position: Sitting, Cuff Size: Normal)   Pulse 67   Temp 97.8 F (36.6 C) (Oral)   Resp 18   Ht 5\' 9"  (1.753 m)   Wt 140 lb 6.4 oz (63.7 kg)   SpO2 97%   BMI 20.73 kg/m  Wt Readings from Last 3 Encounters:  06/25/17 140 lb 6.4 oz (63.7 kg)  10/04/16 143 lb 9.6 oz (65.1 kg)  09/24/16 142 lb 12.8 oz (64.8 kg)   BP Readings from Last 3 Encounters:  06/25/17 98/76  10/04/16 124/80  09/24/16 135/60     Immunization History  Administered Date(s) Administered  . Influenza Split 06/09/2012  . Influenza Whole 04/09/2011  . Influenza,inj,Quad PF,6+ Mos 05/17/2016  . Influenza,inj,Quad PF,6-35 Mos 04/22/2017  . Td 07/10/2011    Health Maintenance  Topic Date Due  . PAP SMEAR  03/09/2013  . TETANUS/TDAP  07/09/2021  . INFLUENZA VACCINE  Completed  . HIV Screening  Completed    Lab Results  Component Value Date   WBC 5.2 06/25/2017   HGB 13.2 06/25/2017   HCT 39.8 06/25/2017   PLT 130.0 (L) 06/25/2017   GLUCOSE 80 06/25/2017   CHOL 143 06/25/2017   TRIG 42.0 06/25/2017   HDL 72.60 06/25/2017   LDLCALC 62 06/25/2017   ALT 11 06/25/2017   AST 17 06/25/2017   NA 138  06/25/2017   K 3.8 06/25/2017   CL 101 06/25/2017   CREATININE 0.83 06/25/2017   BUN 10 06/25/2017   CO2 29 06/25/2017   TSH 1.04 06/25/2017    Lab Results  Component Value Date   TSH 1.04 06/25/2017   Lab Results  Component Value Date   WBC 5.2 06/25/2017   HGB 13.2 06/25/2017   HCT 39.8 06/25/2017   MCV 91.5 06/25/2017   PLT 130.0 (L) 06/25/2017   Lab Results  Component Value Date   NA 138 06/25/2017   K 3.8 06/25/2017   CO2 29 06/25/2017   GLUCOSE 80 06/25/2017   BUN 10 06/25/2017   CREATININE 0.83 06/25/2017   BILITOT 0.7 06/25/2017   ALKPHOS 46 06/25/2017   AST 17 06/25/2017   ALT 11 06/25/2017   PROT  7.4 06/25/2017   ALBUMIN 4.2 06/25/2017   CALCIUM 9.2 06/25/2017   GFR 78.69 06/25/2017   Lab Results  Component Value Date   CHOL 143 06/25/2017   Lab Results  Component Value Date   HDL 72.60 06/25/2017   Lab Results  Component Value Date   LDLCALC 62 06/25/2017   Lab Results  Component Value Date   TRIG 42.0 06/25/2017   Lab Results  Component Value Date   CHOLHDL 2 06/25/2017   No results found for: HGBA1C       Assessment & Plan:   Problem List Items Addressed This Visit    Preventative health care    Patient encouraged to maintain heart healthy diet, regular exercise, adequate sleep. Consider daily probiotics. Take medications as prescribed. Labs ordered      Relevant Orders   CBC (Completed)   Comprehensive metabolic panel (Completed)   Lipid panel (Completed)   TSH (Completed)   Anxiety    Situational in nature, will try Alprazolam 0.25 mg prn, is aware of the risk and would like to proceed if needs med frequently will consider daily med as well. She now cares for her aging parents and her young parents.       Relevant Medications   ALPRAZolam (XANAX) 0.25 MG tablet      I have changed Gail Sanchez desloratadine and methocarbamol. I am also having her start on ALPRAZolam. Additionally, I am having her maintain her  multivitamin and fluticasone.  Meds ordered this encounter  Medications  . desloratadine (CLARINEX) 5 MG tablet    Sig: Take 1 tablet (5 mg total) by mouth as needed.    Dispense:  30 tablet    Refill:  0  . methocarbamol (ROBAXIN) 500 MG tablet    Sig: Take 1 tablet (500 mg total) by mouth every 6 (six) hours as needed for muscle spasms.    Dispense:  30 tablet    Refill:  0  . ALPRAZolam (XANAX) 0.25 MG tablet    Sig: Take 1 tablet (0.25 mg total) by mouth 2 (two) times daily as needed for anxiety.    Dispense:  20 tablet    Refill:  2    CMA served as scribe during this visit. History, Physical and Plan performed by medical provider. Documentation and orders reviewed and attested to.  Danise EdgeStacey Blyth, MD

## 2017-07-23 NOTE — Telephone Encounter (Signed)
Error

## 2017-08-23 ENCOUNTER — Ambulatory Visit: Payer: BC Managed Care – PPO | Admitting: Family Medicine

## 2017-08-23 VITALS — BP 102/62 | HR 75 | Temp 98.1°F | Resp 18 | Wt 142.2 lb

## 2017-08-23 DIAGNOSIS — Z79899 Other long term (current) drug therapy: Secondary | ICD-10-CM | POA: Diagnosis not present

## 2017-08-23 DIAGNOSIS — F419 Anxiety disorder, unspecified: Secondary | ICD-10-CM | POA: Diagnosis not present

## 2017-08-23 MED ORDER — FLUTICASONE PROPIONATE 50 MCG/ACT NA SUSP: NASAL | 2 refills | Status: DC

## 2017-08-23 MED ORDER — DESLORATADINE 5 MG PO TABS: 5.0000 mg | ORAL_TABLET | ORAL | 1 refills | Status: DC | PRN

## 2017-08-23 NOTE — Progress Notes (Signed)
Subjective:  I acted as a Neurosurgeon for Dr. Abner Greenspan. Gail Sanchez, [place]  Patient ID: Gail Sanchez, female    DOB: Jan 10, 1971, 47 y.o.   MRN: [id number]  No chief complaint on file.   HPI  Patient is in today for a 2 month follow up. She has not acute concerns. No recent febrile illness or acute hospitalizations. Denies CP/palp/SOB/HA/congestion/fevers/GI or GU c/o. Taking meds as prescribed. She has used Alprazolam very infrequently since her last visit with good results and no concerning side effects.    Patient Care Team: Bradd Canary, MD as PCP - General (Family Medicine) Wandalee Ferdinand, MD as Referring Physician (Obstetrics and Gynecology)   Past Medical History:  Diagnosis Date  . Allergic state 06/09/2012  . Anxiety 06/25/2017  . Chicken pox as a child  . Fatigue 09/28/2011  . Low back pain 05/28/2015  . Onychomycosis 05/27/2016  . Preventative health care 06/09/2012  . 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 Vaginitis 12/27/2011    Past Surgical History:  Procedure Laterality Date  . cervix frozen      Family History  Problem Relation Age of Onset  . Cancer Mother   . Eczema Mother   . Hypertension Father   . Asthma Brother   . Allergic rhinitis Neg Hx   . Angioedema Neg Hx   . Immunodeficiency Neg Hx   . Urticaria Neg Hx     Social History   Socioeconomic History  . Marital status: Married    Spouse name: Not on file  . Number of children: Not on file  . Years of education: Not on file  . Highest education level: Not on file  Social Needs  . Financial resource strain: Not on file  . Food insecurity - worry: Not on file  . Food insecurity - inability: Not on file  . Transportation needs - medical: Not on file  . Transportation needs - non-medical: Not on file  Occupational History  . Occupation: Works with school   Tobacco Use  . Smoking status: Never Smoker  . Smokeless tobacco: Never Used  Substance and Sexual Activity    . Alcohol use: Yes    Comment: occ  . Drug use: No  . Sexual activity: Yes    Comment: lives with husband and children, no dietary restrictions, exercises each week  Other Topics Concern  . Not on file  Social History Narrative  . Not on file    Outpatient Medications Prior to Visit  Medication Sig Dispense Refill  . ALPRAZolam (XANAX) 0.25 MG tablet Take 1 tablet (0.25 mg total) by mouth 2 (two) times daily as needed for anxiety. 20 tablet 2  . methocarbamol (ROBAXIN) 500 MG tablet Take 1 tablet (500 mg total) by mouth every 6 (six) hours as needed for muscle spasms. 30 tablet 0  . Multiple Vitamin (MULTIVITAMIN) tablet Take 1 tablet by mouth daily.    Marland Kitchen desloratadine (CLARINEX) 5 MG tablet Take 1 tablet (5 mg total) by mouth as needed. 30 tablet 0  . fluticasone (FLONASE) 50 MCG/ACT nasal spray USE 2 SPRAYS INTO BOTH NOSTRILS DAILY 16 g 1   No facility-administered medications prior to visit.     No Known Allergies  Review of Systems  Constitutional: Negative for fever and malaise/fatigue.  HENT: Negative for congestion.   Eyes: Negative for blurred vision.  Respiratory: Negative for shortness of breath.   Cardiovascular: Negative for chest pain, palpitations and leg swelling.  Gastrointestinal: Negative for abdominal pain, blood in stool and nausea.  Genitourinary: Negative for dysuria and frequency.  Musculoskeletal: Negative for falls.  Skin: Negative for rash.  Neurological: Negative for dizziness, loss of consciousness and headaches.  Endo/Heme/Allergies: Negative for environmental allergies.  Psychiatric/Behavioral: Negative for depression. The patient is nervous/anxious.        Objective:    Physical Exam  Constitutional: She is oriented to person, place, and time. She appears well-developed and well-nourished. No distress.  HENT:  Head: Normocephalic and atraumatic.  Nose: Nose normal.  Eyes: Right eye exhibits no discharge. Left eye exhibits no discharge.   Neck: Normal range of motion. Neck supple.  Cardiovascular: Normal rate and regular rhythm.  No murmur heard. Pulmonary/Chest: Effort normal and breath sounds normal.  Abdominal: Soft. Bowel sounds are normal. There is no tenderness.  Musculoskeletal: She exhibits no edema.  Neurological: She is alert and oriented to person, place, and time.  Skin: Skin is warm and dry.  Psychiatric: She has a normal mood and affect.  Nursing note and vitals reviewed.   BP 102/62 (BP Location: Left Arm, Patient Position: Sitting, Cuff Size: Normal)   Pulse 75   Temp 98.1 F (36.7 C) (Oral)   Resp 18   Wt 142 lb 3.2 oz (64.5 kg)   SpO2 98%   BMI 21.00 kg/m  Wt Readings from Last 3 Encounters:  08/23/17 142 lb 3.2 oz (64.5 kg)  06/25/17 140 lb 6.4 oz (63.7 kg)  10/04/16 143 lb 9.6 oz (65.1 kg)   BP Readings from Last 3 Encounters:  08/23/17 102/62  06/25/17 98/76  10/04/16 124/80     Immunization History  Administered Date(s) Administered  . Influenza Split 06/09/2012  . Influenza Whole 04/09/2011  . Influenza,inj,Quad PF,6+ Mos 05/17/2016  . Influenza,inj,Quad PF,6-35 Mos 04/22/2017  . Td 07/10/2011    Health Maintenance  Topic Date Due  . PAP SMEAR  03/09/2013  . TETANUS/TDAP  07/09/2021  . INFLUENZA VACCINE  Completed  . HIV Screening  Completed    Lab Results  Component Value Date   WBC 5.2 06/25/2017   HGB 13.2 06/25/2017   HCT 39.8 06/25/2017   PLT 130.0 (L) 06/25/2017   GLUCOSE 80 06/25/2017   CHOL 143 06/25/2017   TRIG 42.0 06/25/2017   HDL 72.60 06/25/2017   LDLCALC 62 06/25/2017   ALT 11 06/25/2017   AST 17 06/25/2017   NA 138 06/25/2017   K 3.8 06/25/2017   CL 101 06/25/2017   CREATININE 0.83 06/25/2017   BUN 10 06/25/2017   CO2 29 06/25/2017   TSH 1.04 06/25/2017    Lab Results  Component Value Date   TSH 1.04 06/25/2017   Lab Results  Component Value Date   WBC 5.2 06/25/2017   HGB 13.2 06/25/2017   HCT 39.8 06/25/2017   MCV 91.5 06/25/2017    PLT 130.0 (L) 06/25/2017   Lab Results  Component Value Date   NA 138 06/25/2017   K 3.8 06/25/2017   CO2 29 06/25/2017   GLUCOSE 80 06/25/2017   BUN 10 06/25/2017   CREATININE 0.83 06/25/2017   BILITOT 0.7 06/25/2017   ALKPHOS 46 06/25/2017   AST 17 06/25/2017   ALT 11 06/25/2017   PROT 7.4 06/25/2017   ALBUMIN 4.2 06/25/2017   CALCIUM 9.2 06/25/2017   GFR 78.69 06/25/2017   Lab Results  Component Value Date   CHOL 143 06/25/2017   Lab Results  Component Value Date   HDL 72.60 06/25/2017  Lab Results  Component Value Date   LDLCALC 62 06/25/2017   Lab Results  Component Value Date   TRIG 42.0 06/25/2017   Lab Results  Component Value Date   CHOLHDL 2 06/25/2017   No results found for: HGBA1C       Assessment & Plan:   Problem List Items Addressed This Visit    Anxiety    She continues to care for her young children and older parents with a husband who travels a great deal. She has only needed a couple of doses of her Alprazolam and found them helpful whens he needs them. No concerning side effects. May continue to use prn.        Other Visit Diagnoses    High risk medication use    -  Primary   Relevant Orders   Pain Mgmt, Profile 8 w/Conf, U (Completed)      I am having Gail Sanchez maintain her multivitamin, methocarbamol, ALPRAZolam, desloratadine, and fluticasone.  Meds ordered this encounter  Medications  . desloratadine (CLARINEX) 5 MG tablet    Sig: Take 1 tablet (5 mg total) by mouth as needed.    Dispense:  90 tablet    Refill:  1  . fluticasone (FLONASE) 50 MCG/ACT nasal spray    Sig: USE 2 SPRAYS INTO BOTH NOSTRILS DAILY    Dispense:  16 g    Refill:  2    CMA served as scribe during this visit. History, Physical and Plan performed by medical provider. Documentation and orders reviewed and attested to.  Danise EdgeStacey Zyla Dascenzo, MD

## 2017-08-23 NOTE — Patient Instructions (Signed)
Generalized Anxiety, Adult Generalized anxiety disorder (GAD) is a mental health disorder. People with this condition constantly worry about everyday events. Unlike normal anxiety, worry related to GAD is not triggered by a specific event. These worries also do not fade or get better with time. GAD interferes with life functions, including relationships, work, and school. GAD can vary from mild to severe. People with severe GAD can have intense waves of anxiety with physical symptoms (panic attacks). What are the causes? The exact cause of GAD is not known. What increases the risk? This condition is more likely to develop in:  Women.  People who have a family history of anxiety disorders.  People who are very shy.  People who experience very stressful life events, such as the death of a loved one.  People who have a very stressful family environment.  What are the signs or symptoms? People with GAD often worry excessively about many things in their lives, such as their health and family. They may also be overly concerned about:  Doing well at work.  Being on time.  Natural disasters.  Friendships.  Physical symptoms of GAD include:  Fatigue.  Muscle tension or having muscle twitches.  Trembling or feeling shaky.  Being easily startled.  Feeling like your heart is pounding or racing.  Feeling out of breath or like you cannot take a deep breath.  Having trouble falling asleep or staying asleep.  Sweating.  Nausea, diarrhea, or irritable bowel syndrome (IBS).  Headaches.  Trouble concentrating or remembering facts.  Restlessness.  Irritability.  How is this diagnosed? Your health care provider can diagnose GAD based on your symptoms and medical history. You will also have a physical exam. The health care provider will ask specific questions about your symptoms, including how severe they are, when they started, and if they come and go. Your health care provider  may ask you about your use of alcohol or drugs, including prescription medicines. Your health care provider may refer you to a mental health specialist for further evaluation. Your health care provider will do a thorough examination and may perform additional tests to rule out other possible causes of your symptoms. To be diagnosed with GAD, a person must have anxiety that:  Is out of his or her control.  Affects several different aspects of his or her life, such as work and relationships.  Causes distress that makes him or her unable to take part in normal activities.  Includes at least three physical symptoms of GAD, such as restlessness, fatigue, trouble concentrating, irritability, muscle tension, or sleep problems.  Before your health care provider can confirm a diagnosis of GAD, these symptoms must be present more days than they are not, and they must last for six months or longer. How is this treated? The following therapies are usually used to treat GAD:  Medicine. Antidepressant medicine is usually prescribed for long-term daily control. Antianxiety medicines may be added in severe cases, especially when panic attacks occur.  Talk therapy (psychotherapy). Certain types of talk therapy can be helpful in treating GAD by providing support, education, and guidance. Options include: ? Cognitive behavioral therapy (CBT). People learn coping skills and techniques to ease their anxiety. They learn to identify unrealistic or negative thoughts and behaviors and to replace them with positive ones. ? Acceptance and commitment therapy (ACT). This treatment teaches people how to be mindful as a way to cope with unwanted thoughts and feelings. ? Biofeedback. This process trains you to manage  your body's response (physiological response) through breathing techniques and relaxation methods. You will work with a therapist while machines are used to monitor your physical symptoms.  Stress management  techniques. These include yoga, meditation, and exercise.  A mental health specialist can help determine which treatment is best for you. Some people see improvement with one type of therapy. However, other people require a combination of therapies. Follow these instructions at home:  Take over-the-counter and prescription medicines only as told by your health care provider.  Try to maintain a normal routine.  Try to anticipate stressful situations and allow extra time to manage them.  Practice any stress management or self-calming techniques as taught by your health care provider.  Do not punish yourself for setbacks or for not making progress.  Try to recognize your accomplishments, even if they are small.  Keep all follow-up visits as told by your health care provider. This is important. Contact a health care provider if:  Your symptoms do not get better.  Your symptoms get worse.  You have signs of depression, such as: ? A persistently sad, cranky, or irritable mood. ? Loss of enjoyment in activities that used to bring you joy. ? Change in weight or eating. ? Changes in sleeping habits. ? Avoiding friends or family members. ? Loss of energy for normal tasks. ? Feelings of guilt or worthlessness. Get help right away if:  You have serious thoughts about hurting yourself or others. If you ever feel like you may hurt yourself or others, or have thoughts about taking your own life, get help right away. You can go to your nearest emergency department or call:  Your local emergency services (911 in the [place]  A suicide crisis helpline, such as the National Suicide Prevention Lifeline at (404) 186-27801-(508) 005-0999. This is open 24 hours a day.  Summary  Generalized anxiety disorder (GAD) is a mental health disorder that involves worry that is not triggered by a specific event.  People with GAD often worry excessively about many things in their lives, such as their health and  family.  GAD may cause physical symptoms such as restlessness, trouble concentrating, sleep problems, frequent sweating, nausea, diarrhea, headaches, and trembling or muscle twitching.  A mental health specialist can help determine which treatment is best for you. Some people see improvement with one type of therapy. However, other people require a combination of therapies. This information is not intended to replace advice given to you by your health care provider. Make sure you discuss any questions you have with your health care provider. Document Released: 10/20/2012 Document Revised: 05/15/2016 Document Reviewed: 05/15/2016 Elsevier Interactive Patient Education  [employer]2018 [employer]

## 2017-08-24 LAB — PAIN MGMT, PROFILE 8 W/CONF, U
6 Acetylmorphine: NEGATIVE ng/mL (ref <10)
AMPHETAMINES: NEGATIVE ng/mL (ref <500)
Alcohol Metabolites: NEGATIVE ng/mL (ref <500)
BUPRENORPHINE, URINE: NEGATIVE ng/mL (ref <5)
Benzodiazepines: NEGATIVE ng/mL (ref <100)
Cocaine Metabolite: NEGATIVE ng/mL (ref <150)
Creatinine: 179.8 mg/dL
MDMA: NEGATIVE ng/mL (ref <500)
Marijuana Metabolite: NEGATIVE ng/mL (ref <20)
OXIDANT: NEGATIVE ug/mL (ref <200)
OXYCODONE: NEGATIVE ng/mL (ref <100)
Opiates: NEGATIVE ng/mL (ref <100)
pH: 6.52 (ref 4.5–9.0)

## 2017-08-25 NOTE — Assessment & Plan Note (Signed)
She continues to care for her young children and older parents with a husband who travels a great deal. She has only needed a couple of doses of her Alprazolam and found them helpful whens he needs them. No concerning side effects. May continue to use prn.

## 2018-05-06 ENCOUNTER — Encounter: Payer: Self-pay | Admitting: Nurse Practitioner

## 2018-05-06 ENCOUNTER — Other Ambulatory Visit (HOSPITAL_COMMUNITY)
Admission: RE | Admit: 2018-05-06 | Discharge: 2018-05-06 | Disposition: A | Discharge status: Home / Self Care | Payer: BC Managed Care – PPO | Source: Ambulatory Visit | Attending: Nurse Practitioner | Admitting: Nurse Practitioner

## 2018-05-06 ENCOUNTER — Ambulatory Visit: Payer: BC Managed Care – PPO | Admitting: Nurse Practitioner

## 2018-05-06 VITALS — BP 122/76 | HR 85 | Temp 98.6°F | Ht 69.0 in | Wt 142.0 lb

## 2018-05-06 DIAGNOSIS — R350 Frequency of micturition: Secondary | ICD-10-CM | POA: Diagnosis not present

## 2018-05-06 DIAGNOSIS — Z23 Encounter for immunization: Secondary | ICD-10-CM

## 2018-05-06 DIAGNOSIS — N898 Other specified noninflammatory disorders of vagina: Secondary | ICD-10-CM | POA: Insufficient documentation

## 2018-05-06 DIAGNOSIS — N3000 Acute cystitis without hematuria: Secondary | ICD-10-CM | POA: Diagnosis not present

## 2018-05-06 LAB — POCT URINALYSIS DIPSTICK
Bilirubin, UA: NEGATIVE
Blood, UA: NEGATIVE
GLUCOSE UA: NEGATIVE
Ketones, UA: NEGATIVE
Nitrite, UA: NEGATIVE
Protein, UA: NEGATIVE
SPEC GRAV UA: 1.01 (ref 1.010–1.025)
Urobilinogen, UA: 0.2 E.U./dL
pH, UA: 6.5 (ref 5.0–8.0)

## 2018-05-06 MED ORDER — FLUCONAZOLE 150 MG PO TABS: 150.0000 mg | ORAL_TABLET | Freq: Every day | ORAL | 0 refills | Status: DC

## 2018-05-06 NOTE — Progress Notes (Addendum)
Subjective:  Patient ID: Gail Sanchez, female    DOB: 02-09-1971  Age: 47 y.o. MRN: [id number]  CC: Urinary Tract Infection (pt is complaining of frequent urinate,itchy,odor. going on 1 wk. took azo/went to urgent care last week--urine was normal)  Urinary Tract Infection   This is a new problem. The current episode started in the past 7 days. The problem occurs intermittently. The problem has been waxing and waning. The patient is experiencing no pain. There has been no fever. She is sexually active. There is no history of pyelonephritis. Associated symptoms include frequency and urgency. Pertinent negatives include no chills, discharge, flank pain, hematuria, hesitancy, nausea, possible pregnancy, sweats or vomiting. She has tried increased fluids for the symptoms. There is no history of kidney stones, recurrent UTIs or urinary stasis.  no constipation, no diarrhea No UTI in last 75yrs. Use of monistat x 2days and no improvement in vaginal itching. Sexually active with husband only. Husband had vasectomy  Reviewed past Medical, Social and Family history today.  Outpatient Medications Prior to Visit  Medication Sig Dispense Refill  . ALPRAZolam (XANAX) 0.25 MG tablet Take 1 tablet (0.25 mg total) by mouth 2 (two) times daily as needed for anxiety. 20 tablet 2  . desloratadine (CLARINEX) 5 MG tablet Take 1 tablet (5 mg total) by mouth as needed. 90 tablet 1  . fluticasone (FLONASE) 50 MCG/ACT nasal spray USE 2 SPRAYS INTO BOTH NOSTRILS DAILY 16 g 2  . methocarbamol (ROBAXIN) 500 MG tablet Take 1 tablet (500 mg total) by mouth every 6 (six) hours as needed for muscle spasms. 30 tablet 0  . Multiple Vitamin (MULTIVITAMIN) tablet Take 1 tablet by mouth daily.     No facility-administered medications prior to visit.     ROS See HPI  Objective:  BP 122/76   Pulse 85   Temp 98.6 F (37 C) (Oral)   Ht 5\' 9"  (1.753 m)   Wt 142 lb (64.4 kg)   LMP 04/17/2018 (Exact Date)   SpO2 99%    BMI 20.97 kg/m   BP Readings from Last 3 Encounters:  05/06/18 122/76  08/23/17 102/62  06/25/17 98/76    Wt Readings from Last 3 Encounters:  05/06/18 142 lb (64.4 kg)  08/23/17 142 lb 3.2 oz (64.5 kg)  06/25/17 140 lb 6.4 oz (63.7 kg)    Physical Exam  Constitutional: She is oriented to person, place, and time. She appears well-developed and well-nourished.  Cardiovascular: Normal rate.  Pulmonary/Chest: Effort normal.  Abdominal: Soft. There is no tenderness.  No flank pain  Genitourinary: Rectal exam shows external hemorrhoid. Pelvic exam was performed with patient supine. There is no rash or tenderness on the right labia. There is no rash, tenderness or lesion on the left labia. Cervix exhibits no motion tenderness, no discharge and no friability. No erythema, tenderness or bleeding in the vagina. No vaginal discharge found.  Genitourinary Comments: Chaperone present Vaginal wall atrophy  Neurological: She is alert and oriented to person, place, and time.  Vitals reviewed.   Lab Results  Component Value Date   WBC 5.2 06/25/2017   HGB 13.2 06/25/2017   HCT 39.8 06/25/2017   PLT 130.0 (L) 06/25/2017   GLUCOSE 80 06/25/2017   CHOL 143 06/25/2017   TRIG 42.0 06/25/2017   HDL 72.60 06/25/2017   LDLCALC 62 06/25/2017   ALT 11 06/25/2017   AST 17 06/25/2017   NA 138 06/25/2017   K 3.8 06/25/2017   CL 101 06/25/2017  CREATININE 0.83 06/25/2017   BUN 10 06/25/2017   CO2 29 06/25/2017   TSH 1.04 06/25/2017    Mm Digital Screening Bilateral  Result Date: 06/25/2017 CLINICAL DATA:  Screening. EXAM: DIGITAL SCREENING BILATERAL MAMMOGRAM WITH CAD COMPARISON:  Previous exam(s). ACR Breast Density Category c: The breast tissue is heterogeneously dense, which may obscure small masses. FINDINGS: There are no findings suspicious for malignancy. Images were processed with CAD. IMPRESSION: No mammographic evidence of malignancy. A result letter of this screening mammogram  will be mailed directly to the patient. RECOMMENDATION: Screening mammogram in one year. (Code:[id number]) BI-RADS CATEGORY  1: Negative. Electronically Signed   By: Bary Richard M.D.   On: 06/25/2017 12:24    Assessment & Plan:   Gail Sanchez was seen today for urinary tract infection.  Diagnoses and all orders for this visit:  Acute cystitis without hematuria -     POCT urinalysis dipstick -     Urine Culture -     Discontinue: ciprofloxacin (CIPRO) 250 MG tablet; Take 1 tablet (250 mg total) by mouth 2 (two) times daily. -     ciprofloxacin (CIPRO) 250 MG tablet; Take 1 tablet (250 mg total) by mouth 2 (two) times daily.  Itching in the vaginal area -     Cervicovaginal ancillary only( [place]) -     fluconazole (DIFLUCAN) 150 MG tablet; Take 1 tablet (150 mg total) by mouth daily. Take second tab 3days apart from first tab  Need for influenza vaccination -     Flu Vaccine QUAD 36+ mos IM   I am having Gail Sanchez start on fluconazole. I am also having her maintain her multivitamin, methocarbamol, ALPRAZolam, desloratadine, fluticasone, and ciprofloxacin.  Meds ordered this encounter  Medications  . fluconazole (DIFLUCAN) 150 MG tablet    Sig: Take 1 tablet (150 mg total) by mouth daily. Take second tab 3days apart from first tab    Dispense:  2 tablet    Refill:  0    Order Specific Question:   Supervising Provider    Answer:   Dianne Dun [[id number]]  . DISCONTD: ciprofloxacin (CIPRO) 250 MG tablet    Sig: Take 1 tablet (250 mg total) by mouth 2 (two) times daily.    Dispense:  6 tablet    Refill:  0    Order Specific Question:   Supervising Provider    Answer:   Dianne Dun [[id number]]  . ciprofloxacin (CIPRO) 250 MG tablet    Sig: Take 1 tablet (250 mg total) by mouth 2 (two) times daily.    Dispense:  8 tablet    Refill:  0    Additional tabs to complete 7days course    Order Specific Question:   Supervising Provider    Answer:   Dianne Dun [[id number]]     Follow-up: Return if symptoms worsen or fail to improve.  Alysia Penna, NP

## 2018-05-06 NOTE — Patient Instructions (Signed)
You will be contacted with vaginal swab and urine culture results.

## 2018-05-07 LAB — CERVICOVAGINAL ANCILLARY ONLY
BACTERIAL VAGINITIS: NEGATIVE
Candida vaginitis: NEGATIVE
Chlamydia: NEGATIVE
Neisseria Gonorrhea: NEGATIVE
TRICH (WINDOWPATH): NEGATIVE

## 2018-05-08 LAB — URINE CULTURE
MICRO NUMBER:: 91300069
SPECIMEN QUALITY: ADEQUATE

## 2018-05-08 MED ORDER — CIPROFLOXACIN HCL 250 MG PO TABS: 250.0000 mg | ORAL_TABLET | Freq: Two times a day (BID) | ORAL | 0 refills | Status: DC

## 2018-05-08 NOTE — Addendum Note (Signed)
Addended by: Alysia Penna L on: 05/08/2018 02:13 PM   Modules accepted: Orders

## 2018-05-08 NOTE — Addendum Note (Signed)
Addended by: Michaela Corner on: 05/08/2018 08:23 AM   Modules accepted: Orders

## 2018-05-09 ENCOUNTER — Encounter: Payer: Self-pay | Admitting: Family Medicine

## 2018-05-12 ENCOUNTER — Other Ambulatory Visit: Payer: Self-pay | Admitting: Family Medicine

## 2018-05-12 DIAGNOSIS — Z1231 Encounter for screening mammogram for malignant neoplasm of breast: Secondary | ICD-10-CM

## 2018-06-17 LAB — RESULTS CONSOLE HPV: CHL HPV: NEGATIVE

## 2018-06-17 LAB — HM PAP SMEAR: HM Pap smear: NORMAL

## 2018-08-04 ENCOUNTER — Inpatient Hospital Stay (HOSPITAL_BASED_OUTPATIENT_CLINIC_OR_DEPARTMENT_OTHER): Admission: RE | Admit: 2018-08-04 | Payer: BC Managed Care – PPO | Source: Ambulatory Visit

## 2018-08-04 ENCOUNTER — Encounter: Payer: Self-pay | Admitting: Family Medicine

## 2018-08-04 ENCOUNTER — Ambulatory Visit (INDEPENDENT_AMBULATORY_CARE_PROVIDER_SITE_OTHER): Payer: BC Managed Care – PPO | Admitting: Family Medicine

## 2018-08-04 VITALS — BP 116/64 | HR 70 | Temp 97.7°F | Resp 18 | Ht 69.0 in | Wt 141.0 lb

## 2018-08-04 DIAGNOSIS — Z79899 Other long term (current) drug therapy: Secondary | ICD-10-CM | POA: Diagnosis not present

## 2018-08-04 DIAGNOSIS — Z0000 Encounter for general adult medical examination without abnormal findings: Secondary | ICD-10-CM

## 2018-08-04 DIAGNOSIS — F419 Anxiety disorder, unspecified: Secondary | ICD-10-CM

## 2018-08-04 DIAGNOSIS — T7840XD Allergy, unspecified, subsequent encounter: Secondary | ICD-10-CM

## 2018-08-04 DIAGNOSIS — R002 Palpitations: Secondary | ICD-10-CM

## 2018-08-04 LAB — COMPREHENSIVE METABOLIC PANEL
ALT: 10 U/L (ref 0–35)
AST: 13 U/L (ref 0–37)
Albumin: 4.3 g/dL (ref 3.5–5.2)
Alkaline Phosphatase: 54 U/L (ref 39–117)
BUN: 10 mg/dL (ref 6–23)
CO2: 30 meq/L (ref 19–32)
Calcium: 9.7 mg/dL (ref 8.4–10.5)
Chloride: 103 mEq/L (ref 96–112)
Creatinine, Ser: 0.89 mg/dL (ref 0.40–1.20)
GFR: 67.97 mL/min (ref >60.00)
GLUCOSE: 83 mg/dL (ref 70–99)
Potassium: 3.8 mEq/L (ref 3.5–5.1)
Sodium: 141 mEq/L (ref 135–145)
Total Bilirubin: 0.7 mg/dL (ref 0.2–1.2)
Total Protein: 7.1 g/dL (ref 6.0–8.3)

## 2018-08-04 LAB — CBC
HCT: 38.3 % (ref 36.0–46.0)
Hemoglobin: 12.9 g/dL (ref 12.0–15.0)
MCHC: 33.7 g/dL (ref 30.0–36.0)
MCV: 90.1 fl (ref 78.0–100.0)
Platelets: 149 10*3/uL — ABNORMAL LOW (ref 150.0–400.0)
RBC: 4.25 Mil/uL (ref 3.87–5.11)
RDW: 12.4 % (ref 11.5–15.5)
WBC: 4.7 10*3/uL (ref 4.0–10.5)

## 2018-08-04 LAB — TSH: TSH: 1.74 u[IU]/mL (ref 0.35–4.50)

## 2018-08-04 LAB — LIPID PANEL
Cholesterol: 156 mg/dL (ref 0–200)
HDL: 74.3 mg/dL (ref >39.00)
LDL Cholesterol: 73 mg/dL (ref 0–99)
NONHDL: 81.88
Total CHOL/HDL Ratio: 2
Triglycerides: 42 mg/dL (ref 0.0–149.0)
VLDL: 8.4 mg/dL (ref 0.0–40.0)

## 2018-08-04 NOTE — Assessment & Plan Note (Addendum)
Has had congestion and some pressure in the right ear and some itchy nose. Consider antihistamine and/or nasal saline and Flonase.

## 2018-08-04 NOTE — Assessment & Plan Note (Addendum)
Patient encouraged to maintain heart healthy diet, regular exercise, adequate sleep. Consider daily probiotics. Take medications as prescribed. Given and reviewed copy of ACP documents from [employer] and encouraged to complete and return. Labs ordered

## 2018-08-04 NOTE — Patient Instructions (Signed)
Preventive Care 40-64 Years, Female Preventive care refers to lifestyle choices and visits with your health care provider that can promote health and wellness. What does preventive care include?   A yearly physical exam. This is also called an annual well check.  Dental exams once or twice a year.  Routine eye exams. Ask your health care provider how often you should have your eyes checked.  Personal lifestyle choices, including: ? Daily care of your teeth and gums. ? Regular physical activity. ? Eating a healthy diet. ? Avoiding tobacco and drug use. ? Limiting alcohol use. ? Practicing safe sex. ? Taking low-dose aspirin daily starting at age 50. ? Taking vitamin and mineral supplements as recommended by your health care provider. What happens during an annual well check? The services and screenings done by your health care provider during your annual well check will depend on your age, overall health, lifestyle risk factors, and family history of disease. Counseling Your health care provider may ask you questions about your:  Alcohol use.  Tobacco use.  Drug use.  Emotional well-being.  Home and relationship well-being.  Sexual activity.  Eating habits.  Work and work environment.  Method of birth control.  Menstrual cycle.  Pregnancy history. Screening You may have the following tests or measurements:  Height, weight, and BMI.  Blood pressure.  Lipid and cholesterol levels. These may be checked every 5 years, or more frequently if you are over 50 years old.  Skin check.  Lung cancer screening. You may have this screening every year starting at age 55 if you have a 30-pack-year history of smoking and currently smoke or have quit within the past 15 years.  Colorectal cancer screening. All adults should have this screening starting at age 50 and continuing until age 75. Your health care provider may recommend screening at age 45. You will have tests every  1-10 years, depending on your results and the type of screening test. People at increased risk should start screening at an earlier age. Screening tests may include: ? Guaiac-based fecal occult blood testing. ? Fecal immunochemical test (FIT). ? Stool DNA test. ? Virtual colonoscopy. ? Sigmoidoscopy. During this test, a flexible tube with a tiny camera (sigmoidoscope) is used to examine your rectum and lower colon. The sigmoidoscope is inserted through your anus into your rectum and lower colon. ? Colonoscopy. During this test, a long, thin, flexible tube with a tiny camera (colonoscope) is used to examine your entire colon and rectum.  Hepatitis C blood test.  Hepatitis B blood test.  Sexually transmitted disease (STD) testing.  Diabetes screening. This is done by checking your blood sugar (glucose) after you have not eaten for a while (fasting). You may have this done every 1-3 years.  Mammogram. This may be done every 1-2 years. Talk to your health care provider about when you should start having regular mammograms. This may depend on whether you have a family history of breast cancer.  BRCA-related cancer screening. This may be done if you have a family history of breast, ovarian, tubal, or peritoneal cancers.  Pelvic exam and Pap test. This may be done every 3 years starting at age 21. Starting at age 30, this may be done every 5 years if you have a Pap test in combination with an HPV test.  Bone density scan. This is done to screen for osteoporosis. You may have this scan if you are at high risk for osteoporosis. Discuss your test results, treatment options,   and if necessary, the need for more tests with your health care provider. Vaccines Your health care provider may recommend certain vaccines, such as:  Influenza vaccine. This is recommended every year.  Tetanus, diphtheria, and acellular pertussis (Tdap, Td) vaccine. You may need a Td booster every 10 years.  Varicella  vaccine. You may need this if you have not been vaccinated.  Zoster vaccine. You may need this after age 38.  Measles, mumps, and rubella (MMR) vaccine. You may need at least one dose of MMR if you were born in 1957 or later. You may also need a second dose.  Pneumococcal 13-valent conjugate (PCV13) vaccine. You may need this if you have certain conditions and were not previously vaccinated.  Pneumococcal polysaccharide (PPSV23) vaccine. You may need one or two doses if you smoke cigarettes or if you have certain conditions.  Meningococcal vaccine. You may need this if you have certain conditions.  Hepatitis A vaccine. You may need this if you have certain conditions or if you travel or work in places where you may be exposed to hepatitis A.  Hepatitis B vaccine. You may need this if you have certain conditions or if you travel or work in places where you may be exposed to hepatitis B.  Haemophilus influenzae type b (Hib) vaccine. You may need this if you have certain conditions. Talk to your health care provider about which screenings and vaccines you need and how often you need them. This information is not intended to replace advice given to you by your health care provider. Make sure you discuss any questions you have with your health care provider. Document Released: 07/22/2015 Document Revised: 08/15/2017 Document Reviewed: 04/26/2015 Elsevier Interactive Patient Education  2019 [employer].

## 2018-08-04 NOTE — Assessment & Plan Note (Signed)
Doing well with only occasional need for Alprazolam. No concerns with meds and UDS and contract UTD

## 2018-08-04 NOTE — Assessment & Plan Note (Signed)
Continues to struggle with intermittent sense of palpitations. Had a sense of it beating vigorously on Saturday but the rates stayed in the 80s and she does notes she took Sudafed for congestion this week. Stop Sudafed and monitor

## 2018-08-04 NOTE — Progress Notes (Signed)
Subjective:    Patient ID: Gail Sanchez, female    DOB: [birth year], 48 y.o.   MRN: [id number]  No chief complaint on file.   HPI Patient is in today for annual preventative exam and follow up on chronic medical concerns including anxiety and palpitations. She feels well. No recent febrile illness or hospitalizations. She had an episode of palpitations on Saturday but she had taken some sudafed for some nasal congestion and ear pressure. No fevers or chills. Doing well with activities of daily living. Is eating a heart healthy diet and stays active. Denies CP/SOB/HA/congestion/fevers/GI or GU c/o. Taking meds as prescribed  Past Medical History:  Diagnosis Date  . Allergic state 06/09/2012  . Anxiety 06/25/2017  . Chicken pox as a child  . Fatigue 09/28/2011  . Low back pain 05/28/2015  . Onychomycosis 05/27/2016  . Preventative health care 06/09/2012  . 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 Vaginitis 12/27/2011    Past Surgical History:  Procedure Laterality Date  . cervix frozen      Family History  Problem Relation Age of Onset  . Cancer Mother   . Eczema Mother   . Hypertension Father   . Asthma Brother   . Allergic rhinitis Neg Hx   . Angioedema Neg Hx   . Immunodeficiency Neg Hx   . Urticaria Neg Hx     Social History   Socioeconomic History  . Marital status: Married    Spouse name: Not on file  . Number of children: Not on file  . Years of education: Not on file  . Highest education level: Not on file  Occupational History  . Occupation: Works with school   Social Needs  . Financial resource strain: Not on file  . Food insecurity:    Worry: Not on file    Inability: Not on file  . Transportation needs:    Medical: Not on file    Non-medical: Not on file  Tobacco Use  . Smoking status: Never Smoker  . Smokeless tobacco: Never Used  Substance and Sexual Activity  . Alcohol use: Yes    Comment: occ  . Drug use: No  .  Sexual activity: Yes    Birth control/protection: Other-see comments    Comment: husband with vasectomy  Lifestyle  . Physical activity:    Days per week: Not on file    Minutes per session: Not on file  . Stress: Not on file  Relationships  . Social connections:    Talks on phone: Not on file    Gets together: Not on file    Attends religious service: Not on file    Active member of club or organization: Not on file    Attends meetings of clubs or organizations: Not on file    Relationship status: Not on file  . Intimate partner violence:    Fear of current or ex partner: Not on file    Emotionally abused: Not on file    Physically abused: Not on file    Forced sexual activity: Not on file  Other Topics Concern  . Not on file  Social History Narrative  . Not on file    Outpatient Medications Prior to Visit  Medication Sig Dispense Refill  . ALPRAZolam (XANAX) 0.25 MG tablet Take 1 tablet (0.25 mg total) by mouth 2 (two) times daily as needed for anxiety. 20 tablet 2  . desloratadine (CLARINEX) 5 MG tablet Take  1 tablet (5 mg total) by mouth as needed. 90 tablet 1  . fluticasone (FLONASE) 50 MCG/ACT nasal spray USE 2 SPRAYS INTO BOTH NOSTRILS DAILY 16 g 2  . methocarbamol (ROBAXIN) 500 MG tablet Take 1 tablet (500 mg total) by mouth every 6 (six) hours as needed for muscle spasms. 30 tablet 0  . Multiple Vitamin (MULTIVITAMIN) tablet Take 1 tablet by mouth daily.    . ciprofloxacin (CIPRO) 250 MG tablet Take 1 tablet (250 mg total) by mouth 2 (two) times daily. 8 tablet 0  . fluconazole (DIFLUCAN) 150 MG tablet Take 1 tablet (150 mg total) by mouth daily. Take second tab 3days apart from first tab 2 tablet 0   No facility-administered medications prior to visit.     No Known Allergies  Review of Systems  Constitutional: Negative for chills, fever and malaise/fatigue.  HENT: Negative for congestion and hearing loss.   Eyes: Negative for discharge.  Respiratory: Negative  for cough, sputum production and shortness of breath.   Cardiovascular: Positive for palpitations. Negative for chest pain and leg swelling.  Gastrointestinal: Negative for abdominal pain, blood in stool, constipation, diarrhea, heartburn, nausea and vomiting.  Genitourinary: Negative for dysuria, frequency, hematuria and urgency.  Musculoskeletal: Negative for back pain, falls and myalgias.  Skin: Negative for rash.  Neurological: Negative for dizziness, sensory change, loss of consciousness, weakness and headaches.  Endo/Heme/Allergies: Negative for environmental allergies. Does not bruise/bleed easily.  Psychiatric/Behavioral: Negative for depression and suicidal ideas. The patient is not nervous/anxious and does not have insomnia.        Objective:    Physical Exam Constitutional:      General: She is not in acute distress.    Appearance: She is not diaphoretic.  HENT:     Head: Normocephalic and atraumatic.     Right Ear: External ear normal.     Left Ear: External ear normal.     Nose: Nose normal.     Mouth/Throat:     Pharynx: No oropharyngeal exudate.  Eyes:     General: No scleral icterus.       Right eye: No discharge.        Left eye: No discharge.     Conjunctiva/sclera: Conjunctivae normal.     Pupils: Pupils are equal, round, and reactive to light.  Neck:     Musculoskeletal: Normal range of motion and neck supple.     Thyroid: No thyromegaly.  Cardiovascular:     Rate and Rhythm: Normal rate and regular rhythm.     Heart sounds: Normal heart sounds. No murmur.  Pulmonary:     Effort: Pulmonary effort is normal. No respiratory distress.     Breath sounds: Normal breath sounds. No wheezing or rales.  Abdominal:     General: Bowel sounds are normal. There is no distension.     Palpations: Abdomen is soft. There is no mass.     Tenderness: There is no abdominal tenderness.  Musculoskeletal: Normal range of motion.        General: No tenderness.    Lymphadenopathy:     Cervical: No cervical adenopathy.  Skin:    General: Skin is warm and dry.     Findings: No rash.  Neurological:     Mental Status: She is alert and oriented to person, place, and time.     Cranial Nerves: No cranial nerve deficit.     Coordination: Coordination normal.     Deep Tendon Reflexes: Reflexes are normal and  symmetric. Reflexes normal.     BP 116/64 (BP Location: Left Arm, Patient Position: Sitting, Cuff Size: Normal)   Pulse 70   Temp 97.7 F (36.5 C) (Oral)   Resp 18   Ht 5\' 9"  (1.753 m)   Wt 141 lb (64 kg)   SpO2 96%   BMI 20.82 kg/m  Wt Readings from Last 3 Encounters:  08/04/18 141 lb (64 kg)  05/06/18 142 lb (64.4 kg)  08/23/17 142 lb 3.2 oz (64.5 kg)     Lab Results  Component Value Date   WBC 5.2 06/25/2017   HGB 13.2 06/25/2017   HCT 39.8 06/25/2017   PLT 130.0 (L) 06/25/2017   GLUCOSE 80 06/25/2017   CHOL 143 06/25/2017   TRIG 42.0 06/25/2017   HDL 72.60 06/25/2017   LDLCALC 62 06/25/2017   ALT 11 06/25/2017   AST 17 06/25/2017   NA 138 06/25/2017   K 3.8 06/25/2017   CL 101 06/25/2017   CREATININE 0.83 06/25/2017   BUN 10 06/25/2017   CO2 29 06/25/2017   TSH 1.04 06/25/2017    Lab Results  Component Value Date   TSH 1.04 06/25/2017   Lab Results  Component Value Date   WBC 5.2 06/25/2017   HGB 13.2 06/25/2017   HCT 39.8 06/25/2017   MCV 91.5 06/25/2017   PLT 130.0 (L) 06/25/2017   Lab Results  Component Value Date   NA 138 06/25/2017   K 3.8 06/25/2017   CO2 29 06/25/2017   GLUCOSE 80 06/25/2017   BUN 10 06/25/2017   CREATININE 0.83 06/25/2017   BILITOT 0.7 06/25/2017   ALKPHOS 46 06/25/2017   AST 17 06/25/2017   ALT 11 06/25/2017   PROT 7.4 06/25/2017   ALBUMIN 4.2 06/25/2017   CALCIUM 9.2 06/25/2017   GFR 78.69 06/25/2017   Lab Results  Component Value Date   CHOL 143 06/25/2017   Lab Results  Component Value Date   HDL 72.60 06/25/2017   Lab Results  Component Value Date    LDLCALC 62 06/25/2017   Lab Results  Component Value Date   TRIG 42.0 06/25/2017   Lab Results  Component Value Date   CHOLHDL 2 06/25/2017   No results found for: HGBA1C     Assessment & Plan:   Problem List Items Addressed This Visit    Preventative health care    Patient encouraged to maintain heart healthy diet, regular exercise, adequate sleep. Consider daily probiotics. Take medications as prescribed. Given and reviewed copy of ACP documents from [employer] and encouraged to complete and return. Labs ordered      Relevant Orders   Comprehensive metabolic panel   Lipid panel   TSH   Allergy    Has had congestion and some pressure in the right ear and some itchy nose. Consider antihistamine and/or nasal saline and Flonase.      Relevant Orders   CBC   Palpitation    Continues to struggle with intermittent sense of palpitations. Had a sense of it beating vigorously on Saturday but the rates stayed in the 80s and she does notes she took Sudafed for congestion this week. Stop Sudafed and monitor      Relevant Orders   CBC   Comprehensive metabolic panel   TSH   Anxiety    Doing well with only occasional need for Alprazolam. No concerns with meds and UDS and contract UTD       Other Visit Diagnoses  High risk medication use    -  Primary   Relevant Orders   Pain Mgmt, Profile 8 w/Conf, U      I have discontinued Zaila C. Cuadros's fluconazole and ciprofloxacin. I am also having her maintain her multivitamin, methocarbamol, ALPRAZolam, desloratadine, and fluticasone.  No orders of the defined types were placed in this encounter.    Danise EdgeStacey , MD

## 2018-08-05 LAB — PAIN MGMT, PROFILE 8 W/CONF, U
6 Acetylmorphine: NEGATIVE ng/mL (ref <10)
Alcohol Metabolites: NEGATIVE ng/mL (ref <500)
Amphetamines: NEGATIVE ng/mL (ref <500)
Benzodiazepines: NEGATIVE ng/mL (ref <100)
Buprenorphine, Urine: NEGATIVE ng/mL (ref <5)
CREATININE: 217.1 mg/dL
Cocaine Metabolite: NEGATIVE ng/mL (ref <150)
MARIJUANA METABOLITE: NEGATIVE ng/mL (ref <20)
MDMA: NEGATIVE ng/mL (ref <500)
Opiates: NEGATIVE ng/mL (ref <100)
Oxidant: NEGATIVE ug/mL (ref <200)
Oxycodone: NEGATIVE ng/mL (ref <100)
PH: 6.56 (ref 4.5–9.0)

## 2018-09-01 ENCOUNTER — Ambulatory Visit (HOSPITAL_BASED_OUTPATIENT_CLINIC_OR_DEPARTMENT_OTHER)
Admission: RE | Admit: 2018-09-01 | Discharge: 2018-09-01 | Disposition: A | Discharge status: Home / Self Care | Payer: BC Managed Care – PPO | Source: Ambulatory Visit | Attending: Family Medicine | Admitting: Family Medicine

## 2018-09-01 ENCOUNTER — Encounter (HOSPITAL_BASED_OUTPATIENT_CLINIC_OR_DEPARTMENT_OTHER): Payer: Self-pay

## 2018-09-01 DIAGNOSIS — Z1231 Encounter for screening mammogram for malignant neoplasm of breast: Secondary | ICD-10-CM

## 2018-09-23 ENCOUNTER — Encounter: Payer: Self-pay | Admitting: Family Medicine

## 2018-09-23 MED ORDER — FLUTICASONE PROPIONATE 50 MCG/ACT NA SUSP: NASAL | 5 refills | Status: DC

## 2019-02-02 ENCOUNTER — Other Ambulatory Visit: Payer: Self-pay

## 2019-02-02 ENCOUNTER — Ambulatory Visit: Payer: BC Managed Care – PPO | Admitting: Family Medicine

## 2019-04-28 ENCOUNTER — Other Ambulatory Visit (HOSPITAL_BASED_OUTPATIENT_CLINIC_OR_DEPARTMENT_OTHER): Payer: Self-pay | Admitting: Family Medicine

## 2019-04-28 DIAGNOSIS — Z1231 Encounter for screening mammogram for malignant neoplasm of breast: Secondary | ICD-10-CM

## 2019-07-13 ENCOUNTER — Encounter: Payer: Self-pay | Admitting: Family Medicine

## 2019-07-16 ENCOUNTER — Other Ambulatory Visit: Payer: Self-pay | Admitting: Family Medicine

## 2019-07-16 MED ORDER — NITROFURANTOIN MONOHYD MACRO 100 MG PO CAPS: 100.0000 mg | ORAL_CAPSULE | Freq: Two times a day (BID) | ORAL | 0 refills | Status: DC

## 2019-07-16 NOTE — Progress Notes (Unsigned)
macrobi

## 2019-07-21 ENCOUNTER — Ambulatory Visit: Payer: Self-pay | Admitting: *Deleted

## 2019-07-21 NOTE — Telephone Encounter (Signed)
The pt called with complaints of bilateral eye pain which started around 0400; she says her eyes are sensitive to light, and watering a little; the pt says she is taking nitrofurantoin which she started on 07/19/19; the pt says she is wearing sun glasses due to her sensitivity to light; recommendations made per nurse triage protocol; she verbalized understanding and would prefer to be seen in the office as opposed to going to urgent care; the pt sees Dr Abner Greenspan, [facility]; she can be contacted at [phone]; will route to office for final disposition.  Reason for Disposition . Looking at light causes severe pain (i.e., photophobia)  Answer Assessment - Initial Assessment Questions 1. ONSET: "When did the pain start?" (e.g., minutes, hours, days)     07/20/19 2. TIMING: "Does the pain come and go, or has it been constant since it started?" (e.g., constant, intermittent, fleeting)    constant 3. SEVERITY: "How bad is the pain?"   (Scale 1-10; mild, moderate or severe)   - MILD (1-3): doesn't interfere with normal activities    - MODERATE (4-7): interferes with normal activities or awakens from sleep    - SEVERE (8-10): excruciating pain and patient unable to do normal activities     6 out of 10 but worse when she looks at the light 4. LOCATION: "Where does it hurt?"  (e.g., eyelid, eye, cheekbone)     eye 5. CAUSE: "What do you think is causing the pain?"    Not sure 6. VISION: "Do you have blurred vision or changes in your vision?"     no 7. EYE DISCHARGE: "Is there any discharge (pus) from the eye(s)?"  If yes, ask: "What color is it?"      Watering clear drainage 8. FEVER: "Do you have a fever?" If so, ask: "What is it, how was it measured, and when did it start?"      no 9. OTHER SYMPTOMS: "Do you have any other symptoms?" (e.g., headache, nasal discharge, facial rash)     Sensitivity to light, runny nose (thinks may be due to watery eyes) 10. PREGNANCY: "Is there any chance you are  pregnant?" "When was your last menstrual period?"       No, LMP 07/12/19  Protocols used: EYE PAIN-A-AH

## 2019-07-22 NOTE — Telephone Encounter (Signed)
Pt was called and she went to the urgent care to get eye checked. She does have pink eye. Apologies were given to patient for not calling back, advised to use warm compresses for the pain and what she would normally take OTC for pain. Patient was offered a VV, she declined.

## 2019-07-31 ENCOUNTER — Other Ambulatory Visit: Payer: Self-pay

## 2019-08-03 ENCOUNTER — Other Ambulatory Visit: Payer: Self-pay

## 2019-08-03 ENCOUNTER — Encounter: Payer: Self-pay | Admitting: Family Medicine

## 2019-08-03 ENCOUNTER — Ambulatory Visit: Payer: BC Managed Care – PPO | Admitting: Family Medicine

## 2019-08-03 VITALS — BP 102/72 | HR 75 | Temp 98.2°F | Resp 18 | Wt 143.0 lb

## 2019-08-03 DIAGNOSIS — Z87448 Personal history of other diseases of urinary system: Secondary | ICD-10-CM | POA: Diagnosis not present

## 2019-08-03 DIAGNOSIS — N390 Urinary tract infection, site not specified: Secondary | ICD-10-CM | POA: Diagnosis not present

## 2019-08-03 DIAGNOSIS — Z0000 Encounter for general adult medical examination without abnormal findings: Secondary | ICD-10-CM | POA: Diagnosis not present

## 2019-08-03 LAB — COMPREHENSIVE METABOLIC PANEL
ALT: 9 U/L (ref 0–35)
AST: 13 U/L (ref 0–37)
Albumin: 4 g/dL (ref 3.5–5.2)
Alkaline Phosphatase: 46 U/L (ref 39–117)
BUN: 8 mg/dL (ref 6–23)
CO2: 27 mEq/L (ref 19–32)
Calcium: 9.2 mg/dL (ref 8.4–10.5)
Chloride: 104 mEq/L (ref 96–112)
Creatinine, Ser: 0.82 mg/dL (ref 0.40–1.20)
GFR: 74.4 mL/min (ref >60.00)
Glucose, Bld: 80 mg/dL (ref 70–99)
Potassium: 3.8 mEq/L (ref 3.5–5.1)
Sodium: 138 mEq/L (ref 135–145)
Total Bilirubin: 0.8 mg/dL (ref 0.2–1.2)
Total Protein: 6.8 g/dL (ref 6.0–8.3)

## 2019-08-03 LAB — CBC WITH DIFFERENTIAL/PLATELET
Basophils Absolute: 0 10*3/uL (ref 0.0–0.1)
Basophils Relative: 0.5 % (ref 0.0–3.0)
Eosinophils Absolute: 0.1 10*3/uL (ref 0.0–0.7)
Eosinophils Relative: 1.6 % (ref 0.0–5.0)
HCT: 37.8 % (ref 36.0–46.0)
Hemoglobin: 12.8 g/dL (ref 12.0–15.0)
Lymphocytes Relative: 19.1 % (ref 12.0–46.0)
Lymphs Abs: 1.1 10*3/uL (ref 0.7–4.0)
MCHC: 33.9 g/dL (ref 30.0–36.0)
MCV: 91 fl (ref 78.0–100.0)
Monocytes Absolute: 0.3 10*3/uL (ref 0.1–1.0)
Monocytes Relative: 5.7 % (ref 3.0–12.0)
Neutro Abs: 4.2 10*3/uL (ref 1.4–7.7)
Neutrophils Relative %: 73.1 % (ref 43.0–77.0)
Platelets: 129 10*3/uL — ABNORMAL LOW (ref 150.0–400.0)
RBC: 4.15 Mil/uL (ref 3.87–5.11)
RDW: 12.4 % (ref 11.5–15.5)
WBC: 5.8 10*3/uL (ref 4.0–10.5)

## 2019-08-03 LAB — LIPID PANEL
Cholesterol: 160 mg/dL (ref 0–200)
HDL: 75.4 mg/dL
LDL Cholesterol: 78 mg/dL (ref 0–99)
NonHDL: 84.74
Total CHOL/HDL Ratio: 2
Triglycerides: 32 mg/dL (ref 0.0–149.0)
VLDL: 6.4 mg/dL (ref 0.0–40.0)

## 2019-08-03 LAB — URINALYSIS
Bilirubin Urine: NEGATIVE
Hgb urine dipstick: NEGATIVE
Ketones, ur: NEGATIVE
Leukocytes,Ua: NEGATIVE
Nitrite: NEGATIVE
Specific Gravity, Urine: 1.01 (ref 1.000–1.030)
Total Protein, Urine: NEGATIVE
Urine Glucose: NEGATIVE
Urobilinogen, UA: 0.2 (ref 0.0–1.0)
pH: 7 (ref 5.0–8.0)

## 2019-08-03 LAB — TSH: TSH: 1.2 u[IU]/mL (ref 0.35–4.50)

## 2019-08-03 NOTE — Progress Notes (Signed)
Subjective:    Patient ID: Gail Sanchez, female    DOB: 1971-02-03, 49 y.o.   MRN: [id number]  Chief Complaint  Patient presents with  . Follow-up    Urinary frequency    HPI Patient is in today for evaluation of recent urinary tract infection, she feels much better now after a course of Macrobid. She was having urinary frequency and urgency but no dysuria or hematuria. Noted some low back pain to left side which is also better. No fevers or chills. No other recent acute illness or hospitalizations. Denies CP/palp/SOB/HA/congestion/fevers/GI c/o. Taking meds as prescribed  Past Medical History:  Diagnosis Date  . Allergic state 06/09/2012  . Anxiety 06/25/2017  . Chicken pox as a child  . Fatigue 09/28/2011  . Low back pain 05/28/2015  . Onychomycosis 05/27/2016  . Preventative health care 06/09/2012  . 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 Vaginitis 12/27/2011    Past Surgical History:  Procedure Laterality Date  . cervix frozen      Family History  Problem Relation Age of Onset  . Cancer Mother   . Eczema Mother   . Hypertension Father   . Asthma Brother   . Allergic rhinitis Neg Hx   . Angioedema Neg Hx   . Immunodeficiency Neg Hx   . Urticaria Neg Hx     Social History   Socioeconomic History  . Marital status: Married    Spouse name: Not on file  . Number of children: Not on file  . Years of education: Not on file  . Highest education level: Not on file  Occupational History  . Occupation: Works with school   Tobacco Use  . Smoking status: Never Smoker  . Smokeless tobacco: Never Used  Substance and Sexual Activity  . Alcohol use: Yes    Comment: occ  . Drug use: No  . Sexual activity: Yes    Birth control/protection: Other-see comments    Comment: husband with vasectomy  Other Topics Concern  . Not on file  Social History Narrative  . Not on file   Social Determinants of Health   Financial Resource Strain:     . Difficulty of Paying Living Expenses: Not on file  Food Insecurity:   . Worried About Charity fundraiser in the Last Year: Not on file  . Ran Out of Food in the Last Year: Not on file  Transportation Needs:   . Lack of Transportation (Medical): Not on file  . Lack of Transportation (Non-Medical): Not on file  Physical Activity:   . Days of Exercise per Week: Not on file  . Minutes of Exercise per Session: Not on file  Stress:   . Feeling of Stress : Not on file  Social Connections:   . Frequency of Communication with Friends and Family: Not on file  . Frequency of Social Gatherings with Friends and Family: Not on file  . Attends Religious Services: Not on file  . Active Member of Clubs or Organizations: Not on file  . Attends Archivist Meetings: Not on file  . Marital Status: Not on file  Intimate Partner Violence:   . Fear of Current or Ex-Partner: Not on file  . Emotionally Abused: Not on file  . Physically Abused: Not on file  . Sexually Abused: Not on file    Outpatient Medications Prior to Visit  Medication Sig Dispense Refill  . ALPRAZolam (XANAX) 0.25 MG tablet Take  1 tablet (0.25 mg total) by mouth 2 (two) times daily as needed for anxiety. 20 tablet 2  . desloratadine (CLARINEX) 5 MG tablet Take 1 tablet (5 mg total) by mouth as needed. 90 tablet 1  . fluticasone (FLONASE) 50 MCG/ACT nasal spray USE 2 SPRAYS INTO BOTH NOSTRILS DAILY 16 g 5  . methocarbamol (ROBAXIN) 500 MG tablet Take 1 tablet (500 mg total) by mouth every 6 (six) hours as needed for muscle spasms. 30 tablet 0  . Multiple Vitamin (MULTIVITAMIN) tablet Take 1 tablet by mouth daily.    . nitrofurantoin, macrocrystal-monohydrate, (MACROBID) 100 MG capsule Take 1 capsule (100 mg total) by mouth 2 (two) times daily. 10 capsule 0   No facility-administered medications prior to visit.    No Known Allergies  Review of Systems  Constitutional: Positive for malaise/fatigue. Negative for fever.   HENT: Negative for congestion.   Eyes: Negative for blurred vision.  Respiratory: Negative for shortness of breath.   Cardiovascular: Negative for chest pain, palpitations and leg swelling.  Gastrointestinal: Negative for abdominal pain, blood in stool and nausea.  Genitourinary: Positive for frequency and urgency. Negative for dysuria and hematuria.  Musculoskeletal: Positive for back pain. Negative for falls and myalgias.  Skin: Negative for rash.  Neurological: Negative for dizziness, loss of consciousness and headaches.  Endo/Heme/Allergies: Negative for environmental allergies.  Psychiatric/Behavioral: Negative for depression. The patient is not nervous/anxious.        Objective:    Physical Exam Vitals and nursing note reviewed.  Constitutional:      General: She is not in acute distress.    Appearance: She is well-developed.  HENT:     Head: Normocephalic and atraumatic.     Nose: Nose normal.  Eyes:     General:        Right eye: No discharge.        Left eye: No discharge.  Cardiovascular:     Rate and Rhythm: Normal rate and regular rhythm.     Heart sounds: No murmur.  Pulmonary:     Effort: Pulmonary effort is normal.     Breath sounds: Normal breath sounds.  Abdominal:     General: Bowel sounds are normal.     Palpations: Abdomen is soft.     Tenderness: There is no abdominal tenderness.  Musculoskeletal:     Cervical back: Normal range of motion and neck supple.  Skin:    General: Skin is warm and dry.  Neurological:     Mental Status: She is alert and oriented to person, place, and time.     BP 102/72 (BP Location: Left Arm, Patient Position: Sitting, Cuff Size: Normal)   Pulse 75   Temp 98.2 F (36.8 C) (Temporal)   Resp 18   Wt 143 lb (64.9 kg)   SpO2 98%   BMI 21.12 kg/m  Wt Readings from Last 3 Encounters:  08/03/19 143 lb (64.9 kg)  08/04/18 141 lb (64 kg)  05/06/18 142 lb (64.4 kg)    Diabetic Foot Exam - Simple   No data filed      Lab Results  Component Value Date   WBC 4.7 08/04/2018   HGB 12.9 08/04/2018   HCT 38.3 08/04/2018   PLT 149.0 (L) 08/04/2018   GLUCOSE 83 08/04/2018   CHOL 156 08/04/2018   TRIG 42.0 08/04/2018   HDL 74.30 08/04/2018   LDLCALC 73 08/04/2018   ALT 10 08/04/2018   AST 13 08/04/2018   NA 141 08/04/2018  K 3.8 08/04/2018   CL 103 08/04/2018   CREATININE 0.89 08/04/2018   BUN 10 08/04/2018   CO2 30 08/04/2018   TSH 1.74 08/04/2018    Lab Results  Component Value Date   TSH 1.74 08/04/2018   Lab Results  Component Value Date   WBC 4.7 08/04/2018   HGB 12.9 08/04/2018   HCT 38.3 08/04/2018   MCV 90.1 08/04/2018   PLT 149.0 (L) 08/04/2018   Lab Results  Component Value Date   NA 141 08/04/2018   K 3.8 08/04/2018   CO2 30 08/04/2018   GLUCOSE 83 08/04/2018   BUN 10 08/04/2018   CREATININE 0.89 08/04/2018   BILITOT 0.7 08/04/2018   ALKPHOS 54 08/04/2018   AST 13 08/04/2018   ALT 10 08/04/2018   PROT 7.1 08/04/2018   ALBUMIN 4.3 08/04/2018   CALCIUM 9.7 08/04/2018   GFR 67.97 08/04/2018   Lab Results  Component Value Date   CHOL 156 08/04/2018   Lab Results  Component Value Date   HDL 74.30 08/04/2018   Lab Results  Component Value Date   LDLCALC 73 08/04/2018   Lab Results  Component Value Date   TRIG 42.0 08/04/2018   Lab Results  Component Value Date   CHOLHDL 2 08/04/2018   No results found for: HGBA1C     Assessment & Plan:   Problem List Items Addressed This Visit    Personal history of urinary disorder    Recently treated with Macrobid for UTI and feeling much better, did not have any dysuria but did have urgency and frequency and these have improved recheck culture and UA and she will hydrate and use cranberry tabs      Preventative health care    She has her annual appointment next week, will order lab work today for review at her appointment.       Relevant Orders   Lipid panel   CBC with Differential/Platelet   TSH     Other Visit Diagnoses    Urinary tract infection without hematuria, site unspecified    -  Primary   Relevant Orders   Comprehensive metabolic panel   Urinalysis   Urine Culture      I am having Fallynn C. Odowd maintain her multivitamin, methocarbamol, ALPRAZolam, desloratadine, fluticasone, and nitrofurantoin (macrocrystal-monohydrate).  No orders of the defined types were placed in this encounter.    Danise Edge, MD

## 2019-08-03 NOTE — Assessment & Plan Note (Signed)
Recently treated with Macrobid for UTI and feeling much better, did not have any dysuria but did have urgency and frequency and these have improved recheck culture and UA and she will hydrate and use cranberry tabs

## 2019-08-03 NOTE — Patient Instructions (Addendum)
Pulse ox want oxygen in 90s  Omron Blood pressure cuff, upper arm   Weekly vitals   Multivitamin with minerals, selenium  Vitamin D 2000 IU daily Aspirin EC 81 mg daily Probiotic daily  Melatonin 2-5 mg at bedtime  COVID-19 COVID-19 is a respiratory infection that is caused by a virus called severe acute respiratory syndrome coronavirus 2 (SARS-CoV-2). The disease is also known as coronavirus disease or novel coronavirus. In some people, the virus may not cause any symptoms. In others, it may cause a serious infection. The infection can get worse quickly and can lead to complications, such as:  Pneumonia, or infection of the lungs.  Acute respiratory distress syndrome or ARDS. This is a condition in which fluid build-up in the lungs prevents the lungs from filling with air and passing oxygen into the blood.  Acute respiratory failure. This is a condition in which there is not enough oxygen passing from the lungs to the body or when carbon dioxide is not passing from the lungs out of the body.  Sepsis or septic shock. This is a serious bodily reaction to an infection.  Blood clotting problems.  Secondary infections due to bacteria or fungus.  Organ failure. This is when your body's organs stop working. The virus that causes COVID-19 is contagious. This means that it can spread from person to person through droplets from coughs and sneezes (respiratory secretions). What are the causes? This illness is caused by a virus. You may catch the virus by:  Breathing in droplets from an infected person. Droplets can be spread by a person breathing, speaking, singing, coughing, or sneezing.  Touching something, like a table or a doorknob, that was exposed to the virus (contaminated) and then touching your mouth, nose, or eyes. What increases the risk? Risk for infection You are more likely to be infected with this virus if you:  Are within 6 feet (2 meters) of a person with  COVID-19.  Provide care for or live with a person who is infected with COVID-19.  Spend time in crowded indoor spaces or live in shared housing. Risk for serious illness You are more likely to become seriously ill from the virus if you:  Are 48 years of age or older. The higher your age, the more you are at risk for serious illness.  Live in a nursing home or long-term care facility.  Have cancer.  Have a long-term (chronic) disease such as: ? Chronic lung disease, including chronic obstructive pulmonary disease or asthma. ? A long-term disease that lowers your body's ability to fight infection (immunocompromised). ? Heart disease, including heart failure, a condition in which the arteries that lead to the heart become narrow or blocked (coronary artery disease), a disease which makes the heart muscle thick, weak, or stiff (cardiomyopathy). ? Diabetes. ? Chronic kidney disease. ? Sickle cell disease, a condition in which red blood cells have an abnormal "sickle" shape. ? Liver disease.  Are obese. What are the signs or symptoms? Symptoms of this condition can range from mild to severe. Symptoms may appear any time from 2 to 14 days after being exposed to the virus. They include:  A fever or chills.  A cough.  Difficulty breathing.  Headaches, body aches, or muscle aches.  Runny or stuffy (congested) nose.  A sore throat.  New loss of taste or smell. Some people may also have stomach problems, such as nausea, vomiting, or diarrhea. Other people may not have any symptoms of COVID-19. How  is this diagnosed? This condition may be diagnosed based on:  Your signs and symptoms, especially if: ? You live in an area with a COVID-19 outbreak. ? You recently traveled to or from an area where the virus is common. ? You provide care for or live with a person who was diagnosed with COVID-19. ? You were exposed to a person who was diagnosed with COVID-19.  A physical exam.  Lab  tests, which may include: ? Taking a sample of fluid from the back of your nose and throat (nasopharyngeal fluid), your nose, or your throat using a swab. ? A sample of mucus from your lungs (sputum). ? Blood tests.  Imaging tests, which may include, X-rays, CT scan, or ultrasound. How is this treated? At present, there is no medicine to treat COVID-19. Medicines that treat other diseases are being used on a trial basis to see if they are effective against COVID-19. Your health care provider will talk with you about ways to treat your symptoms. For most people, the infection is mild and can be managed at home with rest, fluids, and over-the-counter medicines. Treatment for a serious infection usually takes places in a hospital intensive care unit (ICU). It may include one or more of the following treatments. These treatments are given until your symptoms improve.  Receiving fluids and medicines through an IV.  Supplemental oxygen. Extra oxygen is given through a tube in the nose, a face mask, or a hood.  Positioning you to lie on your stomach (prone position). This makes it easier for oxygen to get into the lungs.  Continuous positive airway pressure (CPAP) or bi-level positive airway pressure (BPAP) machine. This treatment uses mild air pressure to keep the airways open. A tube that is connected to a motor delivers oxygen to the body.  Ventilator. This treatment moves air into and out of the lungs by using a tube that is placed in your windpipe.  Tracheostomy. This is a procedure to create a hole in the neck so that a breathing tube can be inserted.  Extracorporeal membrane oxygenation (ECMO). This procedure gives the lungs a chance to recover by taking over the functions of the heart and lungs. It supplies oxygen to the body and removes carbon dioxide. Follow these instructions at home: Lifestyle  If you are sick, stay home except to get medical care. Your health care provider will tell  you how long to stay home. Call your health care provider before you go for medical care.  Rest at home as told by your health care provider.  Do not use any products that contain nicotine or tobacco, such as cigarettes, e-cigarettes, and chewing tobacco. If you need help quitting, ask your health care provider.  Return to your normal activities as told by your health care provider. Ask your health care provider what activities are safe for you. General instructions  Take over-the-counter and prescription medicines only as told by your health care provider.  Drink enough fluid to keep your urine pale yellow.  Keep all follow-up visits as told by your health care provider. This is important. How is this prevented?  There is no vaccine to help prevent COVID-19 infection. However, there are steps you can take to protect yourself and others from this virus. To protect yourself:   Do not travel to areas where COVID-19 is a risk. The areas where COVID-19 is reported change often. To identify high-risk areas and travel restrictions, check the CDC travel website: StageSync.si  If  you live in, or must travel to, an area where COVID-19 is a risk, take precautions to avoid infection. ? Stay away from people who are sick. ? Wash your hands often with soap and water for 20 seconds. If soap and water are not available, use an alcohol-based hand sanitizer. ? Avoid touching your mouth, face, eyes, or nose. ? Avoid going out in public, follow guidance from your state and local health authorities. ? If you must go out in public, wear a cloth face covering or face mask. Make sure your mask covers your nose and mouth. ? Avoid crowded indoor spaces. Stay at least 6 feet (2 meters) away from others. ? Disinfect objects and surfaces that are frequently touched every day. This may include:  Counters and tables.  Doorknobs and light switches.  Sinks and faucets.  Electronics, such as  phones, remote controls, keyboards, computers, and tablets. To protect others: If you have symptoms of COVID-19, take steps to prevent the virus from spreading to others.  If you think you have a COVID-19 infection, contact your health care provider right away. Tell your health care team that you think you may have a COVID-19 infection.  Stay home. Leave your house only to seek medical care. Do not use public transport.  Do not travel while you are sick.  Wash your hands often with soap and water for 20 seconds. If soap and water are not available, use alcohol-based hand sanitizer.  Stay away from other members of your household. Let healthy household members care for children and pets, if possible. If you have to care for children or pets, wash your hands often and wear a mask. If possible, stay in your own room, separate from others. Use a different bathroom.  Make sure that all people in your household wash their hands well and often.  Cough or sneeze into a tissue or your sleeve or elbow. Do not cough or sneeze into your hand or into the air.  Wear a cloth face covering or face mask. Make sure your mask covers your nose and mouth. Where to find more information  Centers for Disease Control and Prevention: StickerEmporium.tn  World Health Organization: https://thompson-craig.com/ Contact a health care provider if:  You live in or have traveled to an area where COVID-19 is a risk and you have symptoms of the infection.  You have had contact with someone who has COVID-19 and you have symptoms of the infection. Get help right away if:  You have trouble breathing.  You have pain or pressure in your chest.  You have confusion.  You have bluish lips and fingernails.  You have difficulty waking from sleep.  You have symptoms that get worse. These symptoms may represent a serious problem that is an emergency. Do not wait to see if the symptoms  will go away. Get medical help right away. Call your local emergency services (911 in the [place] Do not drive yourself to the hospital. Let the emergency medical personnel know if you think you have COVID-19. Summary  COVID-19 is a respiratory infection that is caused by a virus. It is also known as coronavirus disease or novel coronavirus. It can cause serious infections, such as pneumonia, acute respiratory distress syndrome, acute respiratory failure, or sepsis.  The virus that causes COVID-19 is contagious. This means that it can spread from person to person through droplets from breathing, speaking, singing, coughing, or sneezing.  You are more likely to develop a serious illness if you  are 59 years of age or older, have a weak immune system, live in a nursing home, or have chronic disease.  There is no medicine to treat COVID-19. Your health care provider will talk with you about ways to treat your symptoms.  Take steps to protect yourself and others from infection. Wash your hands often and disinfect objects and surfaces that are frequently touched every day. Stay away from people who are sick and wear a mask if you are sick. This information is not intended to replace advice given to you by your health care provider. Make sure you discuss any questions you have with your health care provider. Document Revised: 04/24/2019 Document Reviewed: 07/31/2018 Elsevier Patient Education  [place].

## 2019-08-03 NOTE — Assessment & Plan Note (Signed)
She has her annual appointment next week, will order lab work today for review at her appointment.

## 2019-08-04 ENCOUNTER — Encounter: Payer: Self-pay | Admitting: Family Medicine

## 2019-08-04 LAB — URINE CULTURE
MICRO NUMBER:: 10076308
Result:: NO GROWTH
SPECIMEN QUALITY:: ADEQUATE

## 2019-08-13 ENCOUNTER — Ambulatory Visit (INDEPENDENT_AMBULATORY_CARE_PROVIDER_SITE_OTHER): Payer: BC Managed Care – PPO | Admitting: Family Medicine

## 2019-08-13 ENCOUNTER — Encounter: Payer: BC Managed Care – PPO | Admitting: Family Medicine

## 2019-08-13 ENCOUNTER — Other Ambulatory Visit: Payer: Self-pay

## 2019-08-13 DIAGNOSIS — Z0000 Encounter for general adult medical examination without abnormal findings: Secondary | ICD-10-CM | POA: Diagnosis not present

## 2019-08-13 DIAGNOSIS — D696 Thrombocytopenia, unspecified: Secondary | ICD-10-CM | POA: Insufficient documentation

## 2019-08-13 NOTE — Progress Notes (Signed)
Virtual Visit via Video Note  I connected with Gail Sanchez on 08/13/19 at  8:20 AM EST by a video enabled telemedicine application and verified that I am speaking with the correct person using two identifiers.  Location: Patient: home Provider: office   I discussed the limitations of evaluation and managemenomeht by telemedicine and the availability of in person appointments. The patient expressed understanding and agreed to proceed. Magdalene Molly, CMA was able to get the patient set up on a video visit.     Subjective:    Patient ID: Gail Sanchez, female    DOB: 06-14-1971, 49 y.o.   MRN: [id number]  No chief complaint on file.   HPI Patient is in today for annual preventative exam. She feels well today. No recent febrile illness or hospitalizations. She is home helping her kids with schooling. They are maintaining quarantine well. She continues to exercise regularly and eat a heat healthy diet. Denies CP/palp/SOB/HA/congestion/fevers/GI or GU c/o. Taking meds as prescribed  Past Medical History:  Diagnosis Date  . Allergic state 06/09/2012  . Anxiety 06/25/2017  . Chicken pox as a child  . Fatigue 09/28/2011  . Low back pain 05/28/2015  . Onychomycosis 05/27/2016  . Preventative health care 06/09/2012  . 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 Vaginitis 12/27/2011    Past Surgical History:  Procedure Laterality Date  . cervix frozen      Family History  Problem Relation Age of Onset  . Cancer Mother   . Eczema Mother   . Hypertension Father   . Asthma Brother   . Allergic rhinitis Neg Hx   . Angioedema Neg Hx   . Immunodeficiency Neg Hx   . Urticaria Neg Hx     Social History   Socioeconomic History  . Marital status: Married    Spouse name: Not on file  . Number of children: Not on file  . Years of education: Not on file  . Highest education level: Not on file  Occupational History  . Occupation: Works with school     Tobacco Use  . Smoking status: Never Smoker  . Smokeless tobacco: Never Used  Substance and Sexual Activity  . Alcohol use: Yes    Comment: occ  . Drug use: No  . Sexual activity: Yes    Birth control/protection: Other-see comments    Comment: husband with vasectomy  Other Topics Concern  . Not on file  Social History Narrative  . Not on file   Social Determinants of Health   Financial Resource Strain:   . Difficulty of Paying Living Expenses: Not on file  Food Insecurity:   . Worried About Charity fundraiser in the Last Year: Not on file  . Ran Out of Food in the Last Year: Not on file  Transportation Needs:   . Lack of Transportation (Medical): Not on file  . Lack of Transportation (Non-Medical): Not on file  Physical Activity:   . Days of Exercise per Week: Not on file  . Minutes of Exercise per Session: Not on file  Stress:   . Feeling of Stress : Not on file  Social Connections:   . Frequency of Communication with Friends and Family: Not on file  . Frequency of Social Gatherings with Friends and Family: Not on file  . Attends Religious Services: Not on file  . Active Member of Clubs or Organizations: Not on file  . Attends Archivist Meetings:  Not on file  . Marital Status: Not on file  Intimate Partner Violence:   . Fear of Current or Ex-Partner: Not on file  . Emotionally Abused: Not on file  . Physically Abused: Not on file  . Sexually Abused: Not on file    Outpatient Medications Prior to Visit  Medication Sig Dispense Refill  . ALPRAZolam (XANAX) 0.25 MG tablet Take 1 tablet (0.25 mg total) by mouth 2 (two) times daily as needed for anxiety. 20 tablet 2  . desloratadine (CLARINEX) 5 MG tablet Take 1 tablet (5 mg total) by mouth as needed. 90 tablet 1  . fluticasone (FLONASE) 50 MCG/ACT nasal spray USE 2 SPRAYS INTO BOTH NOSTRILS DAILY 16 g 5  . methocarbamol (ROBAXIN) 500 MG tablet Take 1 tablet (500 mg total) by mouth every 6 (six) hours as  needed for muscle spasms. 30 tablet 0  . Multiple Vitamin (MULTIVITAMIN) tablet Take 1 tablet by mouth daily.    . nitrofurantoin, macrocrystal-monohydrate, (MACROBID) 100 MG capsule Take 1 capsule (100 mg total) by mouth 2 (two) times daily. 10 capsule 0   No facility-administered medications prior to visit.    No Known Allergies  Review of Systems  Constitutional: Negative for chills, fever and malaise/fatigue.  HENT: Negative for congestion and hearing loss.   Eyes: Negative for discharge.  Respiratory: Negative for cough, sputum production and shortness of breath.   Cardiovascular: Negative for chest pain, palpitations and leg swelling.  Gastrointestinal: Negative for abdominal pain, blood in stool, constipation, diarrhea, heartburn, nausea and vomiting.  Genitourinary: Negative for dysuria, frequency, hematuria and urgency.  Musculoskeletal: Negative for back pain, falls and myalgias.  Skin: Negative for rash.  Neurological: Negative for dizziness, sensory change, loss of consciousness, weakness and headaches.  Endo/Heme/Allergies: Negative for environmental allergies. Does not bruise/bleed easily.  Psychiatric/Behavioral: Negative for depression and suicidal ideas. The patient is not nervous/anxious and does not have insomnia.        Objective:    Physical Exam Constitutional:      Appearance: Normal appearance. She is not ill-appearing.  HENT:     Head: Normocephalic and atraumatic.     Right Ear: External ear normal.     Left Ear: External ear normal.     Nose: Nose normal.  Eyes:     General:        Right eye: No discharge.        Left eye: No discharge.  Pulmonary:     Effort: Pulmonary effort is normal.  Neurological:     Mental Status: She is alert and oriented to person, place, and time.  Psychiatric:        Behavior: Behavior normal.     There were no vitals taken for this visit. Wt Readings from Last 3 Encounters:  08/03/19 143 lb (64.9 kg)  08/04/18  141 lb (64 kg)  05/06/18 142 lb (64.4 kg)    Diabetic Foot Exam - Simple   No data filed     Lab Results  Component Value Date   WBC 5.8 08/03/2019   HGB 12.8 08/03/2019   HCT 37.8 08/03/2019   PLT 129.0 (L) 08/03/2019   GLUCOSE 80 08/03/2019   CHOL 160 08/03/2019   TRIG 32.0 08/03/2019   HDL 75.40 08/03/2019   LDLCALC 78 08/03/2019   ALT 9 08/03/2019   AST 13 08/03/2019   NA 138 08/03/2019   K 3.8 08/03/2019   CL 104 08/03/2019   CREATININE 0.82 08/03/2019   BUN 8  08/03/2019   CO2 27 08/03/2019   TSH 1.20 08/03/2019    Lab Results  Component Value Date   TSH 1.20 08/03/2019   Lab Results  Component Value Date   WBC 5.8 08/03/2019   HGB 12.8 08/03/2019   HCT 37.8 08/03/2019   MCV 91.0 08/03/2019   PLT 129.0 (L) 08/03/2019   Lab Results  Component Value Date   NA 138 08/03/2019   K 3.8 08/03/2019   CO2 27 08/03/2019   GLUCOSE 80 08/03/2019   BUN 8 08/03/2019   CREATININE 0.82 08/03/2019   BILITOT 0.8 08/03/2019   ALKPHOS 46 08/03/2019   AST 13 08/03/2019   ALT 9 08/03/2019   PROT 6.8 08/03/2019   ALBUMIN 4.0 08/03/2019   CALCIUM 9.2 08/03/2019   GFR 74.40 08/03/2019   Lab Results  Component Value Date   CHOL 160 08/03/2019   Lab Results  Component Value Date   HDL 75.40 08/03/2019   Lab Results  Component Value Date   LDLCALC 78 08/03/2019   Lab Results  Component Value Date   TRIG 32.0 08/03/2019   Lab Results  Component Value Date   CHOLHDL 2 08/03/2019   No results found for: HGBA1C     Assessment & Plan:   Problem List Items Addressed This Visit    Preventative health care    Patient encouraged to maintain heart healthy diet, regular exercise, adequate sleep. Consider daily probiotics. Take medications as prescribed. Labs reviewed with patient.       Thrombocytopenia (HCC)    Mild and asymptomatic. Will repeat cbc in 3-6 months and patient warned regarding symptoms if platelets were to drop quickly. She reports she had a  similar episode during a pregnancy in the past but normalized spontaneously         I have discontinued Rylyn C. Menges's nitrofurantoin (macrocrystal-monohydrate). I am also having her maintain her multivitamin, methocarbamol, ALPRAZolam, desloratadine, and fluticasone.  No orders of the defined types were placed in this encounter.   I discussed the assessment and treatment plan with the patient. The patient was provided an opportunity to ask questions and all were answered. The patient agreed with the plan and demonstrated an understanding of the instructions.   The patient was advised to call back or seek an in-person evaluation if the symptoms worsen or if the condition fails to improve as anticipated.  I provided 30 minutes of non-face-to-face time during this encounter.   Danise Edge, MD

## 2019-08-13 NOTE — Assessment & Plan Note (Signed)
Mild and asymptomatic. Will repeat cbc in 3-6 months and patient warned regarding symptoms if platelets were to drop quickly. She reports she had a similar episode during a pregnancy in the past but normalized spontaneously

## 2019-08-13 NOTE — Assessment & Plan Note (Signed)
Patient encouraged to maintain heart healthy diet, regular exercise, adequate sleep. Consider daily probiotics. Take medications as prescribed. Labs reviewed with patient.  

## 2019-08-13 NOTE — Patient Instructions (Signed)
[employer] Blood Pressure cuff, upper arm, want BP 100-140/60-90 Pulse oximeter, want oxygen in 90s  Weekly vitals  Take Multivitamin with minerals, selenium Vitamin D 1000-2000 IU daily Probiotic with lactobacillus and bifidophilus Asprin EC 81 mg daily  Melatonin 2-5 mg at bedtime  [email or web address]  [employer] company at [email or web address]

## 2019-09-02 ENCOUNTER — Other Ambulatory Visit (HOSPITAL_BASED_OUTPATIENT_CLINIC_OR_DEPARTMENT_OTHER): Payer: Self-pay | Admitting: Family Medicine

## 2019-09-02 DIAGNOSIS — Z1231 Encounter for screening mammogram for malignant neoplasm of breast: Secondary | ICD-10-CM

## 2019-09-04 ENCOUNTER — Other Ambulatory Visit: Payer: Self-pay

## 2019-09-04 ENCOUNTER — Ambulatory Visit (HOSPITAL_BASED_OUTPATIENT_CLINIC_OR_DEPARTMENT_OTHER)
Admission: RE | Admit: 2019-09-04 | Discharge: 2019-09-04 | Disposition: A | Discharge status: Home / Self Care | Payer: BC Managed Care – PPO | Source: Ambulatory Visit | Attending: Family Medicine | Admitting: Family Medicine

## 2019-09-04 DIAGNOSIS — Z1231 Encounter for screening mammogram for malignant neoplasm of breast: Secondary | ICD-10-CM | POA: Diagnosis not present

## 2019-09-07 ENCOUNTER — Other Ambulatory Visit: Payer: Self-pay | Admitting: Family Medicine

## 2019-09-07 DIAGNOSIS — R928 Other abnormal and inconclusive findings on diagnostic imaging of breast: Secondary | ICD-10-CM

## 2019-09-13 ENCOUNTER — Ambulatory Visit: Payer: BC Managed Care – PPO | Attending: Internal Medicine

## 2019-09-13 DIAGNOSIS — Z23 Encounter for immunization: Secondary | ICD-10-CM | POA: Insufficient documentation

## 2019-09-13 NOTE — Progress Notes (Signed)
   Covid-19 Vaccination Clinic  Name:  Gail Sanchez    MRN: [id number] DOB: [birth year]  09/13/2019  Gail Sanchez was observed post Covid-19 immunization for 15 minutes without incident. She was provided with Vaccine Information Sheet and instruction to access the V-Safe system.   Gail Sanchez was instructed to call 911 with any severe reactions post vaccine: Marland Kitchen Difficulty breathing  . Swelling of face and throat  . A fast heartbeat  . A bad rash all over body  . Dizziness and weakness   Immunizations Administered    Name Date Dose VIS Date Route   Pfizer COVID-19 Vaccine 09/13/2019  9:19 AM 0.3 mL 06/19/2019 Intramuscular   Manufacturer: [employer], [employer]   Lot: [id number]   NDC: [address]-1

## 2019-09-17 ENCOUNTER — Ambulatory Visit: Payer: BC Managed Care – PPO

## 2019-09-17 ENCOUNTER — Ambulatory Visit
Admission: RE | Admit: 2019-09-17 | Discharge: 2019-09-17 | Disposition: A | Discharge status: Home / Self Care | Payer: BC Managed Care – PPO | Source: Ambulatory Visit | Attending: Family Medicine | Admitting: Family Medicine

## 2019-09-17 ENCOUNTER — Other Ambulatory Visit: Payer: Self-pay

## 2019-09-17 DIAGNOSIS — R928 Other abnormal and inconclusive findings on diagnostic imaging of breast: Secondary | ICD-10-CM

## 2019-10-04 ENCOUNTER — Ambulatory Visit: Payer: BC Managed Care – PPO | Attending: Internal Medicine

## 2019-10-04 DIAGNOSIS — Z23 Encounter for immunization: Secondary | ICD-10-CM

## 2019-10-04 NOTE — Progress Notes (Signed)
   Covid-19 Vaccination Clinic  Name:  Gail Sanchez    MRN: [id number] DOB: [birth year]  10/04/2019  Gail Sanchez was observed post Covid-19 immunization for 15 minutes without incident. She was provided with Vaccine Information Sheet and instruction to access the V-Safe system.   Gail Sanchez was instructed to call 911 with any severe reactions post vaccine: Marland Kitchen Difficulty breathing  . Swelling of face and throat  . A fast heartbeat  . A bad rash all over body  . Dizziness and weakness   Immunizations Administered    Name Date Dose VIS Date Route   Pfizer COVID-19 Vaccine 10/04/2019  9:03 AM 0.3 mL 06/19/2019 Intramuscular   Manufacturer: [employer], [employer]   Lot: [id number]   NDC: [address]-9

## 2019-11-27 ENCOUNTER — Other Ambulatory Visit: Payer: Self-pay | Admitting: Family Medicine

## 2020-02-24 ENCOUNTER — Encounter: Payer: Self-pay | Admitting: Family Medicine

## 2020-02-26 ENCOUNTER — Ambulatory Visit: Payer: BC Managed Care – PPO | Admitting: Family Medicine

## 2020-02-26 ENCOUNTER — Encounter: Payer: Self-pay | Admitting: Family Medicine

## 2020-02-26 ENCOUNTER — Other Ambulatory Visit: Payer: Self-pay

## 2020-02-26 VITALS — BP 118/70 | HR 80 | Temp 98.1°F | Resp 18 | Ht 69.0 in | Wt 142.8 lb

## 2020-02-26 DIAGNOSIS — R8290 Unspecified abnormal findings in urine: Secondary | ICD-10-CM | POA: Diagnosis not present

## 2020-02-26 DIAGNOSIS — R5383 Other fatigue: Secondary | ICD-10-CM

## 2020-02-26 DIAGNOSIS — M545 Low back pain, unspecified: Secondary | ICD-10-CM

## 2020-02-26 LAB — POC URINALSYSI DIPSTICK (AUTOMATED)
Bilirubin, UA: NEGATIVE
Glucose, UA: NEGATIVE
Ketones, UA: NEGATIVE
Leukocytes, UA: NEGATIVE
Nitrite, UA: NEGATIVE
Protein, UA: NEGATIVE
Spec Grav, UA: <=1.005 — AB (ref 1.010–1.025)
Urobilinogen, UA: 0.2 E.U./dL
pH, UA: 6 (ref 5.0–8.0)

## 2020-02-26 LAB — COMPREHENSIVE METABOLIC PANEL
ALT: 9 U/L (ref 0–35)
AST: 14 U/L (ref 0–37)
Albumin: 4.3 g/dL (ref 3.5–5.2)
Alkaline Phosphatase: 54 U/L (ref 39–117)
BUN: 8 mg/dL (ref 6–23)
CO2: 30 mEq/L (ref 19–32)
Calcium: 9.5 mg/dL (ref 8.4–10.5)
Chloride: 103 mEq/L (ref 96–112)
Creatinine, Ser: 0.93 mg/dL (ref 0.40–1.20)
GFR: 64.18 mL/min (ref >60.00)
Glucose, Bld: 92 mg/dL (ref 70–99)
Potassium: 3.8 mEq/L (ref 3.5–5.1)
Sodium: 140 mEq/L (ref 135–145)
Total Bilirubin: 0.5 mg/dL (ref 0.2–1.2)
Total Protein: 7.4 g/dL (ref 6.0–8.3)

## 2020-02-26 LAB — TSH: TSH: 1.03 u[IU]/mL (ref 0.35–4.50)

## 2020-02-26 LAB — CBC WITH DIFFERENTIAL/PLATELET
Basophils Absolute: 0 10*3/uL (ref 0.0–0.1)
Basophils Relative: 0.8 % (ref 0.0–3.0)
Eosinophils Absolute: 0.1 10*3/uL (ref 0.0–0.7)
Eosinophils Relative: 1.8 % (ref 0.0–5.0)
HCT: 37.7 % (ref 36.0–46.0)
Hemoglobin: 12.9 g/dL (ref 12.0–15.0)
Lymphocytes Relative: 24.7 % (ref 12.0–46.0)
Lymphs Abs: 1 10*3/uL (ref 0.7–4.0)
MCHC: 34.1 g/dL (ref 30.0–36.0)
MCV: 90.4 fl (ref 78.0–100.0)
Monocytes Absolute: 0.3 10*3/uL (ref 0.1–1.0)
Monocytes Relative: 6.6 % (ref 3.0–12.0)
Neutro Abs: 2.7 10*3/uL (ref 1.4–7.7)
Neutrophils Relative %: 66.1 % (ref 43.0–77.0)
Platelets: 135 10*3/uL — ABNORMAL LOW (ref 150.0–400.0)
RBC: 4.17 Mil/uL (ref 3.87–5.11)
RDW: 12.4 % (ref 11.5–15.5)
WBC: 4.1 10*3/uL (ref 4.0–10.5)

## 2020-02-26 LAB — VITAMIN D 25 HYDROXY (VIT D DEFICIENCY, FRACTURES): VITD: 50.4 ng/mL (ref 30.00–100.00)

## 2020-02-26 LAB — VITAMIN B12: Vitamin B-12: 276 pg/mL (ref 211–911)

## 2020-02-26 NOTE — Progress Notes (Signed)
Patient ID: Gail Sanchez, female    DOB: 1970/07/26  Age: 49 y.o. MRN: [id number]    Subjective:  Subjective  HPI Gail Sanchez presents for low back pain since May.  [facility]a and it was neg.  She then went to chiropractor , acupuncturist and and pt.  The acupuncturist gave her a chinese remedy because she said it was her kidneys and pain slowly went away---  It has completely resolved.   The supplement was cynomorium and achyranthes combo (look bien yuen), she also c/o fatigue but that is better too.   No other symptoms  Review of Systems  Constitutional: Negative for appetite change, diaphoresis, fatigue and unexpected weight change.  Eyes: Negative for pain, redness and visual disturbance.  Respiratory: Negative for cough, chest tightness, shortness of breath and wheezing.   Cardiovascular: Negative for chest pain, palpitations and leg swelling.  Endocrine: Negative for cold intolerance, heat intolerance, polydipsia, polyphagia and polyuria.  Genitourinary: Negative for difficulty urinating, dysuria and frequency.  Neurological: Negative for dizziness, light-headedness, numbness and headaches.    History Past Medical History:  Diagnosis Date  . Allergic state 06/09/2012  . Anxiety 06/25/2017  . Chicken pox as a child  . Fatigue 09/28/2011  . Low back pain 05/28/2015  . Onychomycosis 05/27/2016  . Preventative health care 06/09/2012  . 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 Vaginitis 12/27/2011    She has a past surgical history that includes cervix frozen.   Her family history includes Asthma in her brother; Cancer in her mother; Eczema in her mother; Hypertension in her father.She reports that she has never smoked. She has never used smokeless tobacco. She reports current alcohol use. She reports that she does not use drugs.  Current Outpatient Medications on File Prior to Visit  Medication Sig  Dispense Refill  . desloratadine (CLARINEX) 5 MG tablet Take 1 tablet (5 mg total) by mouth as needed. 90 tablet 1  . fluticasone (FLONASE) 50 MCG/ACT nasal spray USE 2 SPRAYS INTO BOTH NOSTRILS DAILY 48 mL 1  . methocarbamol (ROBAXIN) 500 MG tablet Take 1 tablet (500 mg total) by mouth every 6 (six) hours as needed for muscle spasms. 30 tablet 0  . Multiple Vitamin (MULTIVITAMIN) tablet Take 1 tablet by mouth daily.     No current facility-administered medications on file prior to visit.     Objective:  Objective  Physical Exam Vitals and nursing note reviewed.  Constitutional:      Appearance: She is well-developed.  HENT:     Head: Normocephalic and atraumatic.  Eyes:     Conjunctiva/sclera: Conjunctivae normal.  Neck:     Thyroid: No thyromegaly.     Vascular: No carotid bruit or JVD.  Cardiovascular:     Rate and Rhythm: Normal rate and regular rhythm.     Heart sounds: Normal heart sounds. No murmur heard.   Pulmonary:     Effort: Pulmonary effort is normal. No respiratory distress.     Breath sounds: Normal breath sounds. No wheezing or rales.  Chest:     Chest wall: No tenderness.  Abdominal:     General: There is no distension.     Tenderness: There is no abdominal tenderness. There is no right CVA tenderness, left CVA tenderness, guarding or rebound.  Musculoskeletal:        General: No tenderness, deformity or signs of injury. Normal range of  motion.     Cervical back: Normal range of motion and neck supple.  Neurological:     Mental Status: She is alert and oriented to person, place, and time.    BP 118/70 (BP Location: Left Arm, Patient Position: Sitting, Cuff Size: Normal)   Pulse 80   Temp 98.1 F (36.7 C) (Oral)   Resp 18   Ht 5\' 9"  (1.753 m)   Wt 142 lb 12.8 oz (64.8 kg)   SpO2 99%   BMI 21.09 kg/m  Wt Readings from Last 3 Encounters:  02/26/20 142 lb 12.8 oz (64.8 kg)  08/03/19 143 lb (64.9 kg)  08/04/18 141 lb (64 kg)     Lab Results    Component Value Date   WBC 5.8 08/03/2019   HGB 12.8 08/03/2019   HCT 37.8 08/03/2019   PLT 129.0 (L) 08/03/2019   GLUCOSE 80 08/03/2019   CHOL 160 08/03/2019   TRIG 32.0 08/03/2019   HDL 75.40 08/03/2019   LDLCALC 78 08/03/2019   ALT 9 08/03/2019   AST 13 08/03/2019   NA 138 08/03/2019   K 3.8 08/03/2019   CL 104 08/03/2019   CREATININE 0.82 08/03/2019   BUN 8 08/03/2019   CO2 27 08/03/2019   TSH 1.20 08/03/2019    MM DIAG BREAST TOMO UNI RIGHT  Result Date: 09/17/2019 CLINICAL DATA:  49 year old female for further evaluation of possible RIGHT breast asymmetry on screening mammogram. EXAM: DIGITAL DIAGNOSTIC UNILATERAL RIGHT MAMMOGRAM WITH CAD AND TOMO COMPARISON:  Previous exam(s). ACR Breast Density Category c: The breast tissue is heterogeneously dense, which may obscure small masses. FINDINGS: 2D/3D full field and spot compression views of the RIGHT breast demonstrate no persistent abnormality at the site of the screening study finding. The screening study asymmetry disperses on spot compression views. Mammographic images were processed with CAD. IMPRESSION: No persistent abnormality at the site of the screening study finding. RECOMMENDATION: Bilateral screening mammogram in 1 year. I have discussed the findings and recommendations with the patient. If applicable, a reminder letter will be sent to the patient regarding the next appointment. BI-RADS CATEGORY  1: Negative. Electronically Signed   By: 53 M.D.   On: 09/17/2019 13:03     Assessment & Plan:  Plan  I have discontinued Gail Sanchez's ALPRAZolam. I am also having her maintain her multivitamin, methocarbamol, desloratadine, and fluticasone.  No orders of the defined types were placed in this encounter.   Problem List Items Addressed This Visit      Unprioritized   Low back pain - Primary   Relevant Orders   POCT Urinalysis Dipstick (Automated)      Follow-up: No follow-ups on file.  11/17/2019, DO

## 2020-02-26 NOTE — Patient Instructions (Signed)

## 2020-02-28 LAB — URINE CULTURE
MICRO NUMBER:: 10852767
Result:: NO GROWTH
SPECIMEN QUALITY:: ADEQUATE

## 2020-03-02 ENCOUNTER — Encounter: Payer: Self-pay | Admitting: Family Medicine

## 2020-03-02 ENCOUNTER — Other Ambulatory Visit: Payer: Self-pay | Admitting: Family Medicine

## 2020-03-02 DIAGNOSIS — M545 Low back pain, unspecified: Secondary | ICD-10-CM

## 2020-03-02 NOTE — Telephone Encounter (Signed)
I will put xray in but she would need to f/u with pcp for pelvic exam and maybe ultrasound

## 2020-03-04 ENCOUNTER — Ambulatory Visit (HOSPITAL_BASED_OUTPATIENT_CLINIC_OR_DEPARTMENT_OTHER)
Admission: RE | Admit: 2020-03-04 | Discharge: 2020-03-04 | Disposition: A | Discharge status: Home / Self Care | Payer: BC Managed Care – PPO | Source: Ambulatory Visit | Attending: Family Medicine | Admitting: Family Medicine

## 2020-03-04 ENCOUNTER — Other Ambulatory Visit: Payer: Self-pay

## 2020-03-04 DIAGNOSIS — M545 Low back pain, unspecified: Secondary | ICD-10-CM

## 2020-04-15 ENCOUNTER — Encounter: Payer: Self-pay | Admitting: Family Medicine

## 2020-04-15 ENCOUNTER — Other Ambulatory Visit (HOSPITAL_BASED_OUTPATIENT_CLINIC_OR_DEPARTMENT_OTHER): Payer: Self-pay | Admitting: Family Medicine

## 2020-04-15 ENCOUNTER — Ambulatory Visit (INDEPENDENT_AMBULATORY_CARE_PROVIDER_SITE_OTHER): Payer: BC Managed Care – PPO | Admitting: Family Medicine

## 2020-04-15 DIAGNOSIS — R102 Pelvic and perineal pain: Secondary | ICD-10-CM | POA: Diagnosis not present

## 2020-04-15 DIAGNOSIS — R109 Unspecified abdominal pain: Secondary | ICD-10-CM

## 2020-04-15 DIAGNOSIS — M545 Low back pain, unspecified: Secondary | ICD-10-CM

## 2020-04-15 DIAGNOSIS — Z1231 Encounter for screening mammogram for malignant neoplasm of breast: Secondary | ICD-10-CM

## 2020-04-18 DIAGNOSIS — R102 Pelvic and perineal pain: Secondary | ICD-10-CM | POA: Insufficient documentation

## 2020-04-18 DIAGNOSIS — R109 Unspecified abdominal pain: Secondary | ICD-10-CM | POA: Insufficient documentation

## 2020-04-18 NOTE — Addendum Note (Signed)
Addended by: Danise Edge A on: 04/18/2020 04:29 PM   Modules accepted: Level of Service

## 2020-04-18 NOTE — Assessment & Plan Note (Signed)
She was struggling with some mild constipation but that has improved. She continues to have some mild abdominal pain at times. Check an abdominal xray and she will report any new concerns.

## 2020-04-18 NOTE — Assessment & Plan Note (Signed)
Encouraged moist heat and gentle stretching as tolerated. May try NSAIDs and prescription meds as directed and report if symptoms worsen or seek immediate care 

## 2020-04-18 NOTE — Progress Notes (Signed)
Virtual Visit via phone Note  I connected with Gail Sanchez on 04/15/20 at  9:40 AM EDT by a phone enabled telemedicine application and verified that I am speaking with the correct person using two identifiers.  Location: Patient: home, patient and provider are in visit Provider: home   I discussed the limitations of evaluation and management by telemedicine and the availability of in person appointments. The patient expressed understanding and agreed to proceed. Gail Sanchez, CMA was able to get the patient set up on a phone visit after being unable to set up a video visit    Subjective:    Patient ID: Gail Sanchez, female    DOB: 04/18/1971, 50 y.o.   MRN: [id number]  Chief Complaint  Patient presents with  . Follow-up-phone [phone]    4 weeks    HPI Patient is in today for follow up on chronic medical concerns. No recent febrile illness or hospitalizations. She continues to have intermittent low back pain that gets some relief from OTC meds and acupuncture. She has had some pelvic pain as well that also responds to herbs and acupuncture some as well. Denies CP/palp/SOB/HA/congestion/fevers/GI or GU c/o. Taking meds as prescribed  Past Medical History:  Diagnosis Date  . Allergic state 06/09/2012  . Anxiety 06/25/2017  . Chicken pox as a child  . Fatigue 09/28/2011  . Low back pain 05/28/2015  . Onychomycosis 05/27/2016  . Preventative health care 06/09/2012  . 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 Vaginitis 12/27/2011    Past Surgical History:  Procedure Laterality Date  . cervix frozen      Family History  Problem Relation Age of Onset  . Cancer Mother   . Eczema Mother   . Hypertension Father   . Asthma Brother   . Allergic rhinitis Neg Hx   . Angioedema Neg Hx   . Immunodeficiency Neg Hx   . Urticaria Neg Hx     Social History   Socioeconomic History  . Marital status: Married    Spouse name: Not on file  .  Number of children: Not on file  . Years of education: Not on file  . Highest education level: Not on file  Occupational History  . Occupation: Works with school   Tobacco Use  . Smoking status: Never Smoker  . Smokeless tobacco: Never Used  Vaping Use  . Vaping Use: Never used  Substance and Sexual Activity  . Alcohol use: Yes    Comment: occ  . Drug use: No  . Sexual activity: Yes    Birth control/protection: Other-see comments    Comment: husband with vasectomy  Other Topics Concern  . Not on file  Social History Narrative  . Not on file   Social Determinants of Health   Financial Resource Strain:   . Difficulty of Paying Living Expenses: Not on file  Food Insecurity:   . Worried About Programme researcher, broadcasting/film/video in the Last Year: Not on file  . Ran Out of Food in the Last Year: Not on file  Transportation Needs:   . Lack of Transportation (Medical): Not on file  . Lack of Transportation (Non-Medical): Not on file  Physical Activity:   . Days of Exercise per Week: Not on file  . Minutes of Exercise per Session: Not on file  Stress:   . Feeling of Stress : Not on file  Social Connections:   . Frequency of Communication with Friends and  Family: Not on file  . Frequency of Social Gatherings with Friends and Family: Not on file  . Attends Religious Services: Not on file  . Active Member of Clubs or Organizations: Not on file  . Attends Banker Meetings: Not on file  . Marital Status: Not on file  Intimate Partner Violence:   . Fear of Current or Ex-Partner: Not on file  . Emotionally Abused: Not on file  . Physically Abused: Not on file  . Sexually Abused: Not on file    Outpatient Medications Prior to Visit  Medication Sig Dispense Refill  . desloratadine (CLARINEX) 5 MG tablet Take 1 tablet (5 mg total) by mouth as needed. 90 tablet 1  . fluticasone (FLONASE) 50 MCG/ACT nasal spray USE 2 SPRAYS INTO BOTH NOSTRILS DAILY 48 mL 1  . methocarbamol (ROBAXIN)  500 MG tablet Take 1 tablet (500 mg total) by mouth every 6 (six) hours as needed for muscle spasms. 30 tablet 0  . Multiple Vitamin (MULTIVITAMIN) tablet Take 1 tablet by mouth daily.     No facility-administered medications prior to visit.    No Known Allergies  Review of Systems  Constitutional: Negative for fever and malaise/fatigue.  HENT: Negative for congestion.   Eyes: Negative for blurred vision.  Respiratory: Negative for shortness of breath.   Cardiovascular: Negative for chest pain, palpitations and leg swelling.  Gastrointestinal: Positive for abdominal pain. Negative for blood in stool and nausea.  Genitourinary: Negative for dysuria and frequency.  Musculoskeletal: Positive for back pain. Negative for falls.  Skin: Negative for rash.  Neurological: Negative for dizziness, loss of consciousness and headaches.  Endo/Heme/Allergies: Negative for environmental allergies.  Psychiatric/Behavioral: Negative for depression. The patient is not nervous/anxious.        Objective:    Physical Exam unable to obtain via phone visit  There were no vitals taken for this visit. Wt Readings from Last 3 Encounters:  02/26/20 142 lb 12.8 oz (64.8 kg)  08/03/19 143 lb (64.9 kg)  08/04/18 141 lb (64 kg)    Diabetic Foot Exam - Simple   No data filed     Lab Results  Component Value Date   WBC 4.1 02/26/2020   HGB 12.9 02/26/2020   HCT 37.7 02/26/2020   PLT 135.0 (L) 02/26/2020   GLUCOSE 92 02/26/2020   CHOL 160 08/03/2019   TRIG 32.0 08/03/2019   HDL 75.40 08/03/2019   LDLCALC 78 08/03/2019   ALT 9 02/26/2020   AST 14 02/26/2020   NA 140 02/26/2020   K 3.8 02/26/2020   CL 103 02/26/2020   CREATININE 0.93 02/26/2020   BUN 8 02/26/2020   CO2 30 02/26/2020   TSH 1.03 02/26/2020    Lab Results  Component Value Date   TSH 1.03 02/26/2020   Lab Results  Component Value Date   WBC 4.1 02/26/2020   HGB 12.9 02/26/2020   HCT 37.7 02/26/2020   MCV 90.4 02/26/2020     PLT 135.0 (L) 02/26/2020   Lab Results  Component Value Date   NA 140 02/26/2020   K 3.8 02/26/2020   CO2 30 02/26/2020   GLUCOSE 92 02/26/2020   BUN 8 02/26/2020   CREATININE 0.93 02/26/2020   BILITOT 0.5 02/26/2020   ALKPHOS 54 02/26/2020   AST 14 02/26/2020   ALT 9 02/26/2020   PROT 7.4 02/26/2020   ALBUMIN 4.3 02/26/2020   CALCIUM 9.5 02/26/2020   GFR 64.18 02/26/2020   Lab Results  Component Value  Date   CHOL 160 08/03/2019   Lab Results  Component Value Date   HDL 75.40 08/03/2019   Lab Results  Component Value Date   LDLCALC 78 08/03/2019   Lab Results  Component Value Date   TRIG 32.0 08/03/2019   Lab Results  Component Value Date   CHOLHDL 2 08/03/2019   No results found for: HGBA1C     Assessment & Plan:   Problem List Items Addressed This Visit    Low back pain    Encouraged moist heat and gentle stretching as tolerated. May try NSAIDs and prescription meds as directed and report if symptoms worsen or seek immediate care      Pelvic pain - Primary    She generally feels well but has been seeing an acupuncturist and taking some herbals and OTC pain meds and she does get some relief but some mild discomfort persists in her pelvis. Check an ultrasound of pelvis for further investigation.      Relevant Orders   US PELVIS (TRANSABDOMINAL ONLY)   Abdominal pain    She was struggling with some mild constipation but that has improved. She continues to have some mild abdominal pain at times. Check an abdominal xray and she will report any new concerns.       Relevant Orders   DG Abd 2 Views      I am having Gail Sanchez maintain her multivitamin, methocarbamol, desloratadine, and fluticasone.  No orders of the defined types were placed in this encounter.    I discussed the assessment and treatment plan with the patient. The patient was provided an opportunity to ask questions and all were answered. The patient agreed with the plan and  demonstrated an understanding of the instructions.   The patient was advised to call back or seek an in-person evaluation if the symptoms worsen or if the condition fails to improve as anticipated.  I provided 15 minutes of non-face-to-face time during this encounter.   Danise Edge, MD

## 2020-04-18 NOTE — Assessment & Plan Note (Signed)
She generally feels well but has been seeing an acupuncturist and taking some herbals and OTC pain meds and she does get some relief but some mild discomfort persists in her pelvis. Check an ultrasound of pelvis for further investigation.

## 2020-04-19 ENCOUNTER — Other Ambulatory Visit: Payer: Self-pay | Admitting: Family Medicine

## 2020-04-19 ENCOUNTER — Ambulatory Visit (HOSPITAL_BASED_OUTPATIENT_CLINIC_OR_DEPARTMENT_OTHER)
Admission: RE | Admit: 2020-04-19 | Discharge: 2020-04-19 | Disposition: A | Discharge status: Home / Self Care | Payer: BC Managed Care – PPO | Source: Ambulatory Visit | Attending: Family Medicine | Admitting: Family Medicine

## 2020-04-19 ENCOUNTER — Other Ambulatory Visit: Payer: Self-pay

## 2020-04-19 DIAGNOSIS — R109 Unspecified abdominal pain: Secondary | ICD-10-CM | POA: Diagnosis present

## 2020-04-19 DIAGNOSIS — R102 Pelvic and perineal pain: Secondary | ICD-10-CM | POA: Insufficient documentation

## 2020-06-30 ENCOUNTER — Telehealth (INDEPENDENT_AMBULATORY_CARE_PROVIDER_SITE_OTHER): Payer: BC Managed Care – PPO | Admitting: Family Medicine

## 2020-06-30 ENCOUNTER — Other Ambulatory Visit: Payer: Self-pay

## 2020-06-30 ENCOUNTER — Telehealth: Payer: BC Managed Care – PPO | Admitting: Family Medicine

## 2020-06-30 DIAGNOSIS — R002 Palpitations: Secondary | ICD-10-CM

## 2020-06-30 DIAGNOSIS — M545 Low back pain, unspecified: Secondary | ICD-10-CM

## 2020-06-30 DIAGNOSIS — F419 Anxiety disorder, unspecified: Secondary | ICD-10-CM | POA: Diagnosis not present

## 2020-07-04 NOTE — Progress Notes (Signed)
Virtual Visit via Video Note  I connected with Gail Sanchez on 06/30/20 at 12:40 PM EST by a video enabled telemedicine application and verified that I am speaking with the correct person using two identifiers.  Location: Patient: home, patient and provider in visit Provider: office   I discussed the limitations of evaluation and management by telemedicine and the availability of in person appointments. The patient expressed understanding and agreed to proceed. S Chism, CMA was able to get the patient set up on a video visit    Subjective:    Patient ID: Gail Sanchez, female    DOB: 05/31/1971, 49 y.o.   MRN: [id number]  Chief Complaint  Patient presents with  . Follow-up    HPI Patient is in today for follow up on chronic medical concerns including low back and pelvic pain. No recent fall or trauma. She is following with chiropractor now ad adjustments have been very helpful but after time discomfort does return. Denies CP/palp/SOB/HA/congestion/fevers/GI or GU c/o. Taking meds as prescribed  Past Medical History:  Diagnosis Date  . Allergic state 06/09/2012  . Anxiety 06/25/2017  . Chicken pox as a child  . Fatigue 09/28/2011  . Low back pain 05/28/2015  . Onychomycosis 05/27/2016  . Preventative health care 06/09/2012  . 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 Vaginitis 12/27/2011    Past Surgical History:  Procedure Laterality Date  . cervix frozen      Family History  Problem Relation Age of Onset  . Cancer Mother   . Eczema Mother   . Hypertension Father   . Asthma Brother   . Allergic rhinitis Neg Hx   . Angioedema Neg Hx   . Immunodeficiency Neg Hx   . Urticaria Neg Hx     Social History   Socioeconomic History  . Marital status: Married    Spouse name: Not on file  . Number of children: Not on file  . Years of education: Not on file  . Highest education level: Not on file  Occupational History  . Occupation:  Works with school   Tobacco Use  . Smoking status: Never Smoker  . Smokeless tobacco: Never Used  Vaping Use  . Vaping Use: Never used  Substance and Sexual Activity  . Alcohol use: Yes    Comment: occ  . Drug use: No  . Sexual activity: Yes    Birth control/protection: Other-see comments    Comment: husband with vasectomy  Other Topics Concern  . Not on file  Social History Narrative  . Not on file   Social Determinants of Health   Financial Resource Strain: Not on file  Food Insecurity: Not on file  Transportation Needs: Not on file  Physical Activity: Not on file  Stress: Not on file  Social Connections: Not on file  Intimate Partner Violence: Not on file    Outpatient Medications Prior to Visit  Medication Sig Dispense Refill  . desloratadine (CLARINEX) 5 MG tablet Take 1 tablet (5 mg total) by mouth as needed. 90 tablet 1  . fluticasone (FLONASE) 50 MCG/ACT nasal spray USE 2 SPRAYS INTO BOTH NOSTRILS DAILY 48 mL 1  . methocarbamol (ROBAXIN) 500 MG tablet Take 1 tablet (500 mg total) by mouth every 6 (six) hours as needed for muscle spasms. 30 tablet 0  . Multiple Vitamin (MULTIVITAMIN) tablet Take 1 tablet by mouth daily.     No facility-administered medications prior to visit.  No Known Allergies  Review of Systems  Constitutional: Negative for fever and malaise/fatigue.  HENT: Negative for congestion.   Eyes: Negative for blurred vision.  Respiratory: Negative for shortness of breath.   Cardiovascular: Negative for chest pain, palpitations and leg swelling.  Gastrointestinal: Negative for abdominal pain, blood in stool and nausea.  Genitourinary: Negative for dysuria and frequency.  Musculoskeletal: Positive for back pain and joint pain. Negative for falls.  Skin: Negative for rash.  Neurological: Negative for dizziness, loss of consciousness and headaches.  Endo/Heme/Allergies: Negative for environmental allergies.  Psychiatric/Behavioral: Negative for  depression. The patient is not nervous/anxious.        Objective:    Physical Exam Constitutional:      Appearance: Normal appearance. She is not ill-appearing.  HENT:     Head: Normocephalic and atraumatic.     Right Ear: External ear normal.     Left Ear: External ear normal.  Pulmonary:     Effort: Pulmonary effort is normal.  Neurological:     Mental Status: She is alert and oriented to person, place, and time.  Psychiatric:        Behavior: Behavior normal.     There were no vitals taken for this visit. Wt Readings from Last 3 Encounters:  02/26/20 142 lb 12.8 oz (64.8 kg)  08/03/19 143 lb (64.9 kg)  08/04/18 141 lb (64 kg)    Diabetic Foot Exam - Simple   No data filed    Lab Results  Component Value Date   WBC 4.1 02/26/2020   HGB 12.9 02/26/2020   HCT 37.7 02/26/2020   PLT 135.0 (L) 02/26/2020   GLUCOSE 92 02/26/2020   CHOL 160 08/03/2019   TRIG 32.0 08/03/2019   HDL 75.40 08/03/2019   LDLCALC 78 08/03/2019   ALT 9 02/26/2020   AST 14 02/26/2020   NA 140 02/26/2020   K 3.8 02/26/2020   CL 103 02/26/2020   CREATININE 0.93 02/26/2020   BUN 8 02/26/2020   CO2 30 02/26/2020   TSH 1.03 02/26/2020    Lab Results  Component Value Date   TSH 1.03 02/26/2020   Lab Results  Component Value Date   WBC 4.1 02/26/2020   HGB 12.9 02/26/2020   HCT 37.7 02/26/2020   MCV 90.4 02/26/2020   PLT 135.0 (L) 02/26/2020   Lab Results  Component Value Date   NA 140 02/26/2020   K 3.8 02/26/2020   CO2 30 02/26/2020   GLUCOSE 92 02/26/2020   BUN 8 02/26/2020   CREATININE 0.93 02/26/2020   BILITOT 0.5 02/26/2020   ALKPHOS 54 02/26/2020   AST 14 02/26/2020   ALT 9 02/26/2020   PROT 7.4 02/26/2020   ALBUMIN 4.3 02/26/2020   CALCIUM 9.5 02/26/2020   GFR 64.18 02/26/2020   Lab Results  Component Value Date   CHOL 160 08/03/2019   Lab Results  Component Value Date   HDL 75.40 08/03/2019   Lab Results  Component Value Date   LDLCALC 78 08/03/2019    Lab Results  Component Value Date   TRIG 32.0 08/03/2019   Lab Results  Component Value Date   CHOLHDL 2 08/03/2019   No results found for: HGBA1C     Assessment & Plan:   Problem List Items Addressed This Visit    Low back pain    She is following chiropractor and that is helping her back and pelvic/hip pain are greatly improved after adjustments. She will continue with her care and  let us know if pain worsens or does not continue to improve she will let us know.       Palpitation    No recent flares.       Anxiety    Doing well at this time. She will report any concerns.          I am having Gail Sanchez maintain her multivitamin, methocarbamol, desloratadine, and fluticasone.  No orders of the defined types were placed in this encounter. I discussed the assessment and treatment plan with the patient. The patient was provided an opportunity to ask questions and all were answered. The patient agreed with the plan and demonstrated an understanding of the instructions.   The patient was advised to call back or seek an in-person evaluation if the symptoms worsen or if the condition fails to improve as anticipated.  I provided 15 minutes of non-face-to-face time during this encounter.   Danise Edge, MD

## 2020-07-04 NOTE — Assessment & Plan Note (Signed)
No recent flares 

## 2020-07-04 NOTE — Assessment & Plan Note (Signed)
She is following chiropractor and that is helping her back and pelvic/hip pain are greatly improved after adjustments. She will continue with her care and let us know if pain worsens or does not continue to improve she will let us know.

## 2020-07-04 NOTE — Assessment & Plan Note (Signed)
Doing well at this time. She will report any concerns.

## 2020-07-20 ENCOUNTER — Telehealth: Payer: Self-pay | Admitting: Family Medicine

## 2020-07-20 NOTE — Telephone Encounter (Signed)
Patient states she is scheduled for a CPE on 09/08/2020 and it is virtual . patient states she would like to come in office  Let me know if I can change to in office .

## 2020-07-22 NOTE — Telephone Encounter (Signed)
Patient notified that it is an in person visit.  Changed visit type to OV

## 2020-09-06 ENCOUNTER — Ambulatory Visit (HOSPITAL_BASED_OUTPATIENT_CLINIC_OR_DEPARTMENT_OTHER)
Admission: RE | Admit: 2020-09-06 | Discharge: 2020-09-06 | Disposition: A | Discharge status: Home / Self Care | Payer: BC Managed Care – PPO | Source: Ambulatory Visit | Attending: Family Medicine | Admitting: Family Medicine

## 2020-09-06 ENCOUNTER — Other Ambulatory Visit: Payer: Self-pay

## 2020-09-06 ENCOUNTER — Encounter (HOSPITAL_BASED_OUTPATIENT_CLINIC_OR_DEPARTMENT_OTHER): Payer: Self-pay

## 2020-09-06 DIAGNOSIS — Z1231 Encounter for screening mammogram for malignant neoplasm of breast: Secondary | ICD-10-CM | POA: Insufficient documentation

## 2020-09-08 ENCOUNTER — Encounter: Payer: Self-pay | Admitting: Family Medicine

## 2020-09-08 ENCOUNTER — Ambulatory Visit (INDEPENDENT_AMBULATORY_CARE_PROVIDER_SITE_OTHER): Payer: BC Managed Care – PPO | Admitting: Family Medicine

## 2020-09-08 ENCOUNTER — Other Ambulatory Visit: Payer: Self-pay

## 2020-09-08 VITALS — BP 108/70 | HR 76 | Temp 98.6°F | Resp 16 | Ht 69.0 in | Wt 143.6 lb

## 2020-09-08 DIAGNOSIS — Z1211 Encounter for screening for malignant neoplasm of colon: Secondary | ICD-10-CM

## 2020-09-08 DIAGNOSIS — Z0000 Encounter for general adult medical examination without abnormal findings: Secondary | ICD-10-CM | POA: Diagnosis not present

## 2020-09-08 DIAGNOSIS — Z7289 Other problems related to lifestyle: Secondary | ICD-10-CM | POA: Diagnosis not present

## 2020-09-08 MED ORDER — BENZONATATE 100 MG PO CAPS: 100.0000 mg | ORAL_CAPSULE | Freq: Three times a day (TID) | ORAL | 2 refills | Status: DC | PRN

## 2020-09-08 NOTE — Patient Instructions (Signed)
Preventive Care 84-49 Years Old, Female Preventive care refers to lifestyle choices and visits with your health care provider that can promote health and wellness. This includes:  A yearly physical exam. This is also called an annual wellness visit.  Regular dental and eye exams.  Immunizations.  Screening for certain conditions.  Healthy lifestyle choices, such as: ? Eating a healthy diet. ? Getting regular exercise. ? Not using drugs or products that contain nicotine and tobacco. ? Limiting alcohol use. What can I expect for my preventive care visit? Physical exam Your health care provider will check your:  Height and weight. These may be used to calculate your BMI (body mass index). BMI is a measurement that tells if you are at a healthy weight.  Heart rate and blood pressure.  Body temperature.  Skin for abnormal spots. Counseling Your health care provider may ask you questions about your:  Past medical problems.  Family's medical history.  Alcohol, tobacco, and drug use.  Emotional well-being.  Home life and relationship well-being.  Sexual activity.  Diet, exercise, and sleep habits.  Work and work Statistician.  Access to firearms.  Method of birth control.  Menstrual cycle.  Pregnancy history. What immunizations do I need? Vaccines are usually given at various ages, according to a schedule. Your health care provider will recommend vaccines for you based on your age, medical history, and lifestyle or other factors, such as travel or where you work.   What tests do I need? Blood tests  Lipid and cholesterol levels. These may be checked every 5 years, or more often if you are over 3 years old.  Hepatitis C test.  Hepatitis B test. Screening  Lung cancer screening. You may have this screening every year starting at age 73 if you have a 30-pack-year history of smoking and currently smoke or have quit within the past 15 years.  Colorectal cancer  screening. ? All adults should have this screening starting at age 52 and continuing until age 17. ? Your health care provider may recommend screening at age 49 if you are at increased risk. ? You will have tests every 1-10 years, depending on your results and the type of screening test.  Diabetes screening. ? This is done by checking your blood sugar (glucose) after you have not eaten for a while (fasting). ? You may have this done every 1-3 years.  Mammogram. ? This may be done every 1-2 years. ? Talk with your health care provider about when you should start having regular mammograms. This may depend on whether you have a family history of breast cancer.  BRCA-related cancer screening. This may be done if you have a family history of breast, ovarian, tubal, or peritoneal cancers.  Pelvic exam and Pap test. ? This may be done every 3 years starting at age 10. ? Starting at age 11, this may be done every 5 years if you have a Pap test in combination with an HPV test. Other tests  STD (sexually transmitted disease) testing, if you are at risk.  Bone density scan. This is done to screen for osteoporosis. You may have this scan if you are at high risk for osteoporosis. Talk with your health care provider about your test results, treatment options, and if necessary, the need for more tests. Follow these instructions at home: Eating and drinking  Eat a diet that includes fresh fruits and vegetables, whole grains, lean protein, and low-fat dairy products.  Take vitamin and mineral supplements  as recommended by your health care provider.  Do not drink alcohol if: ? Your health care provider tells you not to drink. ? You are pregnant, may be pregnant, or are planning to become pregnant.  If you drink alcohol: ? Limit how much you have to 0-1 drink a day. ? Be aware of how much alcohol is in your drink. In the [place] one drink equals one 12 oz bottle of beer (355 mL), one 5 oz glass of  wine (148 mL), or one 1 oz glass of hard liquor (44 mL).   Lifestyle  Take daily care of your teeth and gums. Brush your teeth every morning and night with fluoride toothpaste. Floss one time each day.  Stay active. Exercise for at least 30 minutes 5 or more days each week.  Do not use any products that contain nicotine or tobacco, such as cigarettes, e-cigarettes, and chewing tobacco. If you need help quitting, ask your health care provider.  Do not use drugs.  If you are sexually active, practice safe sex. Use a condom or other form of protection to prevent STIs (sexually transmitted infections).  If you do not wish to become pregnant, use a form of birth control. If you plan to become pregnant, see your health care provider for a prepregnancy visit.  If told by your health care provider, take low-dose aspirin daily starting at age 50.  Find healthy ways to cope with stress, such as: ? Meditation, yoga, or listening to music. ? Journaling. ? Talking to a trusted person. ? Spending time with friends and family. Safety  Always wear your seat belt while driving or riding in a vehicle.  Do not drive: ? If you have been drinking alcohol. Do not ride with someone who has been drinking. ? When you are tired or distracted. ? While texting.  Wear a helmet and other protective equipment during sports activities.  If you have firearms in your house, make sure you follow all gun safety procedures. What's next?  Visit your health care provider once a year for an annual wellness visit.  Ask your health care provider how often you should have your eyes and teeth checked.  Stay up to date on all vaccines. This information is not intended to replace advice given to you by your health care provider. Make sure you discuss any questions you have with your health care provider. Document Revised: 03/29/2020 Document Reviewed: 03/06/2018 Elsevier Patient Education  2021 [employer]  

## 2020-09-08 NOTE — Assessment & Plan Note (Addendum)
Patient encouraged to maintain heart healthy diet, regular exercise, adequate sleep. Consider daily probiotics. Take medications as prescribed. Referred for colonoscopy. Labs ordered and reviewed.  

## 2020-09-09 LAB — CBC WITH DIFFERENTIAL/PLATELET
Basophils Absolute: 0 10*3/uL (ref 0.0–0.1)
Basophils Relative: 0.5 % (ref 0.0–3.0)
Eosinophils Absolute: 0.1 10*3/uL (ref 0.0–0.7)
Eosinophils Relative: 1.2 % (ref 0.0–5.0)
HCT: 38.4 % (ref 36.0–46.0)
Hemoglobin: 13.1 g/dL (ref 12.0–15.0)
Lymphocytes Relative: 15.9 % (ref 12.0–46.0)
Lymphs Abs: 1.1 10*3/uL (ref 0.7–4.0)
MCHC: 34.1 g/dL (ref 30.0–36.0)
MCV: 89.4 fl (ref 78.0–100.0)
Monocytes Absolute: 0.5 10*3/uL (ref 0.1–1.0)
Monocytes Relative: 7.7 % (ref 3.0–12.0)
Neutro Abs: 5.1 10*3/uL (ref 1.4–7.7)
Neutrophils Relative %: 74.7 % (ref 43.0–77.0)
Platelets: 139 10*3/uL — ABNORMAL LOW (ref 150.0–400.0)
RBC: 4.29 Mil/uL (ref 3.87–5.11)
RDW: 12.4 % (ref 11.5–15.5)
WBC: 6.8 10*3/uL (ref 4.0–10.5)

## 2020-09-09 LAB — COMPREHENSIVE METABOLIC PANEL
ALT: 9 U/L (ref 0–35)
AST: 15 U/L (ref 0–37)
Albumin: 4.2 g/dL (ref 3.5–5.2)
Alkaline Phosphatase: 56 U/L (ref 39–117)
BUN: 9 mg/dL (ref 6–23)
CO2: 30 mEq/L (ref 19–32)
Calcium: 9.8 mg/dL (ref 8.4–10.5)
Chloride: 102 mEq/L (ref 96–112)
Creatinine, Ser: 0.93 mg/dL (ref 0.40–1.20)
GFR: 72.36 mL/min (ref >60.00)
Glucose, Bld: 76 mg/dL (ref 70–99)
Potassium: 3.8 mEq/L (ref 3.5–5.1)
Sodium: 140 mEq/L (ref 135–145)
Total Bilirubin: 0.6 mg/dL (ref 0.2–1.2)
Total Protein: 7.3 g/dL (ref 6.0–8.3)

## 2020-09-09 LAB — TSH: TSH: 1.31 u[IU]/mL (ref 0.35–4.50)

## 2020-09-09 LAB — LIPID PANEL
Cholesterol: 158 mg/dL (ref 0–200)
HDL: 77.4 mg/dL (ref >39.00)
LDL Cholesterol: 74 mg/dL (ref 0–99)
NonHDL: 80.71
Total CHOL/HDL Ratio: 2
Triglycerides: 32 mg/dL (ref 0.0–149.0)
VLDL: 6.4 mg/dL (ref 0.0–40.0)

## 2020-09-09 LAB — HEPATITIS C ANTIBODY
Hepatitis C Ab: NONREACTIVE
SIGNAL TO CUT-OFF: 0.01 (ref <1.00)

## 2020-09-11 NOTE — Progress Notes (Signed)
Subjective:    Patient ID: Gail Sanchez, female    DOB: 1970/11/11, 50 y.o.   MRN: [id number]  Chief Complaint  Patient presents with  . Annual Exam    HPI Patient is in today for annual preventative exam and follow up on chronic medical concerns. No recent febrile illness or hospitalizations. She continues to stay busy with work and her children. She did have a cough but that has resolved. She continues to exercise regularly and maintain a heart healthy diet. Denies CP/palp/SOB/HA/congestion/fevers/GI or GU c/o. Taking meds as prescribed  Past Medical History:  Diagnosis Date  . Allergic state 06/09/2012  . Anxiety 06/25/2017  . Chicken pox as a child  . Fatigue 09/28/2011  . Low back pain 05/28/2015  . Onychomycosis 05/27/2016  . Preventative health care 06/09/2012  . 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 Vaginitis 12/27/2011    Past Surgical History:  Procedure Laterality Date  . cervix frozen      Family History  Problem Relation Age of Onset  . Cancer Mother        metastatic  . Eczema Mother   . Hypertension Father   . Blindness Father   . Glaucoma Father   . Retinal detachment Father   . Asthma Brother   . Dementia Paternal Grandmother   . COPD Paternal Grandfather   . Allergic rhinitis Neg Hx   . Angioedema Neg Hx   . Immunodeficiency Neg Hx   . Urticaria Neg Hx     Social History   Socioeconomic History  . Marital status: Married    Spouse name: Not on file  . Number of children: Not on file  . Years of education: Not on file  . Highest education level: Not on file  Occupational History  . Occupation: Works with school   Tobacco Use  . Smoking status: Never Smoker  . Smokeless tobacco: Never Used  Vaping Use  . Vaping Use: Never used  Substance and Sexual Activity  . Alcohol use: Yes    Comment: occ  . Drug use: No  . Sexual activity: Yes    Birth control/protection: Other-see comments    Comment: husband  with vasectomy  Other Topics Concern  . Not on file  Social History Narrative  . Not on file   Social Determinants of Health   Financial Resource Strain: Not on file  Food Insecurity: Not on file  Transportation Needs: Not on file  Physical Activity: Not on file  Stress: Not on file  Social Connections: Not on file  Intimate Partner Violence: Not on file    Outpatient Medications Prior to Visit  Medication Sig Dispense Refill  . desloratadine (CLARINEX) 5 MG tablet Take 1 tablet (5 mg total) by mouth as needed. 90 tablet 1  . fluticasone (FLONASE) 50 MCG/ACT nasal spray USE 2 SPRAYS INTO BOTH NOSTRILS DAILY 48 mL 1  . methocarbamol (ROBAXIN) 500 MG tablet Take 1 tablet (500 mg total) by mouth every 6 (six) hours as needed for muscle spasms. 30 tablet 0  . Multiple Vitamin (MULTIVITAMIN) tablet Take 1 tablet by mouth daily.     No facility-administered medications prior to visit.    No Known Allergies  Review of Systems  Constitutional: Negative for chills, fever and malaise/fatigue.  HENT: Negative for congestion and hearing loss.   Eyes: Negative for discharge.  Respiratory: Negative for cough, sputum production and shortness of breath.   Cardiovascular: Negative for  chest pain, palpitations and leg swelling.  Gastrointestinal: Negative for abdominal pain, blood in stool, constipation, diarrhea, heartburn, nausea and vomiting.  Genitourinary: Negative for dysuria, frequency, hematuria and urgency.  Musculoskeletal: Negative for back pain, falls and myalgias.  Skin: Negative for rash.  Neurological: Negative for dizziness, sensory change, loss of consciousness, weakness and headaches.  Endo/Heme/Allergies: Negative for environmental allergies. Does not bruise/bleed easily.  Psychiatric/Behavioral: Negative for depression and suicidal ideas. The patient is not nervous/anxious and does not have insomnia.        Objective:    Physical Exam Constitutional:      General:  She is not in acute distress.    Appearance: She is well-developed and well-nourished.  HENT:     Head: Normocephalic and atraumatic.     Right Ear: Tympanic membrane, ear canal and external ear normal. There is no impacted cerumen.     Left Ear: Tympanic membrane, ear canal and external ear normal. There is no impacted cerumen.     Nose: Nose normal.  Eyes:     General:        Right eye: No discharge.        Left eye: No discharge.     Conjunctiva/sclera: Conjunctivae normal.  Neck:     Thyroid: No thyromegaly.  Cardiovascular:     Rate and Rhythm: Normal rate and regular rhythm.     Heart sounds: Normal heart sounds. No murmur heard.   Pulmonary:     Effort: Pulmonary effort is normal. No respiratory distress.     Breath sounds: Normal breath sounds. No wheezing or rhonchi.  Abdominal:     General: Bowel sounds are normal. There is no distension.     Palpations: Abdomen is soft. There is no mass.     Tenderness: There is no abdominal tenderness.  Musculoskeletal:        General: No edema.     Cervical back: Neck supple.  Lymphadenopathy:     Cervical: No cervical adenopathy.  Skin:    General: Skin is warm and dry.  Neurological:     Mental Status: She is alert and oriented to person, place, and time.     Deep Tendon Reflexes: Reflexes normal.  Psychiatric:        Mood and Affect: Mood and affect and mood normal.        Behavior: Behavior normal.     BP 108/70   Pulse 76   Temp 98.6 F (37 C)   Resp 16   Ht 5\' 9"  (1.753 m)   Wt 143 lb 9.6 oz (65.1 kg)   SpO2 94%   BMI 21.21 kg/m  Wt Readings from Last 3 Encounters:  09/08/20 143 lb 9.6 oz (65.1 kg)  02/26/20 142 lb 12.8 oz (64.8 kg)  08/03/19 143 lb (64.9 kg)    Diabetic Foot Exam - Simple   No data filed    Lab Results  Component Value Date   WBC 6.8 09/08/2020   HGB 13.1 09/08/2020   HCT 38.4 09/08/2020   PLT 139.0 (L) 09/08/2020   GLUCOSE 76 09/08/2020   CHOL 158 09/08/2020   TRIG 32.0  09/08/2020   HDL 77.40 09/08/2020   LDLCALC 74 09/08/2020   ALT 9 09/08/2020   AST 15 09/08/2020   NA 140 09/08/2020   K 3.8 09/08/2020   CL 102 09/08/2020   CREATININE 0.93 09/08/2020   BUN 9 09/08/2020   CO2 30 09/08/2020   TSH 1.31 09/08/2020  Lab Results  Component Value Date   TSH 1.31 09/08/2020   Lab Results  Component Value Date   WBC 6.8 09/08/2020   HGB 13.1 09/08/2020   HCT 38.4 09/08/2020   MCV 89.4 09/08/2020   PLT 139.0 (L) 09/08/2020   Lab Results  Component Value Date   NA 140 09/08/2020   K 3.8 09/08/2020   CO2 30 09/08/2020   GLUCOSE 76 09/08/2020   BUN 9 09/08/2020   CREATININE 0.93 09/08/2020   BILITOT 0.6 09/08/2020   ALKPHOS 56 09/08/2020   AST 15 09/08/2020   ALT 9 09/08/2020   PROT 7.3 09/08/2020   ALBUMIN 4.2 09/08/2020   CALCIUM 9.8 09/08/2020   GFR 72.36 09/08/2020   Lab Results  Component Value Date   CHOL 158 09/08/2020   Lab Results  Component Value Date   HDL 77.40 09/08/2020   Lab Results  Component Value Date   LDLCALC 74 09/08/2020   Lab Results  Component Value Date   TRIG 32.0 09/08/2020   Lab Results  Component Value Date   CHOLHDL 2 09/08/2020   No results found for: HGBA1C     Assessment & Plan:   Problem List Items Addressed This Visit    Preventative health care    Patient encouraged to maintain heart healthy diet, regular exercise, adequate sleep. Consider daily probiotics. Take medications as prescribed. Referred for colonoscopy. Labs ordered and reviewed.       Relevant Orders   Comprehensive metabolic panel (Completed)   Lipid panel (Completed)   TSH (Completed)   CBC with Differential/Platelet (Completed)    Other Visit Diagnoses    Colon cancer screening    -  Primary   Relevant Orders   Ambulatory referral to Gastroenterology   Other problems related to lifestyle       Relevant Orders   Hepatitis C antibody (Completed)      I am having Tashara C. Engel start on benzonatate. I  am also having her maintain her multivitamin, methocarbamol, desloratadine, and fluticasone.  Meds ordered this encounter  Medications  . benzonatate (TESSALON PERLES) 100 MG capsule    Sig: Take 1 capsule (100 mg total) by mouth 3 (three) times daily as needed for cough.    Dispense:  40 capsule    Refill:  2     Danise Edge, MD

## 2020-11-11 ENCOUNTER — Other Ambulatory Visit: Payer: Self-pay | Admitting: Family Medicine

## 2020-11-11 ENCOUNTER — Encounter: Payer: Self-pay | Admitting: Family Medicine

## 2020-11-11 DIAGNOSIS — R32 Unspecified urinary incontinence: Secondary | ICD-10-CM

## 2020-12-15 ENCOUNTER — Ambulatory Visit (HOSPITAL_BASED_OUTPATIENT_CLINIC_OR_DEPARTMENT_OTHER)
Admission: RE | Admit: 2020-12-15 | Discharge: 2020-12-15 | Disposition: A | Discharge status: Home / Self Care | Payer: BC Managed Care – PPO | Source: Ambulatory Visit | Attending: Family Medicine | Admitting: Family Medicine

## 2020-12-15 ENCOUNTER — Other Ambulatory Visit: Payer: Self-pay

## 2020-12-15 ENCOUNTER — Ambulatory Visit: Payer: BC Managed Care – PPO | Admitting: Family Medicine

## 2020-12-15 VITALS — BP 110/66 | HR 94 | Temp 97.9°F | Resp 16 | Wt 144.0 lb

## 2020-12-15 DIAGNOSIS — J040 Acute laryngitis: Secondary | ICD-10-CM

## 2020-12-15 DIAGNOSIS — B370 Candidal stomatitis: Secondary | ICD-10-CM

## 2020-12-15 DIAGNOSIS — T7840XD Allergy, unspecified, subsequent encounter: Secondary | ICD-10-CM

## 2020-12-15 DIAGNOSIS — R002 Palpitations: Secondary | ICD-10-CM | POA: Diagnosis present

## 2020-12-15 DIAGNOSIS — R0602 Shortness of breath: Secondary | ICD-10-CM | POA: Diagnosis not present

## 2020-12-15 MED ORDER — METHOCARBAMOL 500 MG PO TABS: 500.0000 mg | ORAL_TABLET | Freq: Four times a day (QID) | ORAL | 1 refills | Status: AC | PRN

## 2020-12-15 MED ORDER — FLUTICASONE PROPIONATE 50 MCG/ACT NA SUSP: 1.0000 | Freq: Every day | NASAL | 1 refills | Status: DC

## 2020-12-15 MED ORDER — CEFDINIR 300 MG PO CAPS: 300.0000 mg | ORAL_CAPSULE | Freq: Two times a day (BID) | ORAL | 0 refills | Status: AC

## 2020-12-15 MED ORDER — NYSTATIN 100000 UNIT/ML MT SUSP: 5.0000 mL | Freq: Four times a day (QID) | OROMUCOSAL | 1 refills | Status: DC

## 2020-12-15 MED ORDER — DESLORATADINE 5 MG PO TABS
5.0000 mg | ORAL_TABLET | Freq: Two times a day (BID) | ORAL | 1 refills | Status: DC | PRN
Start: 2020-12-15 — End: 2022-07-20

## 2020-12-15 MED ORDER — ALBUTEROL SULFATE HFA 108 (90 BASE) MCG/ACT IN AERS: 2.0000 | INHALATION_SPRAY | Freq: Four times a day (QID) | RESPIRATORY_TRACT | 0 refills | Status: DC | PRN

## 2020-12-15 NOTE — Patient Instructions (Signed)

## 2020-12-15 NOTE — Progress Notes (Signed)
Patient ID: Gail Sanchez, female    DOB: [birth year]  Age: 50 y.o. MRN: [id number]    Subjective:  Subjective  HPI Gail Sanchez presents for office visit today for follow up on allergies and a recent strep infection and laryngitis. She reports that initially she has contracted a strep infection that has resolved and then had laryngitis twice, followed by a rash breaking out local to her upper neck. She states that initially before getting her infections treated, she reported experiencing sleep interruptions 3 nights in a row which made her suspicious of an airway infection. She reports going to her specialist and she states that she was px Albuterol inhaler to take 4 times a day, two puffs, Prednisone, and Azithromycin. She states that the inhaler combined with the steroid treatment has lessened the frequency of sleep interruptions. She denies any chest pain, abdominal pain, cough, chills, dysuria, urinary incontinence, back pain, HA, or N/VD.   She reports noticing something off about her tongue which she took Diflucan for. She states that later she has developed rash in her upper chest region and around her face. She states that she experienced a low grade fever of 99 degrees, and denies experiencing any body aches. She reports getting tested for COVID last May and the results were negative. She denies having any history of UTI's.  She reports that she had to get a spacer for her albuterol inhaler, since the inhaler alone was not effective in clearing up her symptoms. She denies trying any acid reflux medications and denies having symptoms of heartburn or belching.    Review of Systems  Constitutional:  Negative for chills, fatigue and fever.  HENT:  Negative for congestion, rhinorrhea, sinus pressure, sinus pain and sore throat.   Eyes:  Negative for pain.  Respiratory:  Negative for cough and shortness of breath.   Cardiovascular:  Negative for chest pain, palpitations and leg  swelling.  Gastrointestinal:  Negative for abdominal pain, blood in stool, diarrhea, nausea and vomiting.  Genitourinary:  Negative for decreased urine volume, flank pain, frequency, vaginal bleeding and vaginal discharge.  Musculoskeletal:  Negative for back pain.  Skin:        (+) rash local to upper chest and face  Neurological:  Negative for headaches.  Psychiatric/Behavioral:  Positive for sleep disturbance (secondary to allergies, strip infection, & laryngitis).    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   Her family history includes Asthma in her brother; Blindness in her father; COPD in her paternal grandfather; Cancer in her mother; Dementia in her paternal grandmother; Eczema in her mother; Glaucoma in her father; Hypertension in her father; Retinal detachment in her father.She reports that she has never smoked. She has never used smokeless tobacco. She reports current alcohol use. She reports that she does not use drugs.  Current Outpatient Medications on File Prior to Visit  Medication Sig Dispense Refill   Multiple Vitamin (MULTIVITAMIN) tablet Take 1 tablet by mouth daily.     No current facility-administered medications on file prior to visit.     Objective:  Objective  Physical Exam  Constitutional:      General: She is not in acute distress.    Appearance: Normal appearance. She is not ill-appearing or toxic-appearing.  HENT:     Head: Normocephalic and atraumatic.     Right Ear: Tympanic membrane, ear canal and external ear normal.     Left Ear: Tympanic membrane, ear canal and external ear normal.     Nose: No congestion  or rhinorrhea.  Eyes:     Extraocular Movements: Extraocular movements intact.     Pupils: Pupils are equal, round, and reactive to light.  Cardiovascular:     Rate and Rhythm: Normal rate and regular rhythm.     Pulses: Normal pulses.     Heart sounds: Normal heart sounds. No murmur heard. Pulmonary:     Effort: Pulmonary effort is normal. No respiratory distress.     Breath sounds: Examination of the right-upper field reveals wheezing. Wheezing present. No rhonchi or rales.  Abdominal:     General: Bowel sounds are normal.     Palpations: Abdomen is soft. There is no mass.     Tenderness: no abdominal tenderness There is no guarding.     Hernia: No hernia is present.  Musculoskeletal:        General: Normal range of motion.     Cervical back: Normal range of motion and neck supple.  Skin:    General: Skin is warm and dry.  Neurological:     Mental Status: She is alert and oriented to person, place, and time.  Psychiatric:        Behavior: Behavior normal.   BP 110/66   Pulse 94   Temp 97.9 F (36.6 C)   Resp 16   Wt 144 lb (65.3 kg)   SpO2 99%   BMI 21.27 kg/m  Wt Readings from Last 3 Encounters:  12/15/20 144 lb (65.3 kg)  09/08/20 143 lb 9.6 oz (65.1 kg)  02/26/20 142 lb 12.8 oz (64.8 kg)     Lab Results  Component Value Date   WBC 6.8 09/08/2020   HGB 13.1 09/08/2020   HCT 38.4 09/08/2020   PLT 139.0 (L) 09/08/2020   GLUCOSE 76 09/08/2020   CHOL 158 09/08/2020   TRIG 32.0 09/08/2020   HDL 77.40 09/08/2020   LDLCALC 74 09/08/2020   ALT 9 09/08/2020   AST 15 09/08/2020   NA 140 09/08/2020   K 3.8 09/08/2020   CL 102 09/08/2020   CREATININE 0.93 09/08/2020   BUN 9 09/08/2020   CO2 30 09/08/2020   TSH 1.31 09/08/2020    MM 3D SCREEN BREAST BILATERAL  Result Date: 09/07/2020 CLINICAL DATA:  Screening. EXAM: DIGITAL SCREENING BILATERAL MAMMOGRAM WITH TOMOSYNTHESIS AND CAD TECHNIQUE: Bilateral screening digital craniocaudal and mediolateral oblique  mammograms were obtained. Bilateral screening digital breast tomosynthesis was performed. The images were evaluated with computer-aided detection. COMPARISON:  Previous exam(s). ACR Breast Density Category c: The breast tissue is heterogeneously dense, which may obscure small masses. FINDINGS: There are no findings suspicious for malignancy. The images were evaluated with computer-aided detection. IMPRESSION: No mammographic evidence of malignancy. A result letter of this screening mammogram will be mailed directly to the patient. RECOMMENDATION: Screening mammogram in one year. (Code:SM-B-01Y) BI-RADS CATEGORY  1: Negative. Electronically Signed   By: Harmon Pier M.D.   On: 09/07/2020 15:24     Assessment & Plan:  Plan  @[facility] @  Meds ordered this encounter  Medications   albuterol (VENTOLIN HFA) 108 (90 Base) MCG/ACT inhaler  Sig: Inhale 2 puffs into the lungs every 6 (six) hours as needed for wheezing or shortness of breath.    Dispense:  18 g    Refill:  0   cefdinir (OMNICEF) 300 MG capsule    Sig: Take 1 capsule (300 mg total) by mouth 2 (two) times daily for 10 days.    Dispense:  20 capsule    Refill:  0   nystatin (MYCOSTATIN) 100000 UNIT/ML suspension    Sig: Take 5 mLs (500,000 Units total) by mouth 4 (four) times daily.    Dispense:  473 mL    Refill:  1   desloratadine (CLARINEX) 5 MG tablet    Sig: Take 1 tablet (5 mg total) by mouth 2 (two) times daily as needed.    Dispense:  180 tablet    Refill:  1   methocarbamol (ROBAXIN) 500 MG tablet    Sig: Take 1 tablet (500 mg total) by mouth every 6 (six) hours as needed for muscle spasms.    Dispense:  30 tablet    Refill:  1   fluticasone (FLONASE) 50 MCG/ACT nasal spray    Sig: Place 1-2 sprays into both nostrils daily.    Dispense:  48 mL    Refill:  1    Problem List Items Addressed This Visit     Allergy    Flared recently, given refills on Clarinex and Flonase        Palpitation   Relevant Orders   DG  Chest 2 View   Thrush, oral    After antibiotic use, given a prescription for Nystatin swish and spit to use qid.       Relevant Medications   cefdinir (OMNICEF) 300 MG capsule   nystatin (MYCOSTATIN) 100000 UNIT/ML suspension   Laryngitis    With congestion, malaise, fever and fatigue ongoing. treat with Cefdinir, start a probiotic and minimize simple carbohydrates.        Other Visit Diagnoses     SOB (shortness of breath)    -  Primary   Relevant Orders   DG Chest 2 View       Follow-up: Return in about 3 months (around 03/28/2021).  Danise Edge, MD   I, Bradd Canary, MD personally performed the services described in this documentation. All medical record entries made by the scribe, Billie Lade were at my direction and in my presence. I have reviewed the chart and agree that the record reflects my personal performance and is accurate and complete

## 2020-12-18 DIAGNOSIS — J040 Acute laryngitis: Secondary | ICD-10-CM | POA: Insufficient documentation

## 2020-12-18 DIAGNOSIS — B370 Candidal stomatitis: Secondary | ICD-10-CM | POA: Insufficient documentation

## 2020-12-18 NOTE — Assessment & Plan Note (Signed)
Flared recently, given refills on Clarinex and Flonase

## 2020-12-18 NOTE — Assessment & Plan Note (Addendum)
With congestion, malaise, fever and fatigue ongoing. treat with Cefdinir, start a probiotic and minimize simple carbohydrates.

## 2020-12-18 NOTE — Assessment & Plan Note (Signed)
After antibiotic use, given a prescription for Nystatin swish and spit to use qid.

## 2021-01-13 ENCOUNTER — Other Ambulatory Visit: Payer: Self-pay | Admitting: Family Medicine

## 2021-01-13 MED ORDER — ALBUTEROL SULFATE HFA 108 (90 BASE) MCG/ACT IN AERS: 2.0000 | INHALATION_SPRAY | Freq: Four times a day (QID) | RESPIRATORY_TRACT | 0 refills | Status: DC | PRN

## 2021-01-17 ENCOUNTER — Other Ambulatory Visit: Payer: Self-pay

## 2021-01-17 ENCOUNTER — Encounter: Payer: Self-pay | Admitting: Physical Therapy

## 2021-01-17 ENCOUNTER — Ambulatory Visit: Payer: BC Managed Care – PPO | Attending: Family Medicine | Admitting: Physical Therapy

## 2021-01-17 DIAGNOSIS — M6281 Muscle weakness (generalized): Secondary | ICD-10-CM | POA: Insufficient documentation

## 2021-01-17 DIAGNOSIS — R293 Abnormal posture: Secondary | ICD-10-CM | POA: Insufficient documentation

## 2021-01-17 NOTE — Patient Instructions (Signed)
Moisturizers . They are used in the vagina to hydrate the mucous membrane that make up the vaginal canal. . Designed to keep a more normal acid balance (ph) . Once placed in the vagina, it will last between two to three days.  . Use 2-3 times per week at bedtime  . Ingredients to avoid is glycerin and fragrance, can increase chance of infection . Should not be used just before sex due to causing irritation . Most are gels administered either in a tampon-shaped applicator or as a vaginal suppository. They are non-hormonal.   Types of Moisturizers(internal use)  . Vitamin E vaginal suppositories- Whole foods, Amazon . Moist Again . Coconut oil- can break down condoms . Julva- (Do no use if on Tamoxifen) amazon . Yes moisturizer- amazon . NeuEve Silk , NeuEve Silver for menopausal or over 65 (if have severe vaginal atrophy or cancer treatments use NeuEve Silk for  1 month than move to NeuEve Silver)- Amazon, Neuve.com . Olive and Bee intimate cream- www.oliveandbee.com.au . Mae vaginal moisturizer- Amazon . Aloe .    Creams to use externally on the Vulva area  Desert Harvest Releveum (good for for cancer patients that had radiation to the area)- amazon or www.desertharvest.com  V-magic cream - amazon  Julva-amazon  Vital "V Wild Yam salve ( help moisturize and help with thinning vulvar area, does have Beeswax  MoodMaid Botanical Pro-Meno Wild Yam Cream- Amazon  Desert Harvest Gele  Cleo by Damiva labial moisturizer (Amazon,   Coconut or olive oil  aloe   Things to avoid in the vaginal area . Do not use things to irritate the vulvar area . No lotions just specialized creams for the vulva area- Neogyn, V-magic, No soaps; can use Aveeno or Calendula cleanser if needed. Must be gentle . No deodorants . No douches . Good to sleep without underwear to let the vaginal area to air out . No scrubbing: spread the lips to let warm water rinse over labias and pat dry  [facility] [address], [address] , [place] [address] Phone # [phone] Fax [phone]  

## 2021-01-17 NOTE — Therapy (Addendum)
[facility] [address] [place], [place], [address] Phone: [phone]   Fax:  [phone]  Physical Therapy Evaluation  Patient Details  Name: Gail Sanchez MRN: [id number] Date of Birth: 01-31-1971 Referring Provider (PT): Mosie Lukes, MD   Encounter Date: 01/17/2021   PT End of Session - 01/17/21 0948     Visit Number 1    Date for PT Re-Evaluation 04/11/21    Authorization Type [employer]    PT Start Time 0935    PT Stop Time 7253    PT Time Calculation (min) 40 min    Activity Tolerance Patient tolerated treatment well    Behavior During Therapy [facility] for tasks assessed/performed             Past Medical History:  Diagnosis Date   Allergic state 06/09/2012   Anxiety 06/25/2017   Chicken pox as a child   Fatigue 09/28/2011   Low back pain 05/28/2015   Onychomycosis 05/27/2016   Preventative health care 06/09/2012   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    Subjective Assessment - 01/17/21 0938     Subjective Pt states her hips and back hurts and has had leakage since last delivery.  Back, hips, groin have hurt for the last 1.5 years.  I catch myself walking funny.  I like to jump rope and that causes leakage.  Other than that I do normal activities.  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  Summertime I move more.  Exercises is walking/running, tennis.  Coughing really bad I notice leakage.    Patient Stated Goals be able to jump rope and not have the feeling like I will leak    Currently in Pain? Yes    Pain Score 6     Pain Location Back    Pain Orientation Right;Left;Lower    Pain Descriptors / Indicators Tightness   stiff, unstable   Pain Type Chronic pain    Pain Onset More than a month ago    Pain Frequency Intermittent     Aggravating Factors  not sure    Pain Relieving Factors stretch after working out and at night    Effect of Pain on Daily Activities no    Multiple Pain Sites No                [facility] PT Assessment - 01/17/21 0001       Assessment   Medical Diagnosis R32 (ICD-10-CM) - Urinary incontinence, unspecified type    Referring Provider (PT) Mosie Lukes, MD    Prior Therapy No      Precautions   Precautions None      Balance Screen   Has the patient fallen in the past 6 months No      [place] residence    Living Arrangements Spouse/significant other;Children   3 children     Prior Function   Level of Independence Independent    Vocation Full time employment    Vocation Requirements --   walking, sitting, standing     Cognition   Overall Cognitive Status Within Functional Limits for tasks assessed      Functional Tests   Functional tests Single leg stance  Single Leg Stance   Comments Lt LE less stable around ankle and hip      Posture/Postural Control   Posture/Postural Control Postural limitations    Postural Limitations Decreased lumbar lordosis;Decreased thoracic kyphosis;Left pelvic obliquity      ROM / Strength   AROM / PROM / Strength Strength;PROM      PROM   Overall PROM Comments Lt hip 50% ER      Strength   Overall Strength Comments Lt hip abduction/adduction 4/5      Flexibility   Soft Tissue Assessment /Muscle Length yes    Hamstrings WNL      Palpation   Palpation comment diastasis rectus abdominus 1.5 fingers throughout and 2-3 cm <1 knuckle deep      Special Tests   Other special tests ASLR improved with pelvic compressure and multifidi compress                        Objective measurements completed on examination: See above findings.     Pelvic Floor Special Questions - 01/17/21 0001     Are you Pregnant or attempting pregnancy? No    Prior Pregnancies Yes    Number of  Pregnancies 3    Number of Vaginal Deliveries 3    Any difficulty with labor and deliveries No    Currently Sexually Active Yes    Is this Painful No    Urinary Leakage Yes    How often most of the time no, just certain activities    Pad use yes, when jumping rope or if I have a bad cough    Urinary urgency No    Urinary frequency normal    Fecal incontinence No    Fluid intake 3-4 bottles of water; a couple glasses of juice    Falling out feeling (prolapse) No    Skin Integrity Intact   dryness   Prolapse Anterior Wall   mild   Pelvic Floor Internal Exam pt identity confimred and informed consent given to perform internal assessment    Exam Type Vaginal    Palpation tight iliococcygeus    Strength weak squeeze, no lift    Strength # of reps 1   5 quick   Strength # of seconds 3    Tone low layer 1 and elevated tone in layer 3 muscles              [facility] Adult PT Treatment/Exercise - 01/17/21 0001       Self-Care   Self-Care Other Self-Care Comments    Other Self-Care Comments  educated on moisturizing                    PT Education - 01/17/21 1020     Education Details moisturizers    Person(s) Educated Patient    Methods Explanation;Verbal cues;Handout    Comprehension Verbalized understanding                 PT Long Term Goals - 01/17/21 1122       PT LONG TERM GOAL #1   Title Pt will be ind with advnanced HEP in order to maintian strength and reduce risk of POP when exercising    Time 12    Period Weeks    Status New    Target Date 04/11/21      PT LONG TERM GOAL #2   Title Pt will be able to jump 10 x without leakage  Time 12    Period Weeks    Status New    Target Date 04/11/21      PT LONG TERM GOAL #3   Title Pt will be able to sustain a kegel for at least 20 seconds in order to demonstrate endurance for exercise routine without leakage    Time 12    Period Weeks    Status New    Target Date 04/11/21      PT LONG TERM  GOAL #4   Title Pt will report 50% less low back pain due to improved core strength    Time 12    Period Weeks    Status New    Target Date 04/11/21                    Plan - 01/17/21 1020     Clinical Impression Statement Pt presents to PT due to low back pain and leakage.  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  Pt has a little more ease with ASLR with pelvic and multifidi compress which is also in aligment with needing more core stability.  Pt was wearing abdominal compression and sits in slumped positions.  pt has Lt ilium elevated and Rt more anterior rotation.  No tenderness to palpation throughout pelvic floor but does have tight iliococcygeus bilaterally. Pt does 5 quick flicks and can hold 3 seconds at 2/5MMT fo pelvic floor.  Pt demonstrates some Lt LE weakness and instability.  Pt will benefit from skilled PT to address all mentioned impairments and return to full function without leakage and with greater pain management    Personal Factors and Comorbidities Time since onset of injury/illness/exacerbation;Comorbidity 2    Comorbidities 3 vaginal deliveries; plantar fasciitis    Examination-Activity Limitations Continence    Examination-Participation Restrictions Community Activity    Stability/Clinical Decision Making Stable/Uncomplicated    Clinical Decision Making Low    Rehab Potential Excellent    PT Frequency 1x / week    PT Duration 12 weeks    PT Treatment/Interventions ADLs/Self Care Home Management;Biofeedback;Cryotherapy;Electrical Stimulation;Moist Heat;Therapeutic activities;Therapeutic exercise;Neuromuscular re-education;Patient/family education;Manual techniques;Passive range of motion;Dry needling;Taping    PT Next Visit Plan pelvic floor isolated exercises; basic core and breathing    PT Home Exercise Plan gave moisturizer info    Consulted and Agree with Plan of Care Patient             Patient will  benefit from skilled therapeutic intervention in order to improve the following deficits and impairments:  Pain, Postural dysfunction, Impaired flexibility, Increased fascial restricitons, Decreased strength, Decreased range of motion, Increased muscle spasms  Visit Diagnosis: Muscle weakness (generalized)  Abnormal posture     Problem List Patient Active Problem List   Diagnosis Date Noted   Thrush, oral 12/18/2020   Laryngitis 12/18/2020   Abdominal pain 04/18/2020   Thrombocytopenia ([place]) 08/13/2019   Anxiety 06/25/2017   Palpitation 10/04/2016   Onychomycosis 05/27/2016   Low back pain 05/28/2015   Preventative health care 06/09/2012   Allergy 06/09/2012   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, [place], [place], [address] Phone: [phone]   Fax:  [phone]  Name: Evone Arseneau Burroughs MRN: [id number] Date of Birth:  Oct 23, 1970  PHYSICAL THERAPY DISCHARGE SUMMARY  Visits from Start of Care: 1  Current functional level related to goals / functional outcomes: See above, attended eval only   Remaining deficits: See above   Education / Equipment: Intial HEP   Patient agrees to discharge. Patient goals were not met. Patient is being discharged due to financial reasons.  Gustavus Bryant, PT 02/06/21 12:15 PM

## 2021-01-23 ENCOUNTER — Encounter: Payer: Self-pay | Admitting: Family Medicine

## 2021-01-23 MED ORDER — ALBUTEROL SULFATE HFA 108 (90 BASE) MCG/ACT IN AERS: 2.0000 | INHALATION_SPRAY | Freq: Four times a day (QID) | RESPIRATORY_TRACT | 0 refills | Status: DC | PRN

## 2021-01-24 ENCOUNTER — Other Ambulatory Visit: Payer: Self-pay

## 2021-01-24 MED ORDER — ALBUTEROL SULFATE HFA 108 (90 BASE) MCG/ACT IN AERS: 2.0000 | INHALATION_SPRAY | Freq: Four times a day (QID) | RESPIRATORY_TRACT | 0 refills | Status: DC | PRN

## 2021-01-25 ENCOUNTER — Ambulatory Visit: Payer: BC Managed Care – PPO | Admitting: Allergy & Immunology

## 2021-02-06 ENCOUNTER — Ambulatory Visit: Payer: BC Managed Care – PPO | Admitting: Physical Therapy

## 2021-02-06 ENCOUNTER — Encounter: Payer: Self-pay | Admitting: Family Medicine

## 2021-02-06 MED ORDER — CLOTRIMAZOLE 10 MG MT TROC: 10.0000 mg | Freq: Every day | OROMUCOSAL | 0 refills | Status: DC

## 2021-02-16 ENCOUNTER — Encounter: Payer: Self-pay | Admitting: Family Medicine

## 2021-02-18 ENCOUNTER — Other Ambulatory Visit: Payer: Self-pay | Admitting: Family Medicine

## 2021-02-18 MED ORDER — FLUCONAZOLE 150 MG PO TABS: 150.0000 mg | ORAL_TABLET | ORAL | 1 refills | Status: DC

## 2021-02-23 ENCOUNTER — Encounter: Payer: BC Managed Care – PPO | Admitting: Physical Therapy

## 2021-02-28 ENCOUNTER — Encounter: Payer: BC Managed Care – PPO | Admitting: Physical Therapy

## 2021-03-03 ENCOUNTER — Other Ambulatory Visit: Payer: Self-pay

## 2021-03-03 ENCOUNTER — Encounter: Payer: Self-pay | Admitting: Family Medicine

## 2021-03-03 ENCOUNTER — Ambulatory Visit: Payer: BC Managed Care – PPO | Admitting: Family Medicine

## 2021-03-03 VITALS — BP 104/80 | HR 68 | Temp 98.2°F | Resp 18 | Ht 69.0 in | Wt 141.6 lb

## 2021-03-03 DIAGNOSIS — J4520 Mild intermittent asthma, uncomplicated: Secondary | ICD-10-CM | POA: Diagnosis not present

## 2021-03-03 DIAGNOSIS — R0789 Other chest pain: Secondary | ICD-10-CM

## 2021-03-03 DIAGNOSIS — J4540 Moderate persistent asthma, uncomplicated: Secondary | ICD-10-CM | POA: Insufficient documentation

## 2021-03-03 HISTORY — DX: Mild intermittent asthma, uncomplicated: J45.20

## 2021-03-03 MED ORDER — QVAR REDIHALER 40 MCG/ACT IN AERB: 2.0000 | INHALATION_SPRAY | Freq: Two times a day (BID) | RESPIRATORY_TRACT | 5 refills | Status: DC

## 2021-03-03 NOTE — Assessment & Plan Note (Signed)
Change symbicort to qvar and con't prn albuterol F/u pcp prn

## 2021-03-03 NOTE — Patient Instructions (Signed)
http://www.aaaai.org/conditions-and-treatments/asthma">  Asthma, Adult  Asthma is a long-term (chronic) condition that causes recurrent episodes in which the airways become tight and narrow. The airways are the passages that lead from the nose and mouth down into the lungs. Asthma episodes, also called asthma attacks, can cause coughing, wheezing, shortness of breath, and chest pain. The airways can also fill with mucus. During an attack, it can be difficult to breathe. Asthmaattacks can range from minor to life threatening. Asthma cannot be cured, but medicines and lifestyle changes can help control itand treat acute attacks. What are the causes? This condition is believed to be caused by inherited (genetic) and environmental factors, but its exact cause is not known. There are many things that can bring on an asthma attack or make asthma symptoms worse (triggers). Asthma triggers are different for each person. Common triggers include: Mold. Dust. Cigarette smoke. Cockroaches. Things that can cause allergy symptoms (allergens), such as animal dander or pollen from trees or grass. Air pollutants such as household cleaners, wood smoke, smog, or chemical odors. Cold air, weather changes, and winds (which increase molds and pollen in the air). Strong emotional expressions such as crying or laughing hard. Stress. Certain medicines (such as aspirin) or types of medicines (such as beta-blockers). Sulfites in foods and drinks. Foods and drinks that may contain sulfites include dried fruit, potato chips, and sparkling grape juice. Infections or inflammatory conditions such as the flu, a cold, or inflammation of the nasal membranes (rhinitis). Gastroesophageal reflux disease (GERD). Exercise or strenuous activity. What are the signs or symptoms? Symptoms of this condition may occur right after asthma is triggered or many hours later. Symptoms include: Wheezing. This can sound like whistling when you  breathe. Excessive nighttime or early morning coughing. Frequent or severe coughing with a common cold. Chest tightness. Shortness of breath. Tiredness (fatigue) with minimal activity. How is this diagnosed? This condition is diagnosed based on: Your medical history. A physical exam. Tests, which may include: Lung function studies and pulmonary studies (spirometry). These tests can evaluate the flow of air in your lungs. Allergy tests. Imaging tests, such as X-rays. How is this treated? There is no cure for this condition, but treatment can help control your symptoms. Treatment for asthma usually involves: Identifying and avoiding your asthma triggers. Using medicines to control your symptoms. Generally, two types of medicines are used to treat asthma: Controller medicines. These help prevent asthma symptoms from occurring. They are usually taken every day. Fast-acting reliever or rescue medicines. These quickly relieve asthma symptoms by widening the narrow and tight airways. They are used as needed and provide short-term relief. Using supplemental oxygen. This may be needed during a severe episode. Using other medicines, such as: Allergy medicines, such as antihistamines, if your asthma attacks are triggered by allergens. Immune medicines (immunomodulators). These are medicines that help control the immune system. Creating an asthma action plan. An asthma action plan is a written plan for managing and treating your asthma attacks. This plan includes: A list of your asthma triggers and how to avoid them. Information about when medicines should be taken and when their dosage should be changed. Instructions about using a device called a peak flow meter. A peak flow meter measures how well the lungs are working and the severity of your asthma. It helps you monitor your condition. Follow these instructions at home: Controlling your home environment Control your home environment in the  following ways to help avoid triggers and prevent asthma attacks: Change your   heating and air conditioning filter regularly. Limit your use of fireplaces and wood stoves. Get rid of pests (such as roaches and mice) and their droppings. Throw away plants if you see mold on them. Clean floors and dust surfaces regularly. Use unscented cleaning products. Try to have someone else vacuum for you regularly. Stay out of rooms while they are being vacuumed and for a short while afterward. If you vacuum, use a dust mask from a hardware store, a double-layered or microfilter vacuum cleaner bag, or a vacuum cleaner with a HEPA filter. Replace carpet with wood, tile, or vinyl flooring. Carpet can trap dander and dust. Use allergy-proof pillows, mattress covers, and box spring covers. Keep your bedroom a trigger-free room. Avoid pets and keep windows closed when allergens are in the air. Wash beddings every week in hot water and dry them in a dryer. Use blankets that are made of polyester or cotton. Clean bathrooms and kitchens with bleach. If possible, have someone repaint the walls in these rooms with mold-resistant paint. Stay out of the rooms that are being cleaned and painted. Wash your hands often with soap and water. If soap and water are not available, use hand sanitizer. Do not allow anyone to smoke in your home. General instructions Take over-the-counter and prescription medicines only as told by your health care provider. Speak with your health care provider if you have questions about how or when to take the medicines. Make note if you are requiring more frequent dosages. Do not use any products that contain nicotine or tobacco, such as cigarettes and e-cigarettes. If you need help quitting, ask your health care provider. Also, avoid being exposed to secondhand smoke. Use a peak flow meter as told by your health care provider. Record and keep track of the readings. Understand and use the asthma  action plan to help minimize, or stop an asthma attack, without needing to seek medical care. Make sure you stay up to date on your yearly vaccinations as told by your health care provider. This may include vaccines for the flu and pneumonia. Avoid outdoor activities when allergen counts are high and when air quality is low. Wear a ski mask that covers your nose and mouth during outdoor winter activities. Exercise indoors on cold days if you can. Warm up before exercising, and take time for a cool-down period after exercise. Keep all follow-up visits as told by your health care provider. This is important. Where to find more information For information about asthma, turn to the Centers for Disease Control and Prevention at www.cdc.gov/asthma/faqs For air quality information, turn to AirNow at airnow.gov Contact a health care provider if: You have wheezing, shortness of breath, or a cough even while you are taking medicine to prevent attacks. The mucus you cough up (sputum) is thicker than usual. Your sputum changes from clear or white to yellow, green, gray, or bloody. Your medicines are causing side effects, such as a rash, itching, swelling, or trouble breathing. You need to use a reliever medicine more than 2-3 times a week. Your peak flow reading is still at 50-79% of your personal best after following your action plan for 1 hour. You have a fever. Get help right away if: You are getting worse and do not respond to treatment during an asthma attack. You are short of breath when at rest or when doing very little physical activity. You have difficulty eating, drinking, or talking. You have chest pain or tightness. You develop a fast heartbeat   or palpitations. You have a bluish color to your lips or fingernails. You are light-headed or dizzy, or you faint. Your peak flow reading is less than 50% of your personal best. You feel too tired to breathe normally. Summary Asthma is a long-term  (chronic) condition that causes recurrent episodes in which the airways become tight and narrow. These episodes can cause coughing, wheezing, shortness of breath, and chest pain. Asthma cannot be cured, but medicines and lifestyle changes can help control it and treat acute attacks. Make sure you understand how to avoid triggers and how and when to use your medicines. Asthma attacks can range from minor to life threatening. Get help right away if you have an asthma attack and do not respond to treatment with your usual rescue medicines. This information is not intended to replace advice given to you by your health care provider. Make sure you discuss any questions you have with your healthcare provider. Document Revised: 03/25/2020 Document Reviewed: 10/28/2019 Elsevier Patient Education  2022 [employer]  

## 2021-03-03 NOTE — Assessment & Plan Note (Signed)
Only with symbicory D/c symbicort Try qvar  F/u Pcp

## 2021-03-03 NOTE — Progress Notes (Signed)
Subjective:   By signing my name below, I, Zite Okoli, attest that this documentation has been prepared under the direction and in the presence of Donato SchultzYvonne R Lowne Chase, DO. 03/03/2021   Patient ID: Gail Sanchez, female    DOB: 18-Sep-1970, 50 y.o.   MRN: [id number]  Chief Complaint  Patient presents with   Flank Pain    Left side mostly under the left breast. Pt states pain with breathing in. No SOB    HPI Patient is in today for an office visit.  She reports that while cooking on Sunday, she experienced pain on her left side under her ribs while breathing. However, she noticed that the pain often comes on after using symbicort. She denies shortness of breath but still experiences the pain when she bends down. She also experiences palpitations after she uses the symbicort inhaler.  She was using albuterol but recently switched to nasacort daily and symbicort 2 pumps daily to manage her asthma and allergies. She started using this medication after she had Covid-19 in the end of January. She had a persistent cough that lasted till the end of May. However, she mentions she still has a productive cough occasionally.   She has non-allergic rhinitis but mentions it became worse after having Covid-19.  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   Vaginitis 12/27/2011    Past Surgical History:  Procedure Laterality Date   cervix frozen      Family History  Problem Relation Age of Onset   Cancer Mother        metastatic   Eczema Mother    Hypertension Father    Blindness Father    Glaucoma Father    Retinal detachment  Father    Asthma Brother    Dementia Paternal Grandmother    COPD Paternal Grandfather    Allergic rhinitis Neg Hx    Angioedema Neg Hx    Immunodeficiency Neg Hx    Urticaria Neg Hx     Social History   Socioeconomic History   Marital status: Married    Spouse name: Not on file   Number of children: Not on file   Years of education: Not on file   Highest education level: Not on file  Occupational History   Occupation: Works with school   Tobacco Use   Smoking status: Never   Smokeless tobacco: Never  Vaping Use   Vaping Use: Never used  Substance and Sexual Activity   Alcohol use: Yes    Comment: occ   Drug use: No   Sexual activity: Yes    Birth control/protection: Other-see comments    Comment: husband with vasectomy  Other Topics Concern   Not on file  Social History Narrative   Not on file   Social Determinants of Health   Financial Resource Strain: Not on file  Food Insecurity: Not on file  Transportation Needs: Not on file  Physical Activity: Not on file  Stress: Not on file  Social Connections: Not on file  Intimate Partner Violence: Not on file    Outpatient Medications Prior to Visit  Medication Sig Dispense Refill   albuterol (VENTOLIN HFA) 108 (90 Base) MCG/ACT inhaler Inhale 2 puffs into the lungs every 6 (six) hours as needed for wheezing or shortness of breath. 18 g 0   clotrimazole (MYCELEX) 10 MG troche Take 1 tablet (10 mg total) by mouth 5 (five) times daily. Use for up to 10 days 50 Troche 0   desloratadine (CLARINEX) 5 MG tablet Take 1 tablet (5 mg total) by mouth 2 (two) times daily as needed. 180 tablet 1   fluconazole (DIFLUCAN) 150 MG tablet Take 1 tablet (150 mg total) by mouth once a week. 2 tablet 1   fluticasone (FLONASE) 50 MCG/ACT nasal spray Place 1-2 sprays into both nostrils daily. 48 mL 1   methocarbamol (ROBAXIN) 500 MG tablet Take 1 tablet (500 mg total) by mouth every 6 (six) hours as needed for muscle spasms. 30 tablet 1    Multiple Vitamin (MULTIVITAMIN) tablet Take 1 tablet by mouth daily.     nystatin (MYCOSTATIN) 100000 UNIT/ML suspension Take 5 mLs (500,000 Units total) by mouth 4 (four) times daily. 473 mL 1   No facility-administered medications prior to visit.    No Known Allergies  Review of Systems  Constitutional:  Negative for fever and malaise/fatigue.  HENT:  Negative for congestion.   Eyes:  Negative for blurred vision.  Respiratory:  Positive for cough. Negative for shortness of breath.   Cardiovascular:  Negative for chest pain, palpitations and leg swelling.  Gastrointestinal:  Negative for abdominal pain, blood in stool and nausea.  Genitourinary:  Negative for dysuria and frequency.  Musculoskeletal:  Negative for falls.  Skin:  Negative for rash.  Neurological:  Negative for dizziness, loss of consciousness and headaches.  Endo/Heme/Allergies:  Negative for environmental allergies.  Psychiatric/Behavioral:  Negative for depression. The patient is not nervous/anxious.       Objective:    Physical Exam Vitals and nursing note reviewed.  Constitutional:      General: She is not in acute distress.    Appearance: Normal appearance. She is well-developed. She is not ill-appearing.  HENT:     Head: Normocephalic and atraumatic.     Right Ear: External ear normal.     Left Ear: External ear normal.  Eyes:     Extraocular Movements: Extraocular movements intact.     Conjunctiva/sclera: Conjunctivae normal.     Pupils: Pupils are equal, round, and reactive to light.  Neck:     Thyroid: No thyromegaly.     Vascular: No carotid bruit or JVD.  Cardiovascular:     Rate and Rhythm: Normal rate and regular rhythm.     Pulses: Normal pulses.     Heart sounds: Normal heart sounds. No murmur heard.   No gallop.  Pulmonary:     Effort: Pulmonary effort is normal. No respiratory distress.     Breath sounds: Normal breath sounds. No wheezing, rhonchi or rales.  Chest:     Chest wall: No  tenderness.  Abdominal:     General: Bowel sounds are normal. There is no distension.     Palpations: Abdomen is soft. There is no mass.     Tenderness: There is no abdominal tenderness. There is no guarding or rebound.     Hernia: No hernia is present.  Musculoskeletal:     Cervical back: Normal range of motion and neck supple.  Lymphadenopathy:     Cervical: No cervical adenopathy.  Skin:    General: Skin is warm and dry.  Neurological:     Mental Status: She is alert and oriented to person, place, and time.  Psychiatric:        Behavior: Behavior normal.    BP 104/80 (BP Location: Right Arm, Patient Position: Sitting, Cuff Size: Normal)   Pulse 68   Temp 98.2 F (36.8 C) (Oral)   Resp 18   Ht 5\' 9"  (1.753 m)   Wt 141 lb 9.6 oz (64.2 kg)   SpO2 99%   BMI 20.91 kg/m  Wt Readings from Last 3 Encounters:  03/03/21 141 lb 9.6 oz (64.2 kg)  12/15/20 144 lb (65.3 kg)  09/08/20 143 lb 9.6 oz (65.1 kg)   Ekg---sinus rhythm  Diabetic Foot Exam - Simple   No data filed    Lab Results  Component Value Date   WBC 6.8 09/08/2020   HGB 13.1 09/08/2020   HCT 38.4 09/08/2020   PLT 139.0 (L) 09/08/2020   GLUCOSE 76 09/08/2020   CHOL 158 09/08/2020   TRIG 32.0 09/08/2020   HDL 77.40 09/08/2020   LDLCALC 74 09/08/2020   ALT 9 09/08/2020   AST 15 09/08/2020   NA 140 09/08/2020   K 3.8 09/08/2020   CL 102 09/08/2020   CREATININE 0.93 09/08/2020   BUN 9 09/08/2020   CO2 30 09/08/2020   TSH 1.31 09/08/2020    Lab Results  Component Value Date   TSH 1.31 09/08/2020   Lab Results  Component Value Date   WBC 6.8 09/08/2020   HGB 13.1 09/08/2020   HCT 38.4 09/08/2020   MCV 89.4 09/08/2020   PLT 139.0 (L) 09/08/2020   Lab Results  Component Value Date   NA 140 09/08/2020   K 3.8 09/08/2020   CO2 30 09/08/2020   GLUCOSE 76 09/08/2020   BUN 9 09/08/2020   CREATININE 0.93 09/08/2020   BILITOT 0.6 09/08/2020   ALKPHOS 56 09/08/2020   AST 15 09/08/2020   ALT 9  09/08/2020   PROT 7.3 09/08/2020   ALBUMIN 4.2 09/08/2020   CALCIUM 9.8 09/08/2020   GFR 72.36 09/08/2020   Lab Results  Component Value Date   CHOL 158 09/08/2020   Lab Results  Component Value Date   HDL 77.40 09/08/2020   Lab Results  Component Value Date   LDLCALC 74 09/08/2020   Lab Results  Component Value Date   TRIG 32.0 09/08/2020   Lab Results  Component Value Date   CHOLHDL 2 09/08/2020   No results found for: HGBA1C     Assessment & Plan:   Problem List Items Addressed This Visit       Unprioritized   Atypical chest pain - Primary    Only with symbicory D/c symbicort Try qvar  F/u Pcp      Relevant Orders   EKG 12-Lead (Completed)   Mild intermittent asthma without complication    Change symbicort to qvar and con't prn albuterol F/u pcp prn       Relevant Medications   beclomethasone (QVAR REDIHALER) 40 MCG/ACT inhaler     Meds ordered this encounter  Medications   beclomethasone (QVAR REDIHALER) 40 MCG/ACT inhaler    Sig: Inhale 2 puffs into the lungs 2 (two) times daily.    Dispense:  1 each    Refill:  5    I,Zite Okoli,acting as a scribe for 11/08/2020, DO.,have documented all relevant documentation on the behalf of [employer], DO,as directed by  Donato Schultz, DO while  in the presence of Donato Schultz, DO.   I, Donato Schultz, DO., personally preformed the services described in this documentation.  All medical record entries made by the scribe were at my direction and in my presence.  I have reviewed the chart and discharge instructions (if applicable) and agree that the record reflects my personal performance and is accurate and complete. 03/03/2021

## 2021-03-06 ENCOUNTER — Encounter: Payer: BC Managed Care – PPO | Admitting: Physical Therapy

## 2021-03-15 ENCOUNTER — Encounter: Payer: BC Managed Care – PPO | Admitting: Physical Therapy

## 2021-03-21 ENCOUNTER — Encounter: Payer: BC Managed Care – PPO | Admitting: Physical Therapy

## 2021-03-28 ENCOUNTER — Ambulatory Visit: Payer: BC Managed Care – PPO | Admitting: Family Medicine

## 2021-03-28 ENCOUNTER — Encounter: Payer: Self-pay | Admitting: Family Medicine

## 2021-03-28 ENCOUNTER — Other Ambulatory Visit: Payer: Self-pay | Admitting: Family Medicine

## 2021-03-28 ENCOUNTER — Ambulatory Visit (HOSPITAL_BASED_OUTPATIENT_CLINIC_OR_DEPARTMENT_OTHER)
Admission: RE | Admit: 2021-03-28 | Discharge: 2021-03-28 | Disposition: A | Discharge status: Home / Self Care | Payer: BC Managed Care – PPO | Source: Ambulatory Visit | Attending: Family Medicine | Admitting: Family Medicine

## 2021-03-28 ENCOUNTER — Other Ambulatory Visit: Payer: Self-pay

## 2021-03-28 VITALS — BP 108/74 | HR 91 | Temp 97.5°F | Resp 16 | Wt 139.6 lb

## 2021-03-28 DIAGNOSIS — Z23 Encounter for immunization: Secondary | ICD-10-CM

## 2021-03-28 DIAGNOSIS — Z1211 Encounter for screening for malignant neoplasm of colon: Secondary | ICD-10-CM

## 2021-03-28 DIAGNOSIS — J4520 Mild intermittent asthma, uncomplicated: Secondary | ICD-10-CM

## 2021-03-28 DIAGNOSIS — Z09 Encounter for follow-up examination after completed treatment for conditions other than malignant neoplasm: Secondary | ICD-10-CM

## 2021-03-28 DIAGNOSIS — R059 Cough, unspecified: Secondary | ICD-10-CM | POA: Insufficient documentation

## 2021-03-28 DIAGNOSIS — U071 COVID-19: Secondary | ICD-10-CM | POA: Diagnosis not present

## 2021-03-28 DIAGNOSIS — Z020 Encounter for examination for admission to educational institution: Secondary | ICD-10-CM | POA: Diagnosis not present

## 2021-03-28 LAB — CBC WITH DIFFERENTIAL/PLATELET
Basophils Absolute: 0 10*3/uL (ref 0.0–0.1)
Basophils Relative: 0.5 % (ref 0.0–3.0)
Eosinophils Absolute: 0.2 10*3/uL (ref 0.0–0.7)
Eosinophils Relative: 3.9 % (ref 0.0–5.0)
HCT: 39 % (ref 36.0–46.0)
Hemoglobin: 13.1 g/dL (ref 12.0–15.0)
Lymphocytes Relative: 16.9 % (ref 12.0–46.0)
Lymphs Abs: 0.7 10*3/uL (ref 0.7–4.0)
MCHC: 33.6 g/dL (ref 30.0–36.0)
MCV: 90.2 fl (ref 78.0–100.0)
Monocytes Absolute: 0.4 10*3/uL (ref 0.1–1.0)
Monocytes Relative: 9.6 % (ref 3.0–12.0)
Neutro Abs: 2.9 10*3/uL (ref 1.4–7.7)
Neutrophils Relative %: 69.1 % (ref 43.0–77.0)
Platelets: 120 10*3/uL — ABNORMAL LOW (ref 150.0–400.0)
RBC: 4.32 Mil/uL (ref 3.87–5.11)
RDW: 12.2 % (ref 11.5–15.5)
WBC: 4.1 10*3/uL (ref 4.0–10.5)

## 2021-03-28 MED ORDER — BUDESONIDE-FORMOTEROL FUMARATE 80-4.5 MCG/ACT IN AERO: 2.0000 | INHALATION_SPRAY | Freq: Two times a day (BID) | RESPIRATORY_TRACT | 3 refills | Status: DC

## 2021-03-28 MED ORDER — AZITHROMYCIN 250 MG PO TABS: ORAL_TABLET | ORAL | 0 refills | Status: AC

## 2021-03-28 NOTE — Patient Instructions (Signed)

## 2021-03-28 NOTE — Progress Notes (Signed)
Patient ID: Gail Sanchez, female    DOB: 1970-10-05  Age: 50 y.o. MRN: [id number]    Subjective:   Chief Complaint  Patient presents with   3 months follow up    Pt would like to discuss inhaler   Subjective   HPI Gail Sanchez presents for office visit today for follow up on asthma and laryngitis. 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 Denies palp/diaphoresis/SOB/HA/fevers/GI or GU c/o. Taking meds as prescribed.  She was worried hat her qvar inhaler caused her CP and as a result she stopped taking it. However, she is still experiencing CP at the moment even after stopping qvar. She states that she did not get any symptoms of CP when she was on Symbicort. She describes the pain as sharp with breathing, but denies feeling any pressure. Sitting on chest or laying on it does not cause her pain. She is experiencing cold symptoms of stuffy and runny nose with light yellow discharge when coughing. She states that her excessive coughing is contributing to her CP. She did not get any recent covid tests.   Review of Systems  Constitutional:  Negative for chills, diaphoresis, fatigue and fever.  HENT:  Positive for postnasal drip. Negative for congestion, rhinorrhea, sinus pressure, sinus pain and sore throat.   Eyes:  Negative for pain.  Respiratory:  Positive for cough. Negative for shortness of breath.   Cardiovascular:  Positive for chest pain. Negative for palpitations and leg swelling.  Gastrointestinal:  Negative for abdominal pain, blood in stool, diarrhea, nausea and vomiting.  Genitourinary:  Negative for decreased urine volume, flank pain, frequency, vaginal bleeding and vaginal discharge.  Musculoskeletal:  Negative for back pain.  Neurological:  Negative for headaches.   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   Her family history includes Asthma in her brother; Blindness in her father; COPD in her paternal grandfather; Cancer in her mother; Dementia in her paternal grandmother; Eczema in her mother; Glaucoma in her father; Hypertension in her father; Retinal detachment in her father.She reports that she has never smoked. She has never used smokeless tobacco. She reports current alcohol use. She reports that she does not use drugs.  Current Outpatient Medications on File Prior to Visit  Medication Sig Dispense Refill   albuterol (VENTOLIN HFA) 108 (90 Base) MCG/ACT inhaler Inhale 2 puffs into the lungs every 6 (six) hours as needed for wheezing or shortness of breath. 18 g 0   desloratadine (CLARINEX) 5 MG tablet Take 1 tablet (5 mg total) by mouth 2 (two) times daily as needed. 180 tablet 1   fluticasone (FLONASE) 50 MCG/ACT nasal spray Place 1-2 sprays into both nostrils daily. 48 mL 1   methocarbamol (ROBAXIN) 500 MG tablet Take 1 tablet (500 mg total) by mouth every 6 (six) hours as needed for muscle spasms. 30 tablet 1   Multiple Vitamin (MULTIVITAMIN) tablet Take 1 tablet by mouth daily.     No current facility-administered medications  on file prior to visit.     Objective:  Objective  Physical Exam Constitutional:      General: She is not in acute distress.    Appearance: Normal appearance. She is not ill-appearing or toxic-appearing.  HENT:     Head: Normocephalic and atraumatic.     Right Ear: Tympanic membrane, ear canal and external ear normal.     Left Ear: Tympanic membrane, ear canal and external ear normal.     Nose: No congestion or rhinorrhea.  Eyes:      Extraocular Movements: Extraocular movements intact.     Pupils: Pupils are equal, round, and reactive to light.  Cardiovascular:     Rate and Rhythm: Normal rate and regular rhythm.     Pulses: Normal pulses.     Heart sounds: Normal heart sounds. No murmur heard. Pulmonary:     Effort: Pulmonary effort is normal. No respiratory distress.     Breath sounds: Normal breath sounds. No wheezing, rhonchi or rales.  Abdominal:     General: Bowel sounds are normal.     Palpations: Abdomen is soft. There is no mass.     Tenderness: There is no abdominal tenderness. There is no guarding.     Hernia: No hernia is present.  Musculoskeletal:        General: Normal range of motion.     Cervical back: Normal range of motion and neck supple.  Skin:    General: Skin is warm and dry.  Neurological:     Mental Status: She is alert and oriented to person, place, and time.  Psychiatric:        Behavior: Behavior normal.   BP 108/74   Pulse 91   Temp (!) 97.5 F (36.4 C)   Resp 16   Wt 139 lb 9.6 oz (63.3 kg)   SpO2 99%   BMI 20.62 kg/m  Wt Readings from Last 3 Encounters:  03/28/21 139 lb 9.6 oz (63.3 kg)  03/03/21 141 lb 9.6 oz (64.2 kg)  12/15/20 144 lb (65.3 kg)     Lab Results  Component Value Date   WBC 4.1 03/28/2021   HGB 13.1 03/28/2021   HCT 39.0 03/28/2021   PLT 120.0 (L) 03/28/2021   GLUCOSE 76 09/08/2020   CHOL 158 09/08/2020   TRIG 32.0 09/08/2020   HDL 77.40 09/08/2020   LDLCALC 74 09/08/2020   ALT 9 09/08/2020   AST 15 09/08/2020   NA 140 09/08/2020   K 3.8 09/08/2020   CL 102 09/08/2020   CREATININE 0.93 09/08/2020   BUN 9 09/08/2020   CO2 30 09/08/2020   TSH 1.31 09/08/2020    DG Chest 2 View  Result Date: 12/18/2020 CLINICAL DATA:  Cough and shortness of breath. EXAM: CHEST - 2 VIEW COMPARISON:  None. FINDINGS: The heart size and mediastinal contours are within normal limits. Both lungs are clear. The visualized skeletal structures are unremarkable.  IMPRESSION: No active cardiopulmonary disease. Electronically Signed   By: Gerome Sam III M.D   On: 12/18/2020 17:25     Assessment & Plan:  Plan    Meds ordered this encounter  Medications   budesonide-formoterol (SYMBICORT) 80-4.5 MCG/ACT inhaler    Sig: Inhale 2 puffs into the lungs 2 (two) times daily.    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 Her form is reviewed but she does not have a copy of her previous immunizations. She will check [employer] on MMRV and she is given Tdap today and she will need the next shot in 1 month and the final shot 6 months after next shot. No other shots are required by the school.       Cough    covid test in office negative. She has head congestion and yellow rhinorrhea. She notes chest pain with deep breathing and cough. CXR negative but patients symptoms continue to worsen. Will start Zpak and Encouraged increased rest and hydration, add probiotics, zinc such as Coldeze or Xicam. Treat fevers as needed. Given zpak      Relevant Orders   DG Chest 2 View (Completed)   CBC w/Diff (Completed)   Other Visit Diagnoses     Need for vaccination    -  Primary   Relevant Orders   Measles/Mumps/Rubella Immunity (Completed)   Varicella zoster antibody, IgG (Completed)   Encounter for school history and physical examination       Relevant Orders   Measles/Mumps/Rubella Immunity (Completed)   Varicella zoster antibody, IgG (Completed)   Colon cancer screening       Relevant Orders   Ambulatory referral to Gastroenterology   Need for influenza vaccination       Relevant Orders   Flu Vaccine  QUAD 36+ mos IM (Fluarix, Fluzone & Afluria Quad PF (Completed)   Need for Tdap vaccination       Relevant Orders   Tdap vaccine greater than or equal to 7yo IM (Completed)   COVID       Relevant Orders   Novel Coronavirus, NAA ([facility]) (Completed)       Follow-up: Return in about 11 weeks (around 06/13/2021).  I, Billie Lade, acting as a scribe for Danise Edge, MD, have documented all relevent documentation on behalf of Danise Edge, MD, as directed by Danise Edge, MD while in the presence of Danise Edge, MD. DO:03/30/21.    I, Bradd Canary, MD personally performed the services described in this documentation. All medical record entries made by the scribe were at my direction and in my presence. I have reviewed the chart and agree that the record reflects my personal performance and is accurate and complete

## 2021-03-29 ENCOUNTER — Other Ambulatory Visit: Payer: Self-pay | Admitting: Family Medicine

## 2021-03-29 LAB — NOVEL CORONAVIRUS, NAA: SARS-CoV-2, NAA: NOT DETECTED

## 2021-03-29 LAB — MEASLES/MUMPS/RUBELLA IMMUNITY
Mumps IgG: <9 AU/mL — ABNORMAL LOW
Rubella: 1.09 Index
Rubeola IgG: 56 AU/mL

## 2021-03-29 LAB — SARS-COV-2, NAA 2 DAY TAT

## 2021-03-29 LAB — VARICELLA ZOSTER ANTIBODY, IGG: Varicella IgG: 647.1 index

## 2021-03-30 DIAGNOSIS — R059 Cough, unspecified: Secondary | ICD-10-CM | POA: Insufficient documentation

## 2021-03-30 DIAGNOSIS — Z09 Encounter for follow-up examination after completed treatment for conditions other than malignant neoplasm: Secondary | ICD-10-CM | POA: Insufficient documentation

## 2021-03-30 NOTE — Assessment & Plan Note (Addendum)
covid test in office negative. She has head congestion and yellow rhinorrhea. She notes chest pain with deep breathing and cough. CXR negative but patients symptoms continue to worsen. Will start Zpak and Encouraged increased rest and hydration, add probiotics, zinc such as Coldeze or Xicam. Treat fevers as needed. Given zpak

## 2021-03-30 NOTE — Assessment & Plan Note (Signed)
Symbicort 80/4.5 to use bid, albuterol prn

## 2021-03-30 NOTE — Assessment & Plan Note (Signed)
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 Her form is reviewed but she does not have a copy of her previous immunizations. She will check [employer] on MMRV and she is given Tdap today and she will need the next shot in 1 month and the final shot 6 months after next shot. No other shots are required by the school.

## 2021-03-31 ENCOUNTER — Telehealth: Payer: Self-pay | Admitting: *Deleted

## 2021-03-31 NOTE — Telephone Encounter (Signed)
Per Dr. Abner Greenspan patient will need 2nd in 1 month (on or around 04/27/21) and 3rd tdap in 6 months after 2nd (on or around 10/26/20).  Patient scheduled for both.

## 2021-03-31 NOTE — Telephone Encounter (Signed)
The patient wanted to know if she will need MMR booster?  Component Ref Range & Units 3 d ago   Rubeola IgG AU/mL 56.00   Comment: AU/mL            Interpretation  -----            --------------  <13.50           Not consistent with immunity  13.50-16.49      Equivocal  >16.49           Consistent with immunity  .  The presence of measles IgG suggests immunization or  past or current infection with measles virus.  .  For additional information, please refer to  http://education.[facility].com/faq/FAQ162  (This link is being provided for informational/  educational purposes only.)  .   Mumps IgG AU/mL <9.00 Low    Comment:  AU/mL           Interpretation  -------         ----------------  <9.00             Not consistent with immunity  9.00-10.99        Equivocal  >10.99            Consistent with immunity  .  The presence of mumps IgG antibody suggests immunization  or past or current infection with mumps virus.  .   Rubella Index 1.09   Comment:     Index            Interpretation      -----            --------------        <0.90            Not consistent with immunity      0.90-0.99        Equivocal      > or = 1.00      Consistent with immunity  .

## 2021-04-04 ENCOUNTER — Encounter: Payer: Self-pay | Admitting: *Deleted

## 2021-04-04 NOTE — Telephone Encounter (Signed)
Spoke with patient and advised that she will need MMR vaccine as well.  She will get at the same time as 2nd tdap.

## 2021-04-28 ENCOUNTER — Other Ambulatory Visit: Payer: Self-pay

## 2021-04-28 ENCOUNTER — Ambulatory Visit: Payer: BC Managed Care – PPO

## 2021-05-08 ENCOUNTER — Telehealth: Payer: Self-pay | Admitting: Family Medicine

## 2021-05-08 ENCOUNTER — Encounter: Payer: Self-pay | Admitting: Family Medicine

## 2021-05-10 ENCOUNTER — Ambulatory Visit (INDEPENDENT_AMBULATORY_CARE_PROVIDER_SITE_OTHER): Payer: BC Managed Care – PPO

## 2021-05-10 ENCOUNTER — Other Ambulatory Visit: Payer: Self-pay

## 2021-05-10 DIAGNOSIS — Z23 Encounter for immunization: Secondary | ICD-10-CM | POA: Diagnosis not present

## 2021-05-10 NOTE — Progress Notes (Signed)
Pt here for 2nd Tdap and MMR. Both vaccines given in left arm. Pt handled well. 3rd and final Tdap scheduled for 6 months out.

## 2021-05-18 NOTE — Telephone Encounter (Signed)
error 

## 2021-06-15 ENCOUNTER — Ambulatory Visit (HOSPITAL_BASED_OUTPATIENT_CLINIC_OR_DEPARTMENT_OTHER)
Admission: RE | Admit: 2021-06-15 | Discharge: 2021-06-15 | Disposition: A | Discharge status: Home / Self Care | Payer: BC Managed Care – PPO | Source: Ambulatory Visit | Attending: Family Medicine | Admitting: Family Medicine

## 2021-06-15 ENCOUNTER — Ambulatory Visit: Payer: BC Managed Care – PPO | Admitting: Family Medicine

## 2021-06-15 ENCOUNTER — Encounter: Payer: Self-pay | Admitting: Family Medicine

## 2021-06-15 ENCOUNTER — Other Ambulatory Visit: Payer: Self-pay

## 2021-06-15 VITALS — BP 110/70 | HR 75 | Temp 97.8°F | Resp 16 | Ht 68.0 in | Wt 143.4 lb

## 2021-06-15 DIAGNOSIS — R0600 Dyspnea, unspecified: Secondary | ICD-10-CM

## 2021-06-15 DIAGNOSIS — T7840XD Allergy, unspecified, subsequent encounter: Secondary | ICD-10-CM | POA: Diagnosis not present

## 2021-06-15 DIAGNOSIS — R059 Cough, unspecified: Secondary | ICD-10-CM | POA: Diagnosis present

## 2021-06-15 DIAGNOSIS — J4520 Mild intermittent asthma, uncomplicated: Secondary | ICD-10-CM

## 2021-06-15 MED ORDER — SPACER/AERO-HOLDING CHAMBERS DEVI: 0 refills | Status: AC

## 2021-06-15 MED ORDER — AZITHROMYCIN 250 MG PO TABS: ORAL_TABLET | ORAL | 0 refills | Status: AC

## 2021-06-15 NOTE — Progress Notes (Signed)
Patient ID: Gail Sanchez, female    DOB: June 24, 1971  Age: 50 y.o. MRN: [id number]    Subjective:   Chief Complaint  Patient presents with   chest congestion   Subjective   HPI Gail Sanchez presents for office visit today for follow up on chest congestion and wheezing. She reports initially experiencing tooth pain and shortly after had HA's for 3 days: Friday, Saturday, and Sunday. She reports that her HA's have improved, but now she is experiencing coughs, wheezing, and chest congestion. The wheezing have improved at the moment. She takes albuterol but not regularly and takes Symbicort BID. She feels the wheezing and rales in her right side and experiences itching in eyes and throat. She describes her coughs as yellow colored, but recently they have improved. Her covid infection was last February. As a result of current symptoms, she has started taking allegra. Denies CP/palp/SOB/fevers/GI or GU c/o. Taking meds as prescribed.   Review of Systems  Constitutional:  Negative for chills, fatigue and fever.  HENT:  Negative for congestion, rhinorrhea, sinus pressure, sinus pain and sore throat.   Eyes:  Negative for pain.  Respiratory:  Positive for cough and wheezing. Negative for shortness of breath.   Cardiovascular:  Negative for chest pain, palpitations and leg swelling.  Gastrointestinal:  Negative for abdominal pain, blood in stool, diarrhea, nausea and vomiting.  Genitourinary:  Negative for decreased urine volume, flank pain, frequency, vaginal bleeding and vaginal discharge.  Musculoskeletal:  Negative for back pain.  Neurological:  Positive for headaches.   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   Her family history includes Asthma in her brother; Blindness in her father; COPD in her paternal grandfather; Cancer in her mother; Dementia in her paternal grandmother; Eczema in her mother; Glaucoma in her father; Hypertension in her father; Retinal detachment in her father.She reports that she has never smoked. She has never used smokeless tobacco. She reports current alcohol use. She reports that she does not use drugs.  Current Outpatient Medications on File Prior to Visit  Medication Sig Dispense Refill   albuterol (VENTOLIN HFA) 108 (90 Base) MCG/ACT inhaler Inhale 2 puffs into the lungs every 6 (six) hours as needed for wheezing or shortness of breath. 18 g 0   budesonide-formoterol (SYMBICORT) 80-4.5 MCG/ACT inhaler Inhale 2 puffs into the lungs 2 (two) times daily. 1 each 3   desloratadine (CLARINEX) 5 MG tablet Take 1 tablet (5 mg total) by mouth 2 (two) times daily as needed. 180 tablet 1   fluticasone (FLONASE) 50 MCG/ACT nasal spray Place 1-2 sprays into both nostrils daily. 48 mL 1   methocarbamol (ROBAXIN) 500 MG tablet Take 1 tablet (500 mg total) by mouth every 6 (six) hours as needed for muscle spasms. 30 tablet 1   Multiple Vitamin (MULTIVITAMIN) tablet Take 1 tablet by mouth daily.     No current facility-administered medications on file prior to visit.     Objective:  Objective  Physical Exam Constitutional:      General: She is not in acute  distress.    Appearance: Normal appearance. She is not ill-appearing or toxic-appearing.  HENT:     Head: Normocephalic and atraumatic.     Right Ear: Tympanic membrane, ear canal and external ear normal.     Left Ear: Tympanic membrane, ear canal and external ear normal.     Nose: No congestion or rhinorrhea.     Mouth/Throat:     Mouth: Mucous membranes are moist.  Eyes:     Extraocular  Movements: Extraocular movements intact.     Pupils: Pupils are equal, round, and reactive to light.  Cardiovascular:     Rate and Rhythm: Normal rate and regular rhythm.     Pulses: Normal pulses.     Heart sounds: Normal heart sounds. No murmur heard. Pulmonary:     Effort: Pulmonary effort is normal. No respiratory distress.     Breath sounds: Normal breath sounds. No wheezing, rhonchi or rales.     Comments: - Squeak on the right upper/lower lobe  Abdominal:     General: Bowel sounds are normal.     Palpations: Abdomen is soft. There is no mass.     Tenderness: There is no abdominal tenderness. There is no guarding.     Hernia: No hernia is present.  Musculoskeletal:        General: Normal range of motion.     Cervical back: Normal range of motion and neck supple.  Skin:    General: Skin is warm and dry.  Neurological:     Mental Status: She is alert and oriented to person, place, and time.  Psychiatric:        Behavior: Behavior normal.   BP 110/70   Pulse 75   Temp 97.8 F (36.6 C)   Resp 16   Ht 5\' 8"  (1.727 m)   Wt 143 lb 6.4 oz (65 kg)   SpO2 93%   BMI 21.80 kg/m  Wt Readings from Last 3 Encounters:  06/15/21 143 lb 6.4 oz (65 kg)  03/28/21 139 lb 9.6 oz (63.3 kg)  03/03/21 141 lb 9.6 oz (64.2 kg)     Lab Results  Component Value Date   WBC 4.1 03/28/2021   HGB 13.1 03/28/2021   HCT 39.0 03/28/2021   PLT 120.0 (L) 03/28/2021   GLUCOSE 76 09/08/2020   CHOL 158 09/08/2020   TRIG 32.0 09/08/2020   HDL 77.40 09/08/2020   LDLCALC 74 09/08/2020   ALT 9 09/08/2020   AST 15 09/08/2020   NA 140 09/08/2020   K 3.8 09/08/2020   CL 102 09/08/2020   CREATININE 0.93 09/08/2020   BUN 9 09/08/2020   CO2 30 09/08/2020   TSH 1.31 09/08/2020    DG Chest 2 View  Result Date: 03/28/2021 CLINICAL DATA:  50 year old female with history of cough, congestion and chest pain for the past few days. EXAM: CHEST - 2 VIEW COMPARISON:  Chest x-ray 12/15/2020. FINDINGS: Lung  volumes are normal. No consolidative airspace disease. No pleural effusions. No pneumothorax. No pulmonary nodule or mass noted. Pulmonary vasculature and the cardiomediastinal silhouette are within normal limits. IMPRESSION: No radiographic evidence of acute cardiopulmonary disease. Electronically Signed   By: 02/14/2021 M.D.   On: 03/28/2021 14:42     Assessment & Plan:  Plan    Meds ordered this encounter  Medications   azithromycin (ZITHROMAX) 250 MG tablet    Sig: Take 2 tablets on day 1, then 1 tablet daily on days 2 through 5  Dispense:  6 tablet    Refill:  0   Spacer/Aero-Holding Chambers DEVI    Sig: Use as directed with inhaler    Dispense:  1 Device    Refill:  0     Problem List Items Addressed This Visit     Allergy    Has been flared lately she switched from Clarinex to Allegra and she believes that has helped. Use Flonase and nasal saline daily and add Singulair      Mild intermittent asthma without complication    Continue Symbicort and Albuterol and add Singulair. She notes she has been more reactive since Covid earlier this year. If no improvement she may need referral to pulmonology for further consideration      Cough    Increased congestion again and some wheezing. Repeat CXR normal. Given Zpak and Mucinex and she will report if no improvement      Relevant Orders   ECHOCARDIOGRAM COMPLETE   DG Chest 2 View (Completed)   Dyspnea - Primary    Check CXR and echo      Relevant Medications   Spacer/Aero-Holding Rudean Curt   Other Relevant Orders   ECHOCARDIOGRAM COMPLETE   DG Chest 2 View (Completed)    Follow-up: No follow-ups on file.  I, Billie Lade, acting as a scribe for Danise Edge, MD, have documented all relevent documentation on behalf of Danise Edge, MD, as directed by Danise Edge, MD while in the presence of Danise Edge, MD. DO:06/16/21.  I, Bradd Canary, MD personally performed the services described in this  documentation. All medical record entries made by the scribe were at my direction and in my presence. I have reviewed the chart and agree that the record reflects my personal performance and is accurate and complete

## 2021-06-15 NOTE — Patient Instructions (Addendum)

## 2021-06-16 DIAGNOSIS — R0600 Dyspnea, unspecified: Secondary | ICD-10-CM | POA: Insufficient documentation

## 2021-06-16 NOTE — Assessment & Plan Note (Signed)
Increased congestion again and some wheezing. Repeat CXR normal. Given Zpak and Mucinex and she will report if no improvement

## 2021-06-16 NOTE — Assessment & Plan Note (Signed)
Has been flared lately she switched from Clarinex to Salix and she believes that has helped. Use Flonase and nasal saline daily and add Singulair

## 2021-06-16 NOTE — Assessment & Plan Note (Signed)
Check CXR and echo

## 2021-06-16 NOTE — Assessment & Plan Note (Signed)
Continue Symbicort and Albuterol and add Singulair. She notes she has been more reactive since Covid earlier this year. If no improvement she may need referral to pulmonology for further consideration

## 2021-06-24 ENCOUNTER — Encounter: Payer: Self-pay | Admitting: Family Medicine

## 2021-06-26 ENCOUNTER — Other Ambulatory Visit: Payer: Self-pay | Admitting: Family Medicine

## 2021-06-26 MED ORDER — CEFDINIR 300 MG PO CAPS: 300.0000 mg | ORAL_CAPSULE | Freq: Two times a day (BID) | ORAL | 0 refills | Status: AC

## 2021-07-20 ENCOUNTER — Ambulatory Visit: Payer: BC Managed Care – PPO | Admitting: Family Medicine

## 2021-07-25 ENCOUNTER — Other Ambulatory Visit: Payer: Self-pay

## 2021-07-25 ENCOUNTER — Ambulatory Visit (HOSPITAL_BASED_OUTPATIENT_CLINIC_OR_DEPARTMENT_OTHER)
Admission: RE | Admit: 2021-07-25 | Discharge: 2021-07-25 | Disposition: A | Discharge status: Home / Self Care | Payer: BC Managed Care – PPO | Source: Ambulatory Visit | Attending: Family Medicine | Admitting: Family Medicine

## 2021-07-25 DIAGNOSIS — R0600 Dyspnea, unspecified: Secondary | ICD-10-CM | POA: Diagnosis present

## 2021-07-25 DIAGNOSIS — R0609 Other forms of dyspnea: Secondary | ICD-10-CM | POA: Diagnosis not present

## 2021-07-25 DIAGNOSIS — R059 Cough, unspecified: Secondary | ICD-10-CM | POA: Diagnosis present

## 2021-07-25 NOTE — Progress Notes (Signed)
°  Echocardiogram 2D Echocardiogram has been performed.  Gail Sanchez F 07/25/2021, 9:21 AM

## 2021-07-31 ENCOUNTER — Ambulatory Visit: Payer: BC Managed Care – PPO | Admitting: Family Medicine

## 2021-07-31 ENCOUNTER — Encounter: Payer: Self-pay | Admitting: Family Medicine

## 2021-07-31 VITALS — BP 108/70 | HR 87 | Temp 98.0°F | Resp 16 | Ht 68.0 in | Wt 143.0 lb

## 2021-07-31 DIAGNOSIS — R0602 Shortness of breath: Secondary | ICD-10-CM

## 2021-07-31 DIAGNOSIS — R059 Cough, unspecified: Secondary | ICD-10-CM | POA: Diagnosis not present

## 2021-07-31 DIAGNOSIS — J411 Mucopurulent chronic bronchitis: Secondary | ICD-10-CM | POA: Diagnosis not present

## 2021-07-31 DIAGNOSIS — K1370 Unspecified lesions of oral mucosa: Secondary | ICD-10-CM

## 2021-07-31 DIAGNOSIS — J040 Acute laryngitis: Secondary | ICD-10-CM

## 2021-07-31 DIAGNOSIS — J4540 Moderate persistent asthma, uncomplicated: Secondary | ICD-10-CM

## 2021-07-31 DIAGNOSIS — K219 Gastro-esophageal reflux disease without esophagitis: Secondary | ICD-10-CM

## 2021-07-31 MED ORDER — ALBUTEROL SULFATE HFA 108 (90 BASE) MCG/ACT IN AERS: 2.0000 | INHALATION_SPRAY | Freq: Four times a day (QID) | RESPIRATORY_TRACT | 0 refills | Status: DC | PRN

## 2021-07-31 MED ORDER — DOXYCYCLINE HYCLATE 100 MG PO TABS: 100.0000 mg | ORAL_TABLET | Freq: Two times a day (BID) | ORAL | 0 refills | Status: DC

## 2021-07-31 MED ORDER — BUDESONIDE-FORMOTEROL FUMARATE 160-4.5 MCG/ACT IN AERO: 2.0000 | INHALATION_SPRAY | Freq: Two times a day (BID) | RESPIRATORY_TRACT | 3 refills | Status: DC

## 2021-07-31 MED ORDER — METHYLPREDNISOLONE 4 MG PO TABS: ORAL_TABLET | ORAL | 0 refills | Status: DC

## 2021-07-31 MED ORDER — FAMOTIDINE 40 MG PO TABS: 40.0000 mg | ORAL_TABLET | Freq: Every day | ORAL | 2 refills | Status: DC

## 2021-07-31 NOTE — Patient Instructions (Addendum)
NOW company probiotics at [email or web address]  Cough, Adult Coughing is a reflex that clears your throat and your airways (respiratory system). Coughing helps to heal and protect your lungs. It is normal to cough occasionally, but a cough that happens with other symptoms or lasts a long time may be a sign of a condition that needs treatment. An acute cough may only last 2-3 weeks, while a chronic cough may last 8 or more weeks. Coughing is commonly caused by: Infection of the respiratory systemby viruses or bacteria. Breathing in substances that irritate your lungs. Allergies. Asthma. Mucus that runs down the back of your throat (postnasal drip). Smoking. Acid backing up from the stomach into the esophagus (gastroesophageal reflux). Certain medicines. Chronic lung problems. Other medical conditions such as heart failure or a blood clot in the lung (pulmonary embolism). Follow these instructions at home: Medicines Take over-the-counter and prescription medicines only as told by your health care provider. Talk with your health care provider before you take a cough suppressant medicine. Lifestyle  Avoid cigarette smoke. Do not use any products that contain nicotine or tobacco, such as cigarettes, e-cigarettes, and chewing tobacco. If you need help quitting, ask your health care provider. Drink enough fluid to keep your urine pale yellow. Avoid caffeine. Do not drink alcohol if your health care provider tells you not to drink. General instructions  Pay close attention to changes in your cough. Tell your health care provider about them. Always cover your mouth when you cough. Avoid things that make you cough, such as perfume, candles, cleaning products, or campfire or tobacco smoke. If the air is dry, use a cool mist vaporizer or humidifier in your bedroom or your home to help loosen secretions. If your cough is worse at night, try to sleep in a semi-upright position. Rest as  needed. Keep all follow-up visits as told by your health care provider. This is important. Contact a health care provider if you: Have new symptoms. Cough up pus. Have a cough that does not get better after 2-3 weeks or gets worse. Cannot control your cough with cough suppressant medicines and you are losing sleep. Have pain that gets worse or pain that is not helped with medicine. Have a fever. Have unexplained weight loss. Have night sweats. Get help right away if: You cough up blood. You have difficulty breathing. Your heartbeat is very fast. These symptoms may represent a serious problem that is an emergency. Do not wait to see if the symptoms will go away. Get medical help right away. Call your local emergency services (911 in the [place] Do not drive yourself to the hospital. Summary Coughing is a reflex that clears your throat and your airways. It is normal to cough occasionally, but a cough that happens with other symptoms or lasts a long time may be a sign of a condition that needs treatment. Take over-the-counter and prescription medicines only as told by your health care provider. Always cover your mouth when you cough. Contact a health care provider if you have new symptoms or a cough that does not get better after 2-3 weeks or gets worse. This information is not intended to replace advice given to you by your health care provider. Make sure you discuss any questions you have with your health care provider. Document Revised: 07/14/2018 Document Reviewed: 07/14/2018 Elsevier Patient Education  2022 [employer].

## 2021-07-31 NOTE — Assessment & Plan Note (Signed)
Increased frequency of wheezing and coughing and dyspnea increase Symbicort to 160/4.5 1-2 puffs po bid, refill given on Albuterol

## 2021-07-31 NOTE — Progress Notes (Signed)
Subjective:   By signing my name below, I, Gail Sanchez, attest that this documentation has been prepared under the direction and in the presence of Bradd CanaryStacey A Setsuko Robins, MD. 07/31/2021    Patient ID: Gail Sanchez, female    DOB: 1970-08-22, 51 y.o.   MRN: [id number]  Chief Complaint  Patient presents with   Follow-up    Chest congestion     HPI Patient is in today for an office visit and 11 week f/u.  She was first seen on 06/15/2021 for symptoms of congestion that had lasted for the past 3-4 months. No major changes in diet, exercise or lifestyle.  She went to urgent care on 11/24 because she felt like she could not breath and was given prednisone. It did not help and the symptoms returned with chest congestion, fever, chills and she also used ibuprofen. She has tried different antibiotics and steroids which provide temporary relief but she says it feels like there is still something in her chest. She was using albuterol and Symbicort and they provided little relief. Her breathing tests have been stable but she still feels like she cannot breathe. She had Covid-19 a year ago. She also reports constant itchiness and congestion around her sinus area and eye dryness. Adds that she has had occasional bouts of nausea and bitter mucus that is worse in the morning. She reports that the inside of her lips also feel dry and the last bout of laryngitis was more prevalent in the right side of her voice box. Denies abdominal pain, heartburn, loss of appetite and urinary problems.  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   Vaginitis 12/27/2011    Past Surgical History:  Procedure Laterality  Date   cervix frozen      Family History  Problem Relation Age of Onset   Cancer Mother        metastatic   Eczema Mother    Hypertension Father    Blindness Father    Glaucoma Father    Retinal detachment Father    Asthma Brother    Dementia Paternal Grandmother    COPD Paternal Grandfather    Allergic rhinitis Neg Hx    Angioedema Neg Hx    Immunodeficiency Neg Hx    Urticaria Neg Hx     Social History   Socioeconomic History   Marital status: Married    Spouse name: Not on file   Number of children: Not on file   Years of education: Not on file   Highest education level: Not on file  Occupational History   Occupation: Works with school   Tobacco Use   Smoking status: Never   Smokeless tobacco: Never  Vaping Use   Vaping Use: Never used  Substance and Sexual Activity   Alcohol use: Yes    Comment: occ   Drug use: No   Sexual activity: Yes    Birth control/protection: Other-see comments    Comment: husband with vasectomy  Other Topics Concern   Not on file  Social History Narrative   Not on file   Social Determinants of Health   Financial Resource Strain: Not on file  Food Insecurity: Not on file  Transportation Needs: Not on file  Physical Activity: Not on file  Stress: Not on file  Social Connections: Not on file  Intimate Partner Violence: Not on file    Outpatient Medications Prior to Visit  Medication Sig Dispense Refill   desloratadine (CLARINEX) 5 MG tablet Take 1 tablet (5 mg total) by mouth 2 (two) times daily as needed. 180 tablet 1   fluticasone (FLONASE) 50 MCG/ACT nasal spray Place 1-2 sprays into both nostrils daily. 48 mL 1   methocarbamol (ROBAXIN) 500 MG tablet Take 1 tablet (500 mg total) by mouth every 6 (six) hours as needed for muscle spasms. 30 tablet 1   Multiple Vitamin (MULTIVITAMIN) tablet Take 1 tablet by mouth daily.     Spacer/Aero-Holding Rudean Curt Use as directed with inhaler 1 Device 0   albuterol (VENTOLIN HFA)  108 (90 Base) MCG/ACT inhaler Inhale 2 puffs into the lungs every 6 (six) hours as needed for wheezing or shortness of breath. 18 g 0   budesonide-formoterol (SYMBICORT) 80-4.5 MCG/ACT inhaler Inhale 2 puffs into the lungs 2 (two) times daily. 1 each 3   No facility-administered medications prior to visit.    No Known Allergies  Review of Systems  Constitutional:  Negative for fever and malaise/fatigue.  HENT:  Positive for congestion and sinus pain.   Eyes:  Negative for redness.       (+) eye dryness  Respiratory:  Positive for cough, shortness of breath and wheezing.   Cardiovascular:  Negative for chest pain, palpitations and leg swelling.  Gastrointestinal:  Negative for abdominal pain, blood in stool, heartburn and nausea.  Genitourinary:  Negative for dysuria and frequency.  Musculoskeletal:  Negative for falls and myalgias.  Skin:  Negative for rash.  Neurological:  Negative for dizziness, loss of consciousness and headaches.  Endo/Heme/Allergies:  Negative for polydipsia.  Psychiatric/Behavioral:  Negative for depression. The patient is not nervous/anxious.       Objective:    Physical Exam Constitutional:      General: She is not in acute distress.    Appearance: She is well-developed.  HENT:     Head: Normocephalic and atraumatic.     Mouth/Throat:     Pharynx: Posterior oropharyngeal erythema present.  Eyes:     Conjunctiva/sclera: Conjunctivae normal.  Neck:     Thyroid: No thyromegaly.  Cardiovascular:     Rate and Rhythm: Normal rate and regular rhythm.     Heart sounds: Normal heart sounds. No murmur heard. Pulmonary:     Effort: Pulmonary effort is normal. No respiratory distress.     Breath sounds: Normal breath sounds.     Comments: Squeak on right side of chest, mid-lung upon inspiration  Expiratory wheeze  Abdominal:     General: Bowel sounds are normal. There is no distension.     Palpations: Abdomen is soft. There is no mass.     Tenderness:  There is no abdominal tenderness.  Musculoskeletal:     Cervical back: Neck supple.  Lymphadenopathy:     Cervical: No cervical adenopathy.  Skin:    General: Skin is warm and dry.  Neurological:     Mental Status: She is alert and oriented to person, place, and time.  Psychiatric:        Behavior: Behavior normal.    BP 108/70    Pulse 87    Temp 98 F (36.7 C)    Resp 16    Ht 5\' 8"  (  1.727 m)    Wt 143 lb (64.9 kg)    SpO2 96%    BMI 21.74 kg/m  Wt Readings from Last 3 Encounters:  07/31/21 143 lb (64.9 kg)  06/15/21 143 lb 6.4 oz (65 kg)  03/28/21 139 lb 9.6 oz (63.3 kg)    Diabetic Foot Exam - Simple   No data filed    Lab Results  Component Value Date   WBC 4.1 03/28/2021   HGB 13.1 03/28/2021   HCT 39.0 03/28/2021   PLT 120.0 (L) 03/28/2021   GLUCOSE 76 09/08/2020   CHOL 158 09/08/2020   TRIG 32.0 09/08/2020   HDL 77.40 09/08/2020   LDLCALC 74 09/08/2020   ALT 9 09/08/2020   AST 15 09/08/2020   NA 140 09/08/2020   K 3.8 09/08/2020   CL 102 09/08/2020   CREATININE 0.93 09/08/2020   BUN 9 09/08/2020   CO2 30 09/08/2020   TSH 1.31 09/08/2020    Lab Results  Component Value Date   TSH 1.31 09/08/2020   Lab Results  Component Value Date   WBC 4.1 03/28/2021   HGB 13.1 03/28/2021   HCT 39.0 03/28/2021   MCV 90.2 03/28/2021   PLT 120.0 (L) 03/28/2021   Lab Results  Component Value Date   NA 140 09/08/2020   K 3.8 09/08/2020   CO2 30 09/08/2020   GLUCOSE 76 09/08/2020   BUN 9 09/08/2020   CREATININE 0.93 09/08/2020   BILITOT 0.6 09/08/2020   ALKPHOS 56 09/08/2020   AST 15 09/08/2020   ALT 9 09/08/2020   PROT 7.3 09/08/2020   ALBUMIN 4.2 09/08/2020   CALCIUM 9.8 09/08/2020   GFR 72.36 09/08/2020   Lab Results  Component Value Date   CHOL 158 09/08/2020   Lab Results  Component Value Date   HDL 77.40 09/08/2020   Lab Results  Component Value Date   LDLCALC 74 09/08/2020   Lab Results  Component Value Date   TRIG 32.0 09/08/2020    Lab Results  Component Value Date   CHOLHDL 2 09/08/2020   No results found for: HGBA1C     Assessment & Plan:   Problem List Items Addressed This Visit     Moderate persistent asthma    Increased frequency of wheezing and coughing and dyspnea increase Symbicort to 160/4.5 1-2 puffs po bid, refill given on Albuterol      Relevant Medications   budesonide-formoterol (SYMBICORT) 160-4.5 MCG/ACT inhaler   methylPREDNISolone (MEDROL) 4 MG tablet   albuterol (VENTOLIN HFA) 108 (90 Base) MCG/ACT inhaler   Cough - Primary    For a couple of months she has had recurrent bronchitis, cough, intermittent fevers and malaise, she responds temporarily to steroids and antibiotics but then symptoms return. She had COVID last year and has not been the same ever since. Referred to pulmonary for further evaluation. Recent CXR noted to be clear. For possibilty of silent heartburn contributing to cough will refer to gastroenterology and start on Famotidine 40 mg po qhs. Avoid offending foods.       Relevant Orders   Ambulatory referral to Pulmonology   Ambulatory referral to ENT   Ambulatory referral to Gastroenterology   Comprehensive metabolic panel   CBC w/Diff   TSH   Oral lesion    Lower lip mucosal lesion referred to ent for further consideration. And evaluation of recurrent laryngitis and cough      Acid reflux    Possible silent heart burn started on famotidine  Relevant Medications   famotidine (PEPCID) 40 MG tablet   Other Visit Diagnoses     Laryngitis       Relevant Orders   Ambulatory referral to ENT   Ambulatory referral to Gastroenterology   Comprehensive metabolic panel   CBC w/Diff   TSH   Bronchitis, mucopurulent recurrent (HCC)       Relevant Orders   Ambulatory referral to Pulmonology   Ambulatory referral to Gastroenterology   Comprehensive metabolic panel   CBC w/Diff   SOB (shortness of breath)       Relevant Orders   Ambulatory referral to  Pulmonology   Ambulatory referral to Gastroenterology   Comprehensive metabolic panel   CBC w/Diff        Meds ordered this encounter  Medications   budesonide-formoterol (SYMBICORT) 160-4.5 MCG/ACT inhaler    Sig: Inhale 2 puffs into the lungs 2 (two) times daily.    Dispense:  1 each    Refill:  3   methylPREDNISolone (MEDROL) 4 MG tablet    Sig: 6 tabs po x 1 day then 5 tabs po x 1 day then 4 tabs po x 1 day then 3 tabs po x 1 day then 2 tabs po x 1 day then 1 tab po x 1 day and stop    Dispense:  21 tablet    Refill:  0   doxycycline (VIBRA-TABS) 100 MG tablet    Sig: Take 1 tablet (100 mg total) by mouth 2 (two) times daily.    Dispense:  20 tablet    Refill:  0   famotidine (PEPCID) 40 MG tablet    Sig: Take 1 tablet (40 mg total) by mouth at bedtime.    Dispense:  30 tablet    Refill:  2   albuterol (VENTOLIN HFA) 108 (90 Base) MCG/ACT inhaler    Sig: Inhale 2 puffs into the lungs every 6 (six) hours as needed for wheezing or shortness of breath.    Dispense:  18 g    Refill:  0    I,Gail Sanchez,acting as a scribe for Danise Edge, MD.,have documented all relevant documentation on the behalf of Danise Edge, MD,as directed by  Danise Edge, MD while in the presence of Danise Edge, MD.   I, Bradd Canary, MD, personally preformed the services described in this documentation.  All medical record entries made by the scribe were at my direction and in my presence.  I have reviewed the chart and discharge instructions (if applicable) and agree that the record reflects my personal performance and is accurate and complete. 07/31/2021

## 2021-07-31 NOTE — Assessment & Plan Note (Signed)
For a couple of months she has had recurrent bronchitis, cough, intermittent fevers and malaise, she responds temporarily to steroids and antibiotics but then symptoms return. She had COVID last year and has not been the same ever since. Referred to pulmonary for further evaluation. Recent CXR noted to be clear. For possibilty of silent heartburn contributing to cough will refer to gastroenterology and start on Famotidine 40 mg po qhs. Avoid offending foods.

## 2021-07-31 NOTE — Assessment & Plan Note (Signed)
Lower lip mucosal lesion referred to ent for further consideration. And evaluation of recurrent laryngitis and cough

## 2021-07-31 NOTE — Assessment & Plan Note (Signed)
Possible silent heart burn started on famotidine

## 2021-08-01 ENCOUNTER — Encounter: Payer: Self-pay | Admitting: Gastroenterology

## 2021-08-01 LAB — CBC WITH DIFFERENTIAL/PLATELET
Basophils Absolute: 0.1 10*3/uL (ref 0.0–0.1)
Basophils Relative: 1.1 % (ref 0.0–3.0)
Eosinophils Absolute: 0.1 10*3/uL (ref 0.0–0.7)
Eosinophils Relative: 1.6 % (ref 0.0–5.0)
HCT: 38.2 % (ref 36.0–46.0)
Hemoglobin: 12.6 g/dL (ref 12.0–15.0)
Lymphocytes Relative: 16.7 % (ref 12.0–46.0)
Lymphs Abs: 1.1 10*3/uL (ref 0.7–4.0)
MCHC: 33.1 g/dL (ref 30.0–36.0)
MCV: 90.5 fl (ref 78.0–100.0)
Monocytes Absolute: 0.4 10*3/uL (ref 0.1–1.0)
Monocytes Relative: 6.9 % (ref 3.0–12.0)
Neutro Abs: 4.7 10*3/uL (ref 1.4–7.7)
Neutrophils Relative %: 73.7 % (ref 43.0–77.0)
Platelets: 169 10*3/uL (ref 150.0–400.0)
RBC: 4.22 Mil/uL (ref 3.87–5.11)
RDW: 12.8 % (ref 11.5–15.5)
WBC: 6.3 10*3/uL (ref 4.0–10.5)

## 2021-08-01 LAB — COMPREHENSIVE METABOLIC PANEL
ALT: 13 U/L (ref 0–35)
AST: 16 U/L (ref 0–37)
Albumin: 4.1 g/dL (ref 3.5–5.2)
Alkaline Phosphatase: 69 U/L (ref 39–117)
BUN: 9 mg/dL (ref 6–23)
CO2: 31 mEq/L (ref 19–32)
Calcium: 9.6 mg/dL (ref 8.4–10.5)
Chloride: 103 mEq/L (ref 96–112)
Creatinine, Ser: 0.95 mg/dL (ref 0.40–1.20)
GFR: 70.1 mL/min (ref >60.00)
Glucose, Bld: 81 mg/dL (ref 70–99)
Potassium: 3.9 mEq/L (ref 3.5–5.1)
Sodium: 141 mEq/L (ref 135–145)
Total Bilirubin: 0.5 mg/dL (ref 0.2–1.2)
Total Protein: 7.1 g/dL (ref 6.0–8.3)

## 2021-08-01 LAB — TSH: TSH: 1.53 u[IU]/mL (ref 0.35–5.50)

## 2021-08-23 ENCOUNTER — Other Ambulatory Visit: Payer: Self-pay

## 2021-08-23 ENCOUNTER — Encounter: Payer: Self-pay | Admitting: Pulmonary Disease

## 2021-08-23 ENCOUNTER — Ambulatory Visit: Payer: BC Managed Care – PPO | Admitting: Pulmonary Disease

## 2021-08-23 VITALS — BP 114/70 | HR 80 | Ht 68.0 in | Wt 142.0 lb

## 2021-08-23 DIAGNOSIS — R059 Cough, unspecified: Secondary | ICD-10-CM

## 2021-08-23 MED ORDER — IPRATROPIUM BROMIDE 0.03 % NA SOLN: 2.0000 | Freq: Two times a day (BID) | NASAL | 12 refills | Status: DC

## 2021-08-23 NOTE — Patient Instructions (Addendum)
Take symbicort 160-4.55mcg 2 puffs twice daily - rinse mouth out after each use  Use flonase nasal spray 1 spray per nostril, twice daily  Use ipratropium nasal spray, 2 sprays per nostril twice daily for at least 2 weeks before backing down to as needed use.   We will have you follow up in 2 months for pulmonary function tests.

## 2021-08-23 NOTE — Progress Notes (Signed)
Synopsis: Referred in February 2023 for cough, bronchitis by Danise Edge, MD  Subjective:   PATIENT ID: Gail Sanchez GENDER: female DOB: 02-15-1971, MRN: [id number]  HPI  Chief Complaint  Patient presents with   Consult    Referred by PCP for chronic cough after having covid last year. Productive cough with yellow phlegm.    Gail Sanchez is a 51 year old woman, never smoker who is referred to pulmonary clinic for cough and bronchitis.   Patient reports having COVID-19 infection January 2022 with very mild symptoms.  After her infection she developed a cough that has persisted over the past year.  She reports intermittent sinus congestion with postnasal drainage along with shortness of breath and wheezing.  She has been evaluated at the [facility] at [facility] where she was treated with azithromycin 250 mg daily for 30 days along with Flonase nasal spray and ipratropium nasal spray.  She noted some mild improvement but never resolution of her symptoms.  She never used the Flonase and ipratropium at the same time.  She has been treated with multiple rounds of other antibiotics and steroid tapers with improvement of her cough but then her symptoms are quick to return.  Prior to her COVID infection she did not experience any of the symptoms.  She is currently using Symbicort 160-4.5 mcg 1 puff twice daily.  She continues to have sinus congestion and postnasal drainage.  She does not report any overt heartburn or reflux symptoms.  She was started empirically on famotidine therapy by her primary care but she is self discontinued this medication as it caused indigestion.  She denies any nighttime awakenings with cough, wheezing or shortness of breath.  She denies any seasonal allergy.  She is a never smoker.  She works as a Engineer, building services.  She denies any harmful dust or chemical exposures.  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   Vaginitis 12/27/2011     Family History  Problem Relation Age of Onset   Cancer Mother        metastatic   Eczema Mother    Hypertension Father    Blindness Father    Glaucoma Father    Retinal detachment Father    Asthma Brother    Dementia Paternal Grandmother    COPD Paternal Grandfather    Allergic rhinitis Neg Hx    Angioedema Neg Hx    Immunodeficiency Neg Hx    Urticaria Neg Hx      Social History   Socioeconomic History   Marital status: Married    Spouse name: Not on file   Number of children: Not on file   Years of education: Not on file   Highest education level: Not on file  Occupational History   Occupation: Works with school   Tobacco Use   Smoking status: Never   Smokeless tobacco: Never  Vaping Use   Vaping Use: Never used  Substance and Sexual Activity   Alcohol use: Yes    Comment: occ   Drug use: No   Sexual activity: Yes    Birth control/protection: Other-see comments    Comment:  husband with vasectomy  Other Topics Concern   Not on file  Social History Narrative   Not on file   Social Determinants of Health   Financial Resource Strain: Not on file  Food Insecurity: Not on file  Transportation Needs: Not on file  Physical Activity: Not on file  Stress: Not on file  Social Connections: Not on file  Intimate Partner Violence: Not on file     No Known Allergies   Outpatient Medications Prior to Visit  Medication Sig Dispense Refill   albuterol (VENTOLIN HFA) 108 (90 Base) MCG/ACT inhaler Inhale 2 puffs into the lungs every 6 (six) hours as needed for wheezing or shortness of breath. 18 g 0   budesonide-formoterol (SYMBICORT) 160-4.5 MCG/ACT inhaler  Inhale 2 puffs into the lungs 2 (two) times daily. 1 each 3   desloratadine (CLARINEX) 5 MG tablet Take 1 tablet (5 mg total) by mouth 2 (two) times daily as needed. 180 tablet 1   fluticasone (FLONASE) 50 MCG/ACT nasal spray Place 1-2 sprays into both nostrils daily. 48 mL 1   methocarbamol (ROBAXIN) 500 MG tablet Take 1 tablet (500 mg total) by mouth every 6 (six) hours as needed for muscle spasms. 30 tablet 1   Multiple Vitamin (MULTIVITAMIN) tablet Take 1 tablet by mouth daily.     Spacer/Aero-Holding Rudean Curt Use as directed with inhaler 1 Device 0   doxycycline (VIBRA-TABS) 100 MG tablet Take 1 tablet (100 mg total) by mouth 2 (two) times daily. 20 tablet 0   famotidine (PEPCID) 40 MG tablet Take 1 tablet (40 mg total) by mouth at bedtime. 30 tablet 2   methylPREDNISolone (MEDROL) 4 MG tablet 6 tabs po x 1 day then 5 tabs po x 1 day then 4 tabs po x 1 day then 3 tabs po x 1 day then 2 tabs po x 1 day then 1 tab po x 1 day and stop (Patient not taking: Reported on 08/23/2021) 21 tablet 0   No facility-administered medications prior to visit.    Review of Systems  Constitutional:  Negative for chills, fever, malaise/fatigue and weight loss.  HENT:  Positive for congestion. Negative for sinus pain and sore throat.   Eyes: Negative.   Respiratory:  Positive for cough, sputum production and shortness of breath. Negative for hemoptysis and wheezing.   Cardiovascular:  Negative for chest pain, palpitations, orthopnea, claudication and leg swelling.  Gastrointestinal:  Negative for abdominal pain, heartburn, nausea and vomiting.  Genitourinary: Negative.   Musculoskeletal:  Negative for joint pain and myalgias.  Skin:  Negative for rash.  Neurological:  Negative for weakness.  Endo/Heme/Allergies: Negative.   Psychiatric/Behavioral: Negative.       Objective:   Vitals:   08/23/21 1412  BP: 114/70  Pulse: 80  SpO2: 99%  Weight: 142 lb (64.4 kg)  Height: 5\' 8"  (1.727 m)      Physical Exam Constitutional:      General: She is not in acute distress.    Appearance: She is not ill-appearing.  HENT:     Head: Normocephalic and atraumatic.     Nose:     Comments: Erythema of nostrils    Mouth/Throat:     Mouth: Mucous membranes are moist.     Pharynx: Oropharynx is clear.  Eyes:     General: No scleral icterus.    Conjunctiva/sclera: Conjunctivae normal.     Pupils: Pupils are equal, round, and reactive to light.  Cardiovascular:     Rate and  Rhythm: Normal rate and regular rhythm.     Pulses: Normal pulses.     Heart sounds: Normal heart sounds. No murmur heard. Pulmonary:     Effort: Pulmonary effort is normal.     Breath sounds: Wheezing (mild, left anterior) present. No rhonchi or rales.  Abdominal:     General: Bowel sounds are normal.     Palpations: Abdomen is soft.  Musculoskeletal:     Right lower leg: No edema.     Left lower leg: No edema.  Lymphadenopathy:     Cervical: No cervical adenopathy.  Skin:    General: Skin is warm and dry.  Neurological:     General: No focal deficit present.     Mental Status: She is alert.  Psychiatric:        Mood and Affect: Mood normal.        Behavior: Behavior normal.        Thought Content: Thought content normal.        Judgment: Judgment normal.   CBC    Component Value Date/Time   WBC 6.3 07/31/2021 1612   RBC 4.22 07/31/2021 1612   HGB 12.6 07/31/2021 1612   HCT 38.2 07/31/2021 1612   PLT 169.0 07/31/2021 1612   MCV 90.5 07/31/2021 1612   MCH 29.6 02/15/2014 0936   MCHC 33.1 07/31/2021 1612   RDW 12.8 07/31/2021 1612   LYMPHSABS 1.1 07/31/2021 1612   MONOABS 0.4 07/31/2021 1612   EOSABS 0.1 07/31/2021 1612   BASOSABS 0.1 07/31/2021 1612   BMP Latest Ref Rng & Units 07/31/2021 09/08/2020 02/26/2020  Glucose 70 - 99 mg/dL 81 76 92  BUN 6 - 23 mg/dL 9 9 8   Creatinine 0.40 - 1.20 mg/dL 5.85 2.77  Sodium 135 - 145 mEq/L 141 140 140  Potassium 3.5 - 5.1 mEq/L 3.9 3.8 3.8   Chloride 96 - 112 mEq/L 103 102 103  CO2 19 - 32 mEq/L 31 30 30   Calcium 8.4 - 10.5 mg/dL 9.6 9.8 9.5   Chest imaging: CXR 06/15/21 Cardiomediastinal silhouette unchanged in size and contour. No evidence of central vascular congestion. No interlobular septal thickening. No pneumothorax or pleural effusion. No confluent airspace disease.  PFT: No flowsheet data found.  Labs:  Path:  Echo 07/25/21: LV EF 60-65%. RV size and systolic function are normal. Mild mitral valve regurgitation.  Heart Catheterization:  Assessment & Plan:   Cough, unspecified type - Plan: Pulmonary Function Test, ipratropium (ATROVENT) 0.03 % nasal spray  Discussion: Zafiro Gertsch is a 51 year old woman, never smoker who is referred to pulmonary clinic for cough and bronchitis.   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  It is unknown if silent reflux is contributing to her cough. She has been referred to GI for further evaluation.  She is to use her Symbicort 160-4.5 mcg inhaler 2 puffs twice daily from 1 puff twice daily.  She is to use Flonase nasal spray 1 spray per nostril daily.  She is to use ipratropium nasal spray 2 sprays per nostril twice daily.  She is to continue the above inhaler and nasal sprays over the next 2 months.  She is to follow-up in 2 months with pulmonary function test.  If there are any abnormalities in the pulmonary function test or on going cough, we will then move forward with high-resolution CT chest scan for further evaluation.  07/27/21, MD [facility] Office: [phone]   Current Outpatient Medications:    albuterol (VENTOLIN HFA) 108 (90 Base) MCG/ACT inhaler, Inhale 2 puffs into the lungs every 6 (six) hours as needed for wheezing or shortness of breath., Disp: 18 g, Rfl: 0   budesonide-formoterol (SYMBICORT) 160-4.5 MCG/ACT inhaler, Inhale 2  puffs into the lungs 2 (two) times daily., Disp: 1 each, Rfl: 3   desloratadine (CLARINEX) 5 MG tablet, Take 1 tablet (5 mg total) by mouth 2 (two) times daily as needed., Disp: 180 tablet, Rfl: 1   fluticasone (FLONASE) 50 MCG/ACT nasal spray, Place 1-2 sprays into both nostrils daily., Disp: 48 mL, Rfl: 1   ipratropium (ATROVENT) 0.03 % nasal spray, Place 2 sprays into both nostrils every 12 (twelve) hours., Disp: 30 mL, Rfl: 12   methocarbamol (ROBAXIN) 500 MG tablet, Take 1 tablet (500 mg total) by mouth every 6 (six) hours as needed for muscle spasms., Disp: 30 tablet, Rfl: 1   Multiple Vitamin (MULTIVITAMIN) tablet, Take 1 tablet by mouth daily., Disp: , Rfl:    Spacer/Aero-Holding Chambers DEVI, Use as directed with inhaler, Disp: 1 Device, Rfl: 0

## 2021-08-29 ENCOUNTER — Ambulatory Visit (INDEPENDENT_AMBULATORY_CARE_PROVIDER_SITE_OTHER): Payer: BC Managed Care – PPO | Admitting: Gastroenterology

## 2021-08-29 ENCOUNTER — Encounter: Payer: Self-pay | Admitting: Gastroenterology

## 2021-08-29 VITALS — BP 114/76 | HR 80 | Ht 68.0 in | Wt 142.0 lb

## 2021-08-29 DIAGNOSIS — R059 Cough, unspecified: Secondary | ICD-10-CM

## 2021-08-29 DIAGNOSIS — Z1211 Encounter for screening for malignant neoplasm of colon: Secondary | ICD-10-CM | POA: Diagnosis not present

## 2021-08-29 DIAGNOSIS — Z1212 Encounter for screening for malignant neoplasm of rectum: Secondary | ICD-10-CM | POA: Diagnosis not present

## 2021-08-29 MED ORDER — SUTAB 1479-225-188 MG PO TABS: 1.0000 | ORAL_TABLET | Freq: Once | ORAL | 0 refills | Status: AC

## 2021-08-29 NOTE — Progress Notes (Signed)
HPI : Marjoria Harbold is a very pleasant 51 year old female is referred to [place] by Dr. Wendy Poet for initial average risk screening colonoscopy, and for further evaluation of chronic cough.  The patient states that her cough started after a COVID infection last year.  She denies any problems with cough prior to this.  Since then, she is bothered by frequent coughing episodes, which she describes as a irritation of the chest.  She also has chronic sinus issues which started after the COVID infection.  Some of her cough symptoms have improved after she was treated with azithromycin.  In addition to the cough she also has problems with shortness of breath and wheezing. Since her symptoms started she is been given multiple rounds of antibiotics and short burst of steroids with transient improvement in her symptoms.  She is being treated with inhaled steroids and albuterol, as well as intranasal steroids and antihistamines. She denies nighttime symptoms. She denies any symptoms of GERD to include heartburn, acid regurgitation, throat irritation/globus sensation, hoarseness/sore throat, nausea/vomiting.  No dysphagia or atypical chest pain symptoms.  Symptoms not worse at night or in supine position.  Symptoms do not vary with the meals she eats. She was recommended to try a PPI, but declined because she did not feel it was going to help. She also denies any lower GI symptoms such as abdominal pain, constipation, diarrhea or blood in the stool.  She has no family history of colon cancer.  She has never had an upper or lower endoscopy.   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   Vaginitis 12/27/2011      Past Surgical History:  Procedure Laterality Date   cervix frozen     Family History  Problem Relation Age of Onset   Cancer Mother        metastatic   Eczema Mother    Hypertension Father    Blindness Father    Glaucoma Father    Retinal detachment Father    Asthma Brother    Dementia Paternal Grandmother    COPD Paternal Grandfather    Allergic rhinitis Neg Hx    Angioedema Neg Hx    Immunodeficiency Neg Hx    Urticaria Neg Hx    Social History   Tobacco Use   Smoking status: Never   Smokeless tobacco: Never  Vaping Use   Vaping Use: Never used  Substance Use Topics   Alcohol use: Yes    Comment: occ   Drug use: No   Current Outpatient Medications  Medication Sig Dispense Refill   albuterol (VENTOLIN HFA) 108 (90 Base) MCG/ACT inhaler Inhale 2 puffs into the lungs every 6 (six) hours as needed for wheezing or shortness of breath. 18 g 0   budesonide-formoterol (SYMBICORT) 160-4.5 MCG/ACT inhaler Inhale 2 puffs into the lungs 2 (two) times daily. 1 each 3   desloratadine (CLARINEX) 5 MG tablet Take 1 tablet (5 mg total) by mouth 2 (two) times daily as needed. 180 tablet 1   fluticasone (FLONASE) 50 MCG/ACT nasal spray Place 1-2 sprays into both nostrils daily. 48 mL 1   methocarbamol (ROBAXIN) 500 MG tablet Take 1  tablet (500 mg total) by mouth every 6 (six) hours as needed for muscle spasms. 30 tablet 1   Multiple Vitamin (MULTIVITAMIN) tablet Take 1 tablet by mouth daily.     Spacer/Aero-Holding Rudean Curt Use as directed with inhaler 1 Device 0   No current facility-administered medications for this visit.   No Known Allergies   Review of Systems: All systems reviewed and negative except where noted in HPI.    No results found.  Physical Exam: BP 114/76    Pulse 80    Ht 5\' 8"  (1.727 m)    Wt 142 lb (64.4 kg)    LMP 08/18/2021 (Approximate)    BMI 21.59 kg/m  Constitutional: Pleasant,well-developed, African-American female in no acute  distress. HEENT: Normocephalic and atraumatic. Conjunctivae are normal. No scleral icterus. Neck supple.  Cardiovascular: Normal rate, regular rhythm.  Pulmonary/chest: Effort normal and breath sounds normal. No wheezing, rales or rhonchi. Abdominal: Soft, nondistended, nontender. Bowel sounds active throughout. There are no masses palpable. No hepatomegaly. Extremities: no edema Neurological: Alert and oriented to person place and time. Skin: Skin is warm and dry. No rashes noted. Psychiatric: Normal mood and affect. Behavior is normal.  CBC    Component Value Date/Time   WBC 6.3 07/31/2021 1612   RBC 4.22 07/31/2021 1612   HGB 12.6 07/31/2021 1612   HCT 38.2 07/31/2021 1612   PLT 169.0 07/31/2021 1612   MCV 90.5 07/31/2021 1612   MCH 29.6 02/15/2014 0936   MCHC 33.1 07/31/2021 1612   RDW 12.8 07/31/2021 1612   LYMPHSABS 1.1 07/31/2021 1612   MONOABS 0.4 07/31/2021 1612   EOSABS 0.1 07/31/2021 1612   BASOSABS 0.1 07/31/2021 1612    CMP     Component Value Date/Time   NA 141 07/31/2021 1612   K 3.9 07/31/2021 1612   CL 103 07/31/2021 1612   CO2 31 07/31/2021 1612   GLUCOSE 81 07/31/2021 1612   BUN 9 07/31/2021 1612   CREATININE 0.95 07/31/2021 1612   CREATININE 0.90 10/04/2016 0915   CALCIUM 9.6 07/31/2021 1612   PROT 7.1 07/31/2021 1612   ALBUMIN 4.1 07/31/2021 1612   AST 16 07/31/2021 1612   ALT 13 07/31/2021 1612   ALKPHOS 69 07/31/2021 1612   BILITOT 0.5 07/31/2021 1612     ASSESSMENT AND PLAN: 51 year old female overdue for initial average rescreening colonoscopy.  In addition, she has a chronic cough which began following a COVID infection.  She also has symptoms of wheezing and shortness of breath.  She is scheduled for pulmonary function tests to assess for asthma.  She has been treated with inhaled steroids and intranasal steroids.  She has no GI symptoms, and other than cough, no other atypical GERD symptoms.  None of the features of her cough are  suggestive of GERD.  Although an upper endoscopy can symptoms be done to further evaluate for evidence of GERD, most patients with GERD have a normal endoscopy.  I told her that a trial of PPI would be more diagnostic and more helpful than an upper endoscopy, and suggested this instead of an upper endoscopy.   CRC screening - Colonosocpy  Cough - Unlikely to be GERD - Recommended PPI trial first  The details, risks (including bleeding, perforation, infection, missed lesions, medication reactions and possible hospitalization or surgery if complications occur), benefits, and alternatives to colonoscopy with possible biopsy and possible polypectomy were discussed with the patient and she consents to proceed.    CC: 44, MD

## 2021-08-29 NOTE — Patient Instructions (Signed)
If you are age 50 or older, your body mass index should be between 23-30. Your Body mass index is 21.59 kg/m. If this is out of the aforementioned range listed, please consider follow up with your Primary Care Provider.  If you are age 16 or younger, your body mass index should be between 19-25. Your Body mass index is 21.59 kg/m. If this is out of the aformentioned range listed, please consider follow up with your Primary Care Provider.   You have been scheduled for a colonoscopy. Please follow written instructions given to you at your visit today.  Please pick up your prep supplies at the pharmacy within the next 1-3 days. If you use inhalers (even only as needed), please bring them with you on the day of your procedure.   The [facility] providers would like to encourage you to use [facility] to communicate with providers for non-urgent requests or questions.  Due to long hold times on the telephone, sending your provider a message by [facility] may be a faster and more efficient way to get a response.  Please allow 48 business hours for a response.  Please remember that this is for non-urgent requests.   It was a pleasure to see you today!  Thank you for trusting me with your gastrointestinal care!    Scott E.Tomasa Rand, MD

## 2021-08-30 ENCOUNTER — Encounter: Payer: Self-pay | Admitting: Gastroenterology

## 2021-09-04 ENCOUNTER — Other Ambulatory Visit (HOSPITAL_BASED_OUTPATIENT_CLINIC_OR_DEPARTMENT_OTHER): Payer: Self-pay | Admitting: Family Medicine

## 2021-09-04 ENCOUNTER — Encounter: Payer: Self-pay | Admitting: Gastroenterology

## 2021-09-04 ENCOUNTER — Ambulatory Visit (AMBULATORY_SURGERY_CENTER): Payer: BC Managed Care – PPO | Admitting: Gastroenterology

## 2021-09-04 ENCOUNTER — Other Ambulatory Visit: Payer: Self-pay

## 2021-09-04 VITALS — BP 99/60 | HR 70 | Temp 98.9°F | Resp 15 | Ht 68.0 in | Wt 142.0 lb

## 2021-09-04 DIAGNOSIS — Z1231 Encounter for screening mammogram for malignant neoplasm of breast: Secondary | ICD-10-CM

## 2021-09-04 DIAGNOSIS — Z1212 Encounter for screening for malignant neoplasm of rectum: Secondary | ICD-10-CM

## 2021-09-04 DIAGNOSIS — Z1211 Encounter for screening for malignant neoplasm of colon: Secondary | ICD-10-CM | POA: Diagnosis present

## 2021-09-04 MED ORDER — SODIUM CHLORIDE 0.9 % IV SOLN: 500.0000 mL | Freq: Once | INTRAVENOUS | Status: DC

## 2021-09-04 NOTE — Progress Notes (Signed)
History and Physical Interval Note:  09/04/2021 8:48 AM  Gail Sanchez  has presented today for endoscopic procedure(s), with the diagnosis of  Encounter Diagnosis  Name Primary?   Screening for colorectal cancer Yes  .  The various methods of evaluation and treatment have been discussed with the patient and/or family. After consideration of risks, benefits and other options for treatment, the patient has consented to  the endoscopic procedure(s).   The patient's history has been reviewed, patient examined, no change in status, stable for endoscopic procedure(s).  I have reviewed the patient's chart and labs.  Questions were answered to the patient's satisfaction.     Diesha Rostad E. Tomasa Rand, MD [facility]

## 2021-09-04 NOTE — Progress Notes (Signed)
Pt's states no medical or surgical changes since previsit or office visit. 

## 2021-09-04 NOTE — Patient Instructions (Signed)
YOU HAD AN ENDOSCOPIC PROCEDURE TODAY AT THE [facility]:   Refer to the procedure report that was given to you for any specific questions about what was found during the examination.  If the procedure report does not answer your questions, please call your gastroenterologist to clarify.  If you requested that your care partner not be given the details of your procedure findings, then the procedure report has been included in a sealed envelope for you to review at your convenience later. ° °YOU SHOULD EXPECT: Some feelings of bloating in the abdomen. Passage of more gas than usual.  Walking can help get rid of the air that was put into your GI tract during the procedure and reduce the bloating. If you had a lower endoscopy (such as a colonoscopy or flexible sigmoidoscopy) you may notice spotting of blood in your stool or on the toilet paper. If you underwent a bowel prep for your procedure, you may not have a normal bowel movement for a few days. ° °Please Note:  You might notice some irritation and congestion in your nose or some drainage.  This is from the oxygen used during your procedure.  There is no need for concern and it should clear up in a day or so. ° °SYMPTOMS TO REPORT IMMEDIATELY: ° °Following lower endoscopy (colonoscopy or flexible sigmoidoscopy): ° Excessive amounts of blood in the stool ° Significant tenderness or worsening of abdominal pains ° Swelling of the abdomen that is new, acute ° Fever of 100°F or higher ° °For urgent or emergent issues, a gastroenterologist can be reached at any hour by calling [phone]. °Do not use MyChart messaging for urgent concerns.  ° ° °DIET:  We do recommend a small meal at first, but then you may proceed to your regular diet.  Drink plenty of fluids but you should avoid alcoholic beverages for 24 hours. ° °ACTIVITY:  You should plan to take it easy for the rest of today and you should NOT DRIVE or use heavy machinery until tomorrow (because of  the sedation medicines used during the test).   ° °FOLLOW UP: °Our staff will call the number listed on your records 48-72 hours following your procedure to check on you and address any questions or concerns that you may have regarding the information given to you following your procedure. If we do not reach you, we will leave a message.  We will attempt to reach you two times.  During this call, we will ask if you have developed any symptoms of COVID 19. If you develop any symptoms (ie: fever, flu-like symptoms, shortness of breath, cough etc.) before then, please call [phone].  If you test positive for Covid 19 in the 2 weeks post procedure, please call and report this information to us.   ° °SIGNATURES/CONFIDENTIALITY: °You and/or your care partner have signed paperwork which will be entered into your electronic medical record.  These signatures attest to the fact that that the information above on your After Visit Summary has been reviewed and is understood.  Full responsibility of the confidentiality of this discharge information lies with you and/or your care-partner.  °

## 2021-09-04 NOTE — Op Note (Signed)
[facility] Patient Name: Gail Sanchez Procedure Date: 09/04/2021 8:49 AM MRN: [id number] Endoscopist: Lorin Picket E. Tomasa Rand , MD Age: 51 Referring MD:  Date of Birth: 1970/11/21 Gender: Female Account #: [id number] Procedure:                Colonoscopy Indications:              Screening for colorectal malignant neoplasm, This                            is the patient's first colonoscopy Medicines:                Monitored Anesthesia Care Procedure:                Pre-Anesthesia Assessment:                           - Prior to the procedure, a History and Physical                            was performed, and patient medications and                            allergies were reviewed. The patient's tolerance of                            previous anesthesia was also reviewed. The risks                            and benefits of the procedure and the sedation                            options and risks were discussed with the patient.                            All questions were answered, and informed consent                            was obtained. Prior Anticoagulants: The patient has                            taken no previous anticoagulant or antiplatelet                            agents. ASA Grade Assessment: II - A patient with                            mild systemic disease. After reviewing the risks                            and benefits, the patient was deemed in                            satisfactory condition to undergo the procedure.  After obtaining informed consent, the colonoscope                            was passed under direct vision. Throughout the                            procedure, the patient's blood pressure, pulse, and                            oxygen saturations were monitored continuously. The                            Olympus CF-HQ190L [id number]) Colonoscope was                            introduced through  the anus and advanced to the the                            terminal ileum, with identification of the                            appendiceal orifice and IC valve. The colonoscopy                            was performed without difficulty. The patient                            tolerated the procedure well. The quality of the                            bowel preparation was good. The terminal ileum,                            ileocecal valve, appendiceal orifice, and rectum                            were photographed. The bowel preparation used was                            Sutab via split dose instruction. Scope In: 8:55:28 AM Scope Out: 9:12:39 AM Scope Withdrawal Time: 0 hours 11 minutes 4 seconds  Total Procedure Duration: 0 hours 17 minutes 11 seconds  Findings:                 The perianal and digital rectal examinations were                            normal. Pertinent negatives include normal                            sphincter tone and no palpable rectal lesions.                           The colon (entire examined portion) appeared normal.  The terminal ileum appeared normal.                           The retroflexed view of the distal rectum and anal                            verge was normal and showed no anal or rectal                            abnormalities. Complications:            No immediate complications. Estimated Blood Loss:     Estimated blood loss: none. Impression:               - The entire examined colon is normal.                           - The examined portion of the ileum was normal.                           - The distal rectum and anal verge are normal on                            retroflexion view.                           - No specimens collected. Recommendation:           - Patient has a contact number available for                            emergencies. The signs and symptoms of potential                             delayed complications were discussed with the                            patient. Return to normal activities tomorrow.                            Written discharge instructions were provided to the                            patient.                           - Resume previous diet.                           - Continue present medications.                           - Repeat colonoscopy in 10 years for screening                            purposes. Melani Brisbane E. Tomasa Rand, MD 09/04/2021 9:17:48 AM This report has been signed electronically.

## 2021-09-04 NOTE — Progress Notes (Signed)
PT taken to PACU. Monitors in place. VSS. Report given to RN. 

## 2021-09-06 ENCOUNTER — Telehealth: Payer: Self-pay

## 2021-09-06 NOTE — Telephone Encounter (Signed)
?  Follow up Call- ? ?Call back number 09/04/2021  ?Post procedure Call Back phone  # [phone]  ?Permission to leave phone message Yes  ?Some recent data might be hidden  ?  ? ?Patient questions: ? ?Do you have a fever, pain , or abdominal swelling? No. ?Pain Score  0 * ? ?Have you tolerated food without any problems? Yes.   ? ?Have you been able to return to your normal activities? Yes.   ? ?Do you have any questions about your discharge instructions: ?Diet   No. ?Medications  No. ?Follow up visit  No. ? ?Do you have questions or concerns about your Care? No. ? ?Actions: ?* If pain score is 4 or above: ?No action needed, pain <4. ? ? ?

## 2021-09-11 ENCOUNTER — Ambulatory Visit (HOSPITAL_BASED_OUTPATIENT_CLINIC_OR_DEPARTMENT_OTHER)
Admission: RE | Admit: 2021-09-11 | Discharge: 2021-09-11 | Disposition: A | Discharge status: Home / Self Care | Payer: BC Managed Care – PPO | Source: Ambulatory Visit | Attending: Family Medicine | Admitting: Family Medicine

## 2021-09-11 ENCOUNTER — Encounter (HOSPITAL_BASED_OUTPATIENT_CLINIC_OR_DEPARTMENT_OTHER): Payer: Self-pay

## 2021-09-11 ENCOUNTER — Encounter: Payer: BC Managed Care – PPO | Admitting: Family Medicine

## 2021-09-11 ENCOUNTER — Other Ambulatory Visit: Payer: Self-pay

## 2021-09-11 DIAGNOSIS — Z1231 Encounter for screening mammogram for malignant neoplasm of breast: Secondary | ICD-10-CM | POA: Diagnosis present

## 2021-09-14 ENCOUNTER — Telehealth: Payer: Self-pay | Admitting: *Deleted

## 2021-09-14 NOTE — Telephone Encounter (Signed)
Open in error

## 2021-09-20 NOTE — Progress Notes (Signed)
? ?BP 107/67   Pulse 89   Ht 5\' 8"  (1.727 m)   Wt 135 lb 12.8 oz (61.6 kg)   BMI 20.65 kg/m?   ? ?Subjective:  ? ? Patient ID: Gail Sanchez, female    DOB: Oct 06, 1970, 51 y.o.   MRN: [id number] ? ?HPI: ?Gail Sanchez is a 51 y.o. female presenting on 09/21/2021 for comprehensive medical examination. Current medical complaints include: none ? ?She currently lives with: husband and children ?Interim Problems from her last visit: ongoing rhinitis and cough - has been following with Pulmonology and Allergy/Asthma; upcoming PFTs ? ?She reports regular vision exams q1-5y: yes ?She reports regular dental exams q 2627m: yes ?Her diet consists of: regular ?She endorses exercise and/or activity of: 4 days a week  ?She works at: school system  ? ?She denies ETOH use. ?She denies nictoine use. ?She denies illegal substance use.  ? ? ?She reports regular menstrual periods with average flow. ?Current menopausal symptoms: no ?She is currently  sexually active  ?She denies  concerns today about STI ?Contraception choices are:  vasectomy for husband  ? ?She denies concerns about skin changes today. ?She denies concerns about bowel changes today. ?She denies concerns about bladder changes today. ? ?Depression Screen done today and results listed below:  ?[facility] 09/21/2021 12/15/2020 04/15/2020 09/14/2014  ?Decreased Interest 0 0 0 0  ?Down, Depressed, Hopeless 0 0 0 0  ?PHQ - 2 Score 0 0 0 0  ? ? ?She does not have a history of falls.  ? ? ? ? ?Past Medical History:  ?Past Medical History:  ?Diagnosis Date  ? Allergic state 06/09/2012  ? Anxiety 06/25/2017  ? Chicken pox as a child  ? Fatigue 09/28/2011  ? Low back pain 05/28/2015  ? Mild intermittent asthma without complication 03/03/2021  ? Onychomycosis 05/27/2016  ? Preventative health care 06/09/2012  ? 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 Vaginitis 12/27/2011  ? ? ?Surgical History:  ?Past Surgical History:  ?Procedure Laterality  Date  ? cervix frozen    ? ? ?Medications:  ?Current Outpatient Medications on File Prior to Visit  ?Medication Sig  ? budesonide-formoterol (SYMBICORT) 160-4.5 MCG/ACT inhaler Inhale 2 puffs into the lungs 2 (two) times daily.  ? desloratadine (CLARINEX) 5 MG tablet Take 1 tablet (5 mg total) by mouth 2 (two) times daily as needed.  ? fluticasone (FLONASE) 50 MCG/ACT nasal spray Place 1-2 sprays into both nostrils daily.  ? methocarbamol (ROBAXIN) 500 MG tablet Take 1 tablet (500 mg total) by mouth every 6 (six) hours as needed for muscle spasms.  ? Multiple Vitamin (MULTIVITAMIN) tablet Take 1 tablet by mouth daily.  ? Spacer/Aero-Holding Rudean Curthambers DEVI Use as directed with inhaler  ? ?No current facility-administered medications on file prior to visit.  ? ? ?Allergies:  ?No Known Allergies ? ?Social History:  ?Social History  ? ?Socioeconomic History  ? Marital status: Married  ?  Spouse name: Not on file  ? Number of children: Not on file  ? Years of education: Not on file  ? Highest education level: Not on file  ?Occupational History  ? Occupation: Works with school   ?Tobacco Use  ? Smoking status: Never  ? Smokeless tobacco: Never  ?Vaping Use  ? Vaping Use: Never used  ?Substance and Sexual Activity  ? Alcohol use: Yes  ?  Comment: occ  ? Drug use: No  ? Sexual activity: Yes  ?  Birth control/protection: Other-see comments  ?  Comment: husband with vasectomy  ?Other Topics Concern  ? Not on file  ?Social History Narrative  ? Not on file  ? ?Social Determinants of Health  ? ?Financial Resource Strain: Not on file  ?Food Insecurity: Not on file  ?Transportation Needs: Not on file  ?Physical Activity: Not on file  ?Stress: Not on file  ?Social Connections: Not on file  ?Intimate Partner Violence: Not on file  ? ?Social History  ? ?Tobacco Use  ?Smoking Status Never  ?Smokeless Tobacco Never  ? ?Social History  ? ?Substance and Sexual Activity  ?Alcohol Use Yes  ? Comment: occ  ? ? ?Family History:  ?Family  History  ?Problem Relation Age of Onset  ? Cancer Mother   ?     metastatic  ? Eczema Mother   ? Hypertension Father   ? Blindness Father   ? Glaucoma Father   ? Retinal detachment Father   ? Asthma Brother   ? Dementia Paternal Grandmother   ? COPD Paternal Grandfather   ? Allergic rhinitis Neg Hx   ? Angioedema Neg Hx   ? Immunodeficiency Neg Hx   ? Urticaria Neg Hx   ? Colon cancer Neg Hx   ? Esophageal cancer Neg Hx   ? Stomach cancer Neg Hx   ? Rectal cancer Neg Hx   ? ? ?Past medical history, surgical history, medications, allergies, family history and social history reviewed with patient today and changes made to appropriate areas of the chart.  ? ?All ROS negative except what is listed above and in the HPI.  ? ?   ?Objective:  ?  ?BP 107/67   Pulse 89   Ht 5\' 8"  (1.727 m)   Wt 135 lb 12.8 oz (61.6 kg)   BMI 20.65 kg/m?   ?Wt Readings from Last 3 Encounters:  ?09/21/21 135 lb 12.8 oz (61.6 kg)  ?09/04/21 142 lb (64.4 kg)  ?08/29/21 142 lb (64.4 kg)  ?  ?Physical Exam ?Vitals reviewed.  ?Constitutional:   ?   Appearance: Normal appearance.  ?HENT:  ?   Head: Normocephalic and atraumatic.  ?   Right Ear: Tympanic membrane normal.  ?   Left Ear: Tympanic membrane normal.  ?   Nose: Nose normal.  ?   Mouth/Throat:  ?   Mouth: Mucous membranes are moist.  ?   Pharynx: Oropharynx is clear.  ?Eyes:  ?   Extraocular Movements: Extraocular movements intact.  ?   Conjunctiva/sclera: Conjunctivae normal.  ?   Pupils: Pupils are equal, round, and reactive to light.  ?Cardiovascular:  ?   Rate and Rhythm: Normal rate and regular rhythm.  ?   Pulses: Normal pulses.  ?Pulmonary:  ?   Effort: Pulmonary effort is normal.  ?   Breath sounds: Normal breath sounds. No wheezing, rhonchi or rales.  ?Abdominal:  ?   General: Abdomen is flat. Bowel sounds are normal. There is no distension.  ?   Palpations: Abdomen is soft. There is no mass.  ?   Tenderness: There is no abdominal tenderness. There is no guarding or rebound.   ?Musculoskeletal:     ?   General: Normal range of motion.  ?   Cervical back: Normal range of motion and neck supple.  ?Skin: ?   General: Skin is warm and dry.  ?   Capillary Refill: Capillary refill takes less than 2 seconds.  ?Neurological:  ?   General: No focal  deficit present.  ?   Mental Status: She is alert and oriented to person, place, and time. Mental status is at baseline.  ?   Cranial Nerves: No cranial nerve deficit.  ?   Motor: No weakness.  ?Psychiatric:     ?   Mood and Affect: Mood normal.     ?   Behavior: Behavior normal.     ?   Thought Content: Thought content normal.     ?   Judgment: Judgment normal.  ? ? ? ? ? ?Results for orders placed or performed in visit on 07/31/21  ?Comprehensive metabolic panel  ?Result Value Ref Range  ? Sodium 141 135 - 145 mEq/L  ? Potassium 3.9 3.5 - 5.1 mEq/L  ? Chloride 103 96 - 112 mEq/L  ? CO2 31 19 - 32 mEq/L  ? Glucose, Bld 81 70 - 99 mg/dL  ? BUN 9 6 - 23 mg/dL  ? Creatinine, Ser 0.95 0.40 - 1.20 mg/dL  ? Total Bilirubin 0.5 0.2 - 1.2 mg/dL  ? Alkaline Phosphatase 69 39 - 117 U/L  ? AST 16 0 - 37 U/L  ? ALT 13 0 - 35 U/L  ? Total Protein 7.1 6.0 - 8.3 g/dL  ? Albumin 4.1 3.5 - 5.2 g/dL  ? GFR 70.10 >60.00 mL/min  ? Calcium 9.6 8.4 - 10.5 mg/dL  ?CBC w/Diff  ?Result Value Ref Range  ? WBC 6.3 4.0 - 10.5 K/uL  ? RBC 4.22 3.87 - 5.11 Mil/uL  ? Hemoglobin 12.6 12.0 - 15.0 g/dL  ? HCT 38.2 36.0 - 46.0 %  ? MCV 90.5 78.0 - 100.0 fl  ? MCHC 33.1 30.0 - 36.0 g/dL  ? RDW 12.8 11.5 - 15.5 %  ? Platelets 169.0 150.0 - 400.0 K/uL  ? Neutrophils Relative % 73.7 43.0 - 77.0 %  ? Lymphocytes Relative 16.7 12.0 - 46.0 %  ? Monocytes Relative 6.9 3.0 - 12.0 %  ? Eosinophils Relative 1.6 0.0 - 5.0 %  ? Basophils Relative 1.1 0.0 - 3.0 %  ? Neutro Abs 4.7 1.4 - 7.7 K/uL  ? Lymphs Abs 1.1 0.7 - 4.0 K/uL  ? Monocytes Absolute 0.4 0.1 - 1.0 K/uL  ? Eosinophils Absolute 0.1 0.0 - 0.7 K/uL  ? Basophils Absolute 0.1 0.0 - 0.1 K/uL  ?TSH  ?Result Value Ref Range  ? TSH 1.53 0.35 -  5.50 uIU/mL  ? ?   ?Assessment & Plan:  ? ?Problem List Items Addressed This Visit   ? ?  ? Respiratory  ? Moderate persistent asthma  ? Relevant Medications  ? albuterol (VENTOLIN HFA) 108 (90 Base) MCG/ACT inhaler

## 2021-09-21 ENCOUNTER — Encounter: Payer: Self-pay | Admitting: Family Medicine

## 2021-09-21 ENCOUNTER — Ambulatory Visit (INDEPENDENT_AMBULATORY_CARE_PROVIDER_SITE_OTHER): Payer: BC Managed Care – PPO | Admitting: Family Medicine

## 2021-09-21 DIAGNOSIS — Z0000 Encounter for general adult medical examination without abnormal findings: Secondary | ICD-10-CM | POA: Diagnosis not present

## 2021-09-21 DIAGNOSIS — J4540 Moderate persistent asthma, uncomplicated: Secondary | ICD-10-CM | POA: Diagnosis not present

## 2021-09-21 MED ORDER — ALBUTEROL SULFATE HFA 108 (90 BASE) MCG/ACT IN AERS: 2.0000 | INHALATION_SPRAY | Freq: Four times a day (QID) | RESPIRATORY_TRACT | 2 refills | Status: DC | PRN

## 2021-09-22 LAB — COMPREHENSIVE METABOLIC PANEL
ALT: 10 U/L (ref 0–35)
AST: 15 U/L (ref 0–37)
Albumin: 4.2 g/dL (ref 3.5–5.2)
Alkaline Phosphatase: 59 U/L (ref 39–117)
BUN: 10 mg/dL (ref 6–23)
CO2: 31 mEq/L (ref 19–32)
Calcium: 9.3 mg/dL (ref 8.4–10.5)
Chloride: 101 mEq/L (ref 96–112)
Creatinine, Ser: 0.88 mg/dL (ref 0.40–1.20)
GFR: 76.76 mL/min (ref >60.00)
Glucose, Bld: 90 mg/dL (ref 70–99)
Potassium: 3.6 mEq/L (ref 3.5–5.1)
Sodium: 139 mEq/L (ref 135–145)
Total Bilirubin: 0.6 mg/dL (ref 0.2–1.2)
Total Protein: 7.1 g/dL (ref 6.0–8.3)

## 2021-09-22 LAB — LIPID PANEL
Cholesterol: 158 mg/dL (ref 0–200)
HDL: 78.7 mg/dL (ref >39.00)
LDL Cholesterol: 73 mg/dL (ref 0–99)
NonHDL: 79.6
Total CHOL/HDL Ratio: 2
Triglycerides: 31 mg/dL (ref 0.0–149.0)
VLDL: 6.2 mg/dL (ref 0.0–40.0)

## 2021-09-22 LAB — TSH: TSH: 0.61 u[IU]/mL (ref 0.35–5.50)

## 2021-09-22 LAB — CBC
HCT: 37 % (ref 36.0–46.0)
Hemoglobin: 12.5 g/dL (ref 12.0–15.0)
MCHC: 33.8 g/dL (ref 30.0–36.0)
MCV: 90.2 fl (ref 78.0–100.0)
Platelets: 143 10*3/uL — ABNORMAL LOW (ref 150.0–400.0)
RBC: 4.1 Mil/uL (ref 3.87–5.11)
RDW: 12.6 % (ref 11.5–15.5)
WBC: 6.3 10*3/uL (ref 4.0–10.5)

## 2021-10-24 ENCOUNTER — Ambulatory Visit (INDEPENDENT_AMBULATORY_CARE_PROVIDER_SITE_OTHER): Payer: BC Managed Care – PPO | Admitting: Pulmonary Disease

## 2021-10-24 ENCOUNTER — Encounter: Payer: Self-pay | Admitting: Pulmonary Disease

## 2021-10-24 VITALS — BP 118/60 | HR 69 | Temp 98.1°F | Ht 68.0 in | Wt 142.0 lb

## 2021-10-24 DIAGNOSIS — R0982 Postnasal drip: Secondary | ICD-10-CM | POA: Diagnosis not present

## 2021-10-24 DIAGNOSIS — R059 Cough, unspecified: Secondary | ICD-10-CM

## 2021-10-24 DIAGNOSIS — J4520 Mild intermittent asthma, uncomplicated: Secondary | ICD-10-CM | POA: Diagnosis not present

## 2021-10-24 LAB — PULMONARY FUNCTION TEST
DL/VA % pred: 125 %
DL/VA: 5.24 ml/min/mmHg/L
DLCO cor % pred: 122 %
DLCO cor: 29.47 ml/min/mmHg
DLCO unc % pred: 119 %
DLCO unc: 28.62 ml/min/mmHg
FEF 25-75 Post: 2.83 L/sec
FEF 25-75 Pre: 2.59 L/sec
FEF2575-%Change-Post: 9 %
FEF2575-%Pred-Post: 94 %
FEF2575-%Pred-Pre: 86 %
FEV1-%Change-Post: 1 %
FEV1-%Pred-Post: 98 %
FEV1-%Pred-Pre: 97 %
FEV1-Post: 3.16 L
FEV1-Pre: 3.12 L
FEV1FVC-%Change-Post: 7 %
FEV1FVC-%Pred-Pre: 93 %
FEV6-%Change-Post: -5 %
FEV6-%Pred-Post: 99 %
FEV6-%Pred-Pre: 105 %
FEV6-Post: 3.9 L
FEV6-Pre: 4.15 L
FEV6FVC-%Pred-Post: 102 %
FEV6FVC-%Pred-Pre: 102 %
FVC-%Change-Post: -5 %
FVC-%Pred-Post: 96 %
FVC-%Pred-Pre: 102 %
FVC-Post: 3.91 L
FVC-Pre: 4.15 L
Post FEV1/FVC ratio: 81 %
Post FEV6/FVC ratio: 100 %
Pre FEV1/FVC ratio: 75 %
Pre FEV6/FVC Ratio: 100 %
RV % pred: 22 %
RV: 0.44 L
TLC % pred: 84 %
TLC: 4.78 L

## 2021-10-24 NOTE — Patient Instructions (Signed)
Full PFT performed today. °

## 2021-10-24 NOTE — Progress Notes (Signed)
Full PFT performed today. °

## 2021-10-24 NOTE — Patient Instructions (Signed)
Your pulmonary function tests are normal. ? ?You can use symbicort inhaler as needed in the future if your symptoms of cough, wheezing, chest tightness or shortness of breath occur again. ? ?Follow up as needed. ?

## 2021-10-24 NOTE — Progress Notes (Signed)
? ?Synopsis: Referred in February 2023 for cough, bronchitis by Danise EdgeStacey Blyth, MD ? ?Subjective:  ? ?PATIENT ID: Gail Sanchez GENDER: female DOB: 1970/08/03, MRN: [id number] ? ?HPI ? ?Chief Complaint  ?Patient presents with  ? Follow-up  ?  Chest congestion. Productive cough daily but on consistent. Nasal congestion. No SOB or wheezing  ? ?Gail Sanchez is a 51 year old woman, never smoker who returns to pulmonary clinic for cough and bronchitis.  ? ?Her symbicort 160-4.545mcg was increased to 2 puffs twice daily from 1 puff twice daily at last visit. Her sinus congestion and post nasal drainage were treated with fluticasone and ipratropium nasal sprays. She reports resolution of her symptoms and she has been off her Symbicort inhaler for 1 month without any increase in her symptoms. She is not using nasal sprays at this time and denies any congestion or drainage. She continues to cough up mucous intermittently. She has started doing breathing exercises which she feels has helped with her symptoms. ? ?PFTs today are within normal limits.  ? ?OV 08/23/21 ?Patient reports having COVID-19 infection January 2022 with very mild symptoms.  After her infection she developed a cough that has persisted over the past year.  She reports intermittent sinus congestion with postnasal drainage along with shortness of breath and wheezing.  She has been evaluated at the [facility] where she was treated with azithromycin 250 mg daily for 30 days along with Flonase nasal spray and ipratropium nasal spray.  She noted some mild improvement but never resolution of her symptoms.  She never used the Flonase and ipratropium at the same time.  She has been treated with multiple rounds of other antibiotics and steroid tapers with improvement of her cough but then her symptoms are quick to return.  Prior to her COVID infection she did not experience any of the symptoms. ? ?She is currently using Symbicort  160-4.5 mcg 1 puff twice daily.  She continues to have sinus congestion and postnasal drainage.  She does not report any overt heartburn or reflux symptoms.  She was started empirically on famotidine therapy by her primary care but she is self discontinued this medication as it caused indigestion. ? ?She denies any nighttime awakenings with cough, wheezing or shortness of breath.  She denies any seasonal allergy. ? ?She is a never smoker.  She works as a Engineer, building servicesspeech therapy assistant.  She denies any harmful dust or chemical exposures. ? ?Past Medical History:  ?Diagnosis Date  ? Allergic state 06/09/2012  ? Anxiety 06/25/2017  ? Chicken pox as a child  ? Fatigue 09/28/2011  ? Low back pain 05/28/2015  ? Mild intermittent asthma without complication 03/03/2021  ? Onychomycosis 05/27/2016  ? Preventative health care 06/09/2012  ? 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 Vaginitis 12/27/2011  ?  ? ?Family History  ?Problem Relation Age of Onset  ? Cancer Mother   ?     metastatic  ? Eczema Mother   ? Hypertension Father   ? Blindness Father   ? Glaucoma Father   ? Retinal detachment Father   ? Asthma Brother   ? Dementia Paternal Grandmother   ? COPD Paternal Grandfather   ? Allergic rhinitis Neg Hx   ? Angioedema Neg Hx   ? Immunodeficiency Neg Hx   ? Urticaria Neg Hx   ? Colon cancer Neg Hx   ? Esophageal cancer Neg Hx   ?  Stomach cancer Neg Hx   ? Rectal cancer Neg Hx   ?  ? ?Social History  ? ?Socioeconomic History  ? Marital status: Married  ?  Spouse name: Not on file  ? Number of children: Not on file  ? Years of education: Not on file  ? Highest education level: Not on file  ?Occupational History  ? Occupation: Works with school   ?Tobacco Use  ? Smoking status: Never  ? Smokeless tobacco: Never  ?Vaping Use  ? Vaping Use: Never used  ?Substance and Sexual Activity  ? Alcohol use: Yes  ?  Comment: occ  ? Drug use: No  ? Sexual activity: Yes  ?  Birth control/protection: Other-see comments  ?   Comment: husband with vasectomy  ?Other Topics Concern  ? Not on file  ?Social History Narrative  ? Not on file  ? ?Social Determinants of Health  ? ?Financial Resource Strain: Not on file  ?Food Insecurity: Not on file  ?Transportation Needs: Not on file  ?Physical Activity: Not on file  ?Stress: Not on file  ?Social Connections: Not on file  ?Intimate Partner Violence: Not on file  ?  ? ?No Known Allergies  ? ?Outpatient Medications Prior to Visit  ?Medication Sig Dispense Refill  ? albuterol (VENTOLIN HFA) 108 (90 Base) MCG/ACT inhaler Inhale 2 puffs into the lungs every 6 (six) hours as needed for wheezing or shortness of breath. (Patient not taking: Reported on 10/24/2021) 18 g 2  ? budesonide-formoterol (SYMBICORT) 160-4.5 MCG/ACT inhaler Inhale 2 puffs into the lungs 2 (two) times daily. (Patient not taking: Reported on 10/24/2021) 1 each 3  ? desloratadine (CLARINEX) 5 MG tablet Take 1 tablet (5 mg total) by mouth 2 (two) times daily as needed. 180 tablet 1  ? fluticasone (FLONASE) 50 MCG/ACT nasal spray Place 1-2 sprays into both nostrils daily. 48 mL 1  ? methocarbamol (ROBAXIN) 500 MG tablet Take 1 tablet (500 mg total) by mouth every 6 (six) hours as needed for muscle spasms. 30 tablet 1  ? Multiple Vitamin (MULTIVITAMIN) tablet Take 1 tablet by mouth daily.    ? Spacer/Aero-Holding Rudean Curt Use as directed with inhaler 1 Device 0  ? ?No facility-administered medications prior to visit.  ? ?Review of Systems  ?Constitutional:  Negative for chills, fever, malaise/fatigue and weight loss.  ?HENT:  Negative for congestion, sinus pain and sore throat.   ?Eyes: Negative.   ?Respiratory:  Positive for cough and sputum production. Negative for hemoptysis, shortness of breath and wheezing.   ?Cardiovascular:  Negative for chest pain, palpitations, orthopnea, claudication and leg swelling.  ?Gastrointestinal:  Negative for abdominal pain, heartburn, nausea and vomiting.  ?Genitourinary: Negative.    ?Musculoskeletal:  Negative for joint pain and myalgias.  ?Skin:  Negative for rash.  ?Neurological:  Negative for weakness.  ?Endo/Heme/Allergies: Negative.   ?Psychiatric/Behavioral: Negative.    ? ?Objective:  ? ?Vitals:  ? 10/24/21 1513  ?BP: 118/60  ?Pulse: 69  ?Temp: 98.1 ?F (36.7 ?C)  ?TempSrc: Oral  ?SpO2: 98%  ?Weight: 142 lb (64.4 kg)  ?Height: 5\' 8"  (1.727 m)  ? ?Physical Exam ?Constitutional:   ?   General: She is not in acute distress. ?   Appearance: She is not ill-appearing.  ?HENT:  ?   Head: Normocephalic and atraumatic.  ?Eyes:  ?   General: No scleral icterus. ?   Conjunctiva/sclera: Conjunctivae normal.  ?Cardiovascular:  ?   Rate and Rhythm: Normal rate and regular rhythm.  ?  Pulses: Normal pulses.  ?   Heart sounds: Normal heart sounds. No murmur heard. ?Pulmonary:  ?   Effort: Pulmonary effort is normal.  ?   Breath sounds: No wheezing, rhonchi or rales.  ?Musculoskeletal:  ?   Right lower leg: No edema.  ?   Left lower leg: No edema.  ?Skin: ?   General: Skin is warm and dry.  ?Neurological:  ?   General: No focal deficit present.  ?   Mental Status: She is alert.  ?Psychiatric:     ?   Mood and Affect: Mood normal.     ?   Behavior: Behavior normal.     ?   Thought Content: Thought content normal.     ?   Judgment: Judgment normal.  ? ?CBC ?   ?Component Value Date/Time  ? WBC 6.3 09/21/2021 1550  ? RBC 4.10 09/21/2021 1550  ? HGB 12.5 09/21/2021 1550  ? HCT 37.0 09/21/2021 1550  ? PLT 143.0 (L) 09/21/2021 1550  ? MCV 90.2 09/21/2021 1550  ? MCH 29.6 02/15/2014 0936  ? MCHC 33.8 09/21/2021 1550  ? RDW 12.6 09/21/2021 1550  ? LYMPHSABS 1.1 07/31/2021 1612  ? MONOABS 0.4 07/31/2021 1612  ? EOSABS 0.1 07/31/2021 1612  ? BASOSABS 0.1 07/31/2021 1612  ? ? ?  Latest Ref Rng & Units 09/21/2021  ?  3:50 PM 07/31/2021  ?  4:12 PM 09/08/2020  ?  3:28 PM  ?BMP  ?Glucose 70 - 99 mg/dL 90   81   76    ?BUN 6 - 23 mg/dL 10   9   9     ?Creatinine 0.40 - 1.20 mg/dL   0.62   6.94    ?Sodium 135 - 145  mEq/L 139   141   140    ?Potassium 3.5 - 5.1 mEq/L 3.6   3.9   3.8    ?Chloride 96 - 112 mEq/L 101   103   102    ?CO2 19 - 32 mEq/L 31   31   30     ?Calcium 8.4 - 10.5 mg/dL 9.3   9.6   9.8    ? ?Chest imaging: ?CXR

## 2021-10-26 ENCOUNTER — Ambulatory Visit: Payer: BC Managed Care – PPO

## 2021-11-08 ENCOUNTER — Ambulatory Visit (INDEPENDENT_AMBULATORY_CARE_PROVIDER_SITE_OTHER): Payer: BC Managed Care – PPO

## 2021-11-08 DIAGNOSIS — Z23 Encounter for immunization: Secondary | ICD-10-CM | POA: Diagnosis not present

## 2021-11-08 DIAGNOSIS — Z0000 Encounter for general adult medical examination without abnormal findings: Secondary | ICD-10-CM

## 2021-11-08 NOTE — Progress Notes (Signed)
?  Gail Sanchez is a 51 y.o. female presents to the office today for Tdap injections,  ?per physician's orders. LD  was administered   ?Patient tolerated injection. ? ? ? ?[name] [name]  ?

## 2021-11-30 ENCOUNTER — Ambulatory Visit: Payer: BC Managed Care – PPO | Admitting: Family Medicine

## 2022-05-15 ENCOUNTER — Encounter: Payer: Self-pay | Admitting: Family Medicine

## 2022-05-15 ENCOUNTER — Ambulatory Visit: Payer: BC Managed Care – PPO | Admitting: Family Medicine

## 2022-05-15 VITALS — BP 136/60 | HR 84 | Temp 97.8°F | Ht 67.0 in | Wt 141.0 lb

## 2022-05-15 DIAGNOSIS — T7840XD Allergy, unspecified, subsequent encounter: Secondary | ICD-10-CM

## 2022-05-15 DIAGNOSIS — E876 Hypokalemia: Secondary | ICD-10-CM | POA: Diagnosis not present

## 2022-05-15 DIAGNOSIS — R002 Palpitations: Secondary | ICD-10-CM | POA: Diagnosis not present

## 2022-05-15 NOTE — Progress Notes (Signed)
Subjective:   By signing my name below, I, Shehryar Baig, attest that this documentation has been prepared under the direction and in the presence of Ann Held, DO. 05/15/2022    Patient ID: Gail Sanchez, female    DOB: May 17, 1971, 51 y.o.   MRN: [id number]  Chief Complaint  Patient presents with   Asthma    Has been having low potassium and heart palpitations    felt a cooling sensation near callor bone area    Happened on Wednesday and a few days after.     Asthma She complains of shortness of breath. There is no cough. Pertinent negatives include no chest pain, fever or headaches. Her past medical history is significant for asthma.   Patient is in today for a office visit.   She reports last week having a cold sensation across her chest and heart palpitations. She also felt fatigued easier and SOB after walking up and down her stairs. She's been experiencing these symptoms since contracted Covid-19 on January 2021. She has a history of mitral regurgitation. She though her potassium was low and starting eating foods rich in potassium. She found mild relief since but continues experiencing her symptoms.   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   Vaginitis 12/27/2011    Past Surgical History:  Procedure Laterality Date   cervix frozen      Family History  Problem Relation Age of Onset   Cancer Mother        metastatic   Eczema Mother    Hypertension Father    Blindness Father    Glaucoma Father    Retinal detachment Father    Asthma Brother    Dementia Paternal Grandmother    COPD Paternal Grandfather    Allergic rhinitis Neg Hx    Angioedema  Neg Hx    Immunodeficiency Neg Hx    Urticaria Neg Hx    Colon cancer Neg Hx    Esophageal cancer Neg Hx    Stomach cancer Neg Hx    Rectal cancer Neg Hx     Social History   Socioeconomic History   Marital status: Married    Spouse name: Not on file   Number of children: Not on file   Years of education: Not on file   Highest education level: Not on file  Occupational History   Occupation: Works with school   Tobacco Use   Smoking status: Never   Smokeless tobacco: Never  Vaping Use   Vaping Use: Never used  Substance and Sexual Activity   Alcohol use: Yes    Comment: occ   Drug use: No   Sexual activity: Yes    Birth control/protection: Other-see comments    Comment: husband with vasectomy  Other Topics Concern   Not on file  Social History Narrative   Not on file   Social Determinants of Health   Financial Resource Strain: Not on file  Food Insecurity: Not on file  Transportation Needs: Not on file  Physical Activity: Not on file  Stress: Not on file  Social Connections: Not on file  Intimate Partner Violence: Not on file  Outpatient Medications Prior to Visit  Medication Sig Dispense Refill   albuterol (VENTOLIN HFA) 108 (90 Base) MCG/ACT inhaler Inhale 2 puffs into the lungs every 6 (six) hours as needed for wheezing or shortness of breath. 18 g 2   budesonide-formoterol (SYMBICORT) 160-4.5 MCG/ACT inhaler Inhale 2 puffs into the lungs 2 (two) times daily. 1 each 3   desloratadine (CLARINEX) 5 MG tablet Take 1 tablet (5 mg total) by mouth 2 (two) times daily as needed. 180 tablet 1   fluticasone (FLONASE) 50 MCG/ACT nasal spray Place 1-2 sprays into both nostrils daily. 48 mL 1   methocarbamol (ROBAXIN) 500 MG tablet Take 1 tablet (500 mg total) by mouth every 6 (six) hours as needed for muscle spasms. 30 tablet 1   Multiple Vitamin (MULTIVITAMIN) tablet Take 1 tablet by mouth daily.     Spacer/Aero-Holding Rudean Curt Use as directed with inhaler 1  Device 0   No facility-administered medications prior to visit.    No Known Allergies  Review of Systems  Constitutional:  Negative for fever.       (+)cold sensations across chest  HENT:  Negative for congestion.   Eyes:  Negative for blurred vision.  Respiratory:  Positive for shortness of breath. Negative for cough.   Cardiovascular:  Positive for palpitations. Negative for chest pain.  Gastrointestinal:  Negative for vomiting.  Musculoskeletal:  Negative for back pain.  Skin:  Negative for rash.  Neurological:  Negative for loss of consciousness and headaches.       Objective:    Physical Exam Vitals and nursing note reviewed.  Constitutional:      General: She is not in acute distress.    Appearance: Normal appearance. She is not ill-appearing.  HENT:     Head: Normocephalic and atraumatic.     Right Ear: External ear normal.     Left Ear: External ear normal.  Eyes:     Extraocular Movements: Extraocular movements intact.     Pupils: Pupils are equal, round, and reactive to light.  Cardiovascular:     Rate and Rhythm: Normal rate. Rhythm irregular.     Heart sounds: Normal heart sounds. No murmur heard.    No gallop.  Pulmonary:     Effort: Pulmonary effort is normal. No respiratory distress.     Breath sounds: Normal breath sounds. No wheezing or rales.  Skin:    General: Skin is warm and dry.  Neurological:     Mental Status: She is alert and oriented to person, place, and time.  Psychiatric:        Judgment: Judgment normal.   EKG ---  pacs , sinus rhythm   BP 136/60   Pulse 84   Temp 97.8 F (36.6 C) (Oral)   Ht 5\' 7"  (1.702 m)   Wt 141 lb (64 kg)   LMP 04/22/2022   SpO2 97%   BMI 22.08 kg/m  Wt Readings from Last 3 Encounters:  05/15/22 141 lb (64 kg)  10/24/21 142 lb (64.4 kg)  09/21/21 135 lb 12.8 oz (61.6 kg)    Diabetic Foot Exam - Simple   No data filed    Lab Results  Component Value Date   WBC 6.4 05/15/2022   HGB 12.9  05/15/2022   HCT 38.4 05/15/2022   PLT 132.0 (L) 05/15/2022   GLUCOSE 77 05/15/2022   CHOL 158 09/21/2021   TRIG 31.0 09/21/2021   HDL 78.70 09/21/2021   LDLCALC 73 09/21/2021   ALT 10  05/15/2022   AST 15 05/15/2022   NA 136 05/15/2022   K 3.6 05/15/2022   CL 100 05/15/2022   CREATININE 0.96 05/15/2022   BUN 12 05/15/2022   CO2 28 05/15/2022   TSH 1.19 05/15/2022    Lab Results  Component Value Date   TSH 1.19 05/15/2022   Lab Results  Component Value Date   WBC 6.4 05/15/2022   HGB 12.9 05/15/2022   HCT 38.4 05/15/2022   MCV 90.6 05/15/2022   PLT 132.0 (L) 05/15/2022   Lab Results  Component Value Date   NA 136 05/15/2022   K 3.6 05/15/2022   CO2 28 05/15/2022   GLUCOSE 77 05/15/2022   BUN 12 05/15/2022   CREATININE 0.96 05/15/2022   BILITOT 0.6 05/15/2022   ALKPHOS 55 05/15/2022   AST 15 05/15/2022   ALT 10 05/15/2022   PROT 7.7 05/15/2022   ALBUMIN 4.4 05/15/2022   CALCIUM 9.7 05/15/2022   GFR 68.84 05/15/2022   Lab Results  Component Value Date   CHOL 158 09/21/2021   Lab Results  Component Value Date   HDL 78.70 09/21/2021   Lab Results  Component Value Date   LDLCALC 73 09/21/2021   Lab Results  Component Value Date   TRIG 31.0 09/21/2021   Lab Results  Component Value Date   CHOLHDL 2 09/21/2021   No results found for: "HGBA1C"     Assessment & Plan:   Problem List Items Addressed This Visit       Unprioritized   Palpitation - Primary   Relevant Orders   CBC with Differential/Platelet (Completed)   Comprehensive metabolic panel (Completed)   TSH (Completed)   Vitamin B12 (Completed)   EKG 12-Lead (Completed)   Allergy   Other Visit Diagnoses     Hypokalemia       Relevant Orders   CBC with Differential/Platelet (Completed)   Comprehensive metabolic panel (Completed)   TSH (Completed)   Vitamin B12 (Completed)   EKG 12-Lead (Completed)        No orders of the defined types were placed in this encounter.   IDonato Schultz, DO, personally preformed the services described in this documentation.  All medical record entries made by the scribe were at my direction and in my presence.  I have reviewed the chart and discharge instructions (if applicable) and agree that the record reflects my personal performance and is accurate and complete. 05/15/2022   I,Shehryar Baig,acting as a scribe for Donato Schultz, DO.,have documented all relevant documentation on the behalf of Donato Schultz, DO,as directed by  Donato Schultz, DO while in the presence of Donato Schultz, DO.   Donato Schultz, DO

## 2022-05-16 LAB — COMPREHENSIVE METABOLIC PANEL
ALT: 10 U/L (ref 0–35)
AST: 15 U/L (ref 0–37)
Albumin: 4.4 g/dL (ref 3.5–5.2)
Alkaline Phosphatase: 55 U/L (ref 39–117)
BUN: 12 mg/dL (ref 6–23)
CO2: 28 mEq/L (ref 19–32)
Calcium: 9.7 mg/dL (ref 8.4–10.5)
Chloride: 100 mEq/L (ref 96–112)
Creatinine, Ser: 0.96 mg/dL (ref 0.40–1.20)
GFR: 68.84 mL/min (ref >60.00)
Glucose, Bld: 77 mg/dL (ref 70–99)
Potassium: 3.6 mEq/L (ref 3.5–5.1)
Sodium: 136 mEq/L (ref 135–145)
Total Bilirubin: 0.6 mg/dL (ref 0.2–1.2)
Total Protein: 7.7 g/dL (ref 6.0–8.3)

## 2022-05-16 LAB — CBC WITH DIFFERENTIAL/PLATELET
Basophils Absolute: 0 10*3/uL (ref 0.0–0.1)
Basophils Relative: 0.6 % (ref 0.0–3.0)
Eosinophils Absolute: 0.1 10*3/uL (ref 0.0–0.7)
Eosinophils Relative: 1.1 % (ref 0.0–5.0)
HCT: 38.4 % (ref 36.0–46.0)
Hemoglobin: 12.9 g/dL (ref 12.0–15.0)
Lymphocytes Relative: 22.2 % (ref 12.0–46.0)
Lymphs Abs: 1.4 10*3/uL (ref 0.7–4.0)
MCHC: 33.6 g/dL (ref 30.0–36.0)
MCV: 90.6 fl (ref 78.0–100.0)
Monocytes Absolute: 0.4 10*3/uL (ref 0.1–1.0)
Monocytes Relative: 6.5 % (ref 3.0–12.0)
Neutro Abs: 4.5 10*3/uL (ref 1.4–7.7)
Neutrophils Relative %: 69.6 % (ref 43.0–77.0)
Platelets: 132 10*3/uL — ABNORMAL LOW (ref 150.0–400.0)
RBC: 4.23 Mil/uL (ref 3.87–5.11)
RDW: 12.2 % (ref 11.5–15.5)
WBC: 6.4 10*3/uL (ref 4.0–10.5)

## 2022-05-16 LAB — TSH: TSH: 1.19 u[IU]/mL (ref 0.35–5.50)

## 2022-05-16 LAB — VITAMIN B12: Vitamin B-12: 233 pg/mL (ref 211–911)

## 2022-06-11 ENCOUNTER — Other Ambulatory Visit: Payer: Self-pay | Admitting: Family Medicine

## 2022-07-10 DIAGNOSIS — S233XXA Sprain of ligaments of thoracic spine, initial encounter: Secondary | ICD-10-CM | POA: Diagnosis not present

## 2022-07-10 DIAGNOSIS — S338XXA Sprain of other parts of lumbar spine and pelvis, initial encounter: Secondary | ICD-10-CM | POA: Diagnosis not present

## 2022-07-10 DIAGNOSIS — S134XXA Sprain of ligaments of cervical spine, initial encounter: Secondary | ICD-10-CM | POA: Diagnosis not present

## 2022-07-18 ENCOUNTER — Other Ambulatory Visit: Payer: Self-pay

## 2022-07-18 DIAGNOSIS — S134XXA Sprain of ligaments of cervical spine, initial encounter: Secondary | ICD-10-CM | POA: Diagnosis not present

## 2022-07-18 DIAGNOSIS — S338XXA Sprain of other parts of lumbar spine and pelvis, initial encounter: Secondary | ICD-10-CM | POA: Diagnosis not present

## 2022-07-18 DIAGNOSIS — S233XXA Sprain of ligaments of thoracic spine, initial encounter: Secondary | ICD-10-CM | POA: Diagnosis not present

## 2022-07-18 DIAGNOSIS — J4540 Moderate persistent asthma, uncomplicated: Secondary | ICD-10-CM

## 2022-07-20 ENCOUNTER — Other Ambulatory Visit: Payer: Self-pay | Admitting: Family Medicine

## 2022-07-20 ENCOUNTER — Encounter: Payer: Self-pay | Admitting: Family Medicine

## 2022-07-20 MED ORDER — FEXOFENADINE HCL 180 MG PO TABS: 180.0000 mg | ORAL_TABLET | Freq: Every day | ORAL | 5 refills | Status: AC

## 2022-07-20 MED ORDER — FLUTICASONE PROPIONATE 50 MCG/ACT NA SUSP: 1.0000 | Freq: Every day | NASAL | 1 refills | Status: DC

## 2022-10-02 IMAGING — MG MM DIGITAL SCREENING BILAT W/ TOMO AND CAD
8 series · 9 of 24 positions shown · non-contrast
Comparison: Previous exam(s).

CLINICAL DATA: Screening.

EXAM:
DIGITAL SCREENING BILATERAL MAMMOGRAM WITH TOMOSYNTHESIS AND CAD
TECHNIQUE: Bilateral screening digital craniocaudal and mediolateral oblique
mammograms were obtained. Bilateral screening digital breast
tomosynthesis was performed. The images were evaluated with
computer-aided detection.

[R MLO synth-2D]
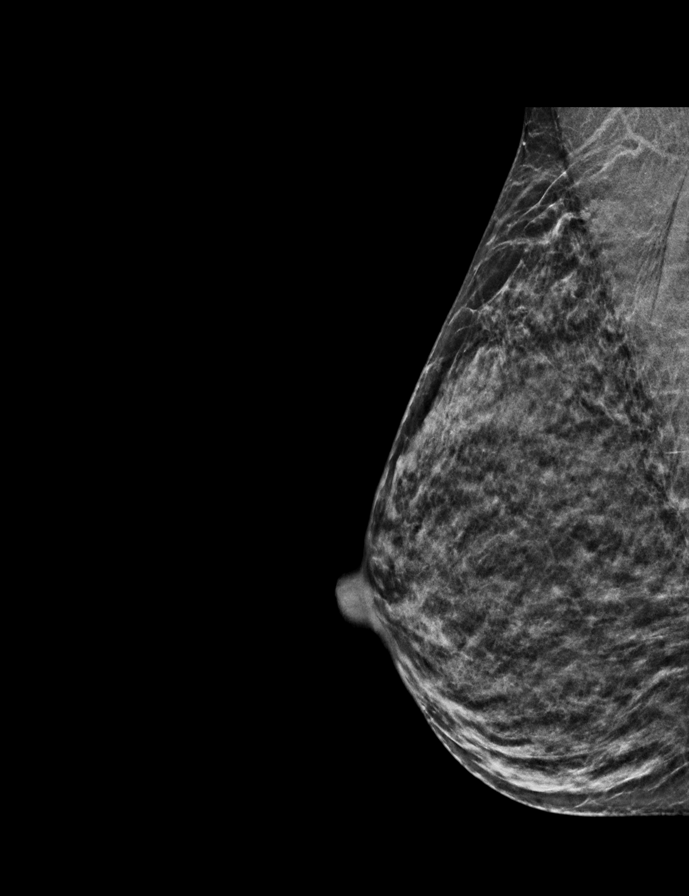

[R CC synth-2D]
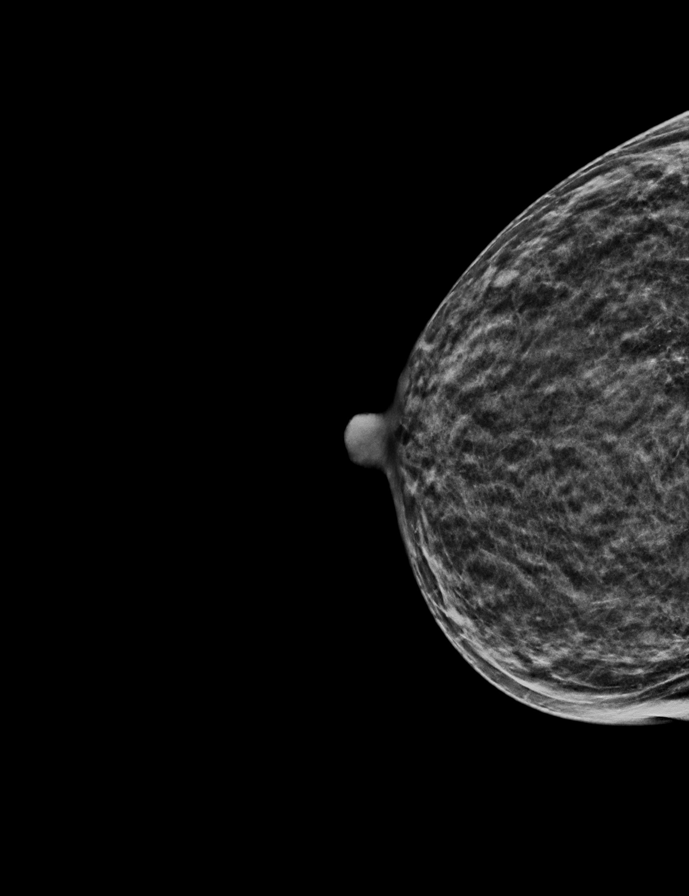

[L MLO synth-2D]
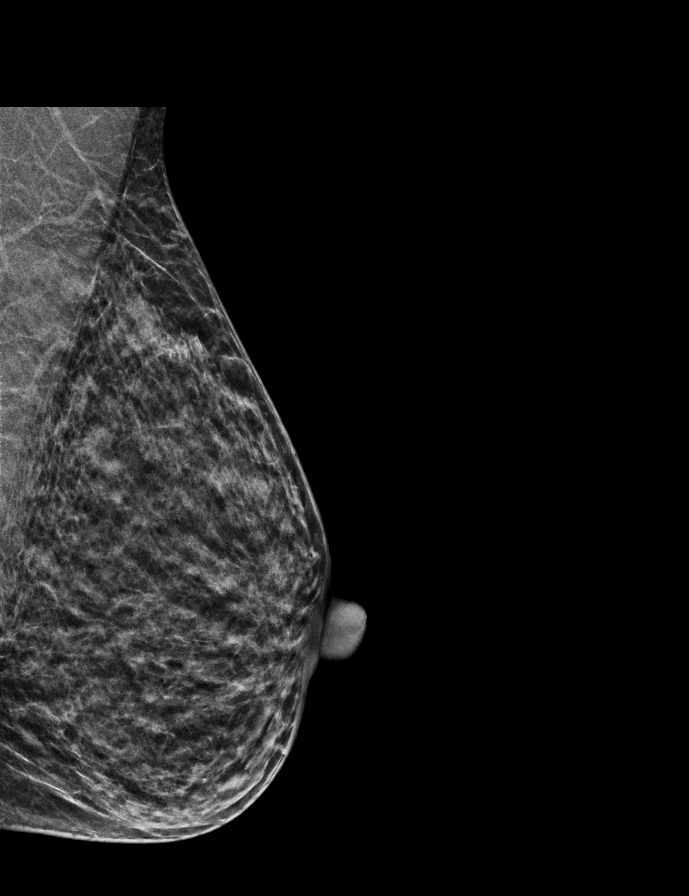

[L CC synth-2D]
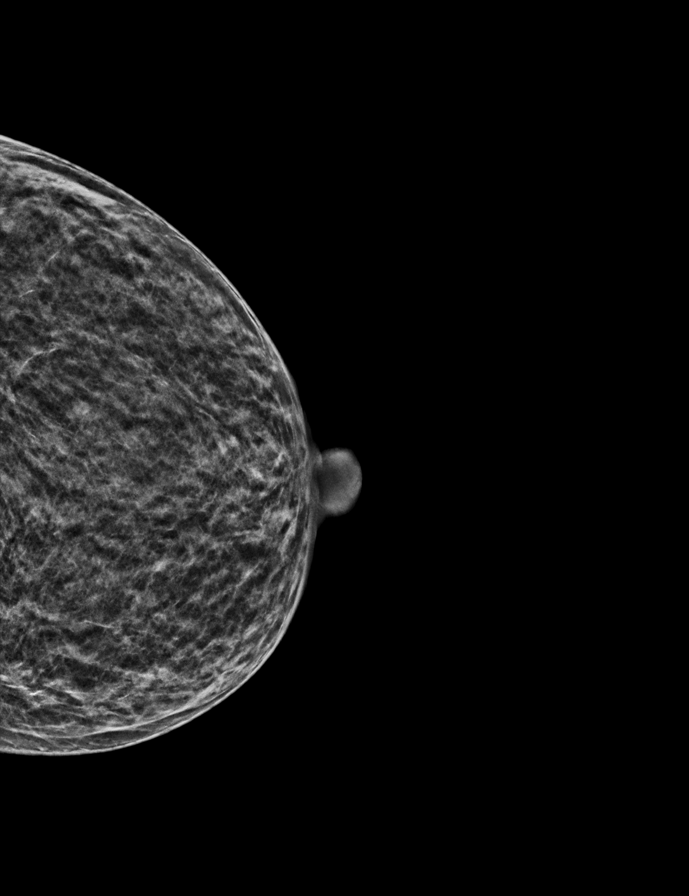

[R MLO tomo · 2 of 41 frames shown]
[frame 14/41]
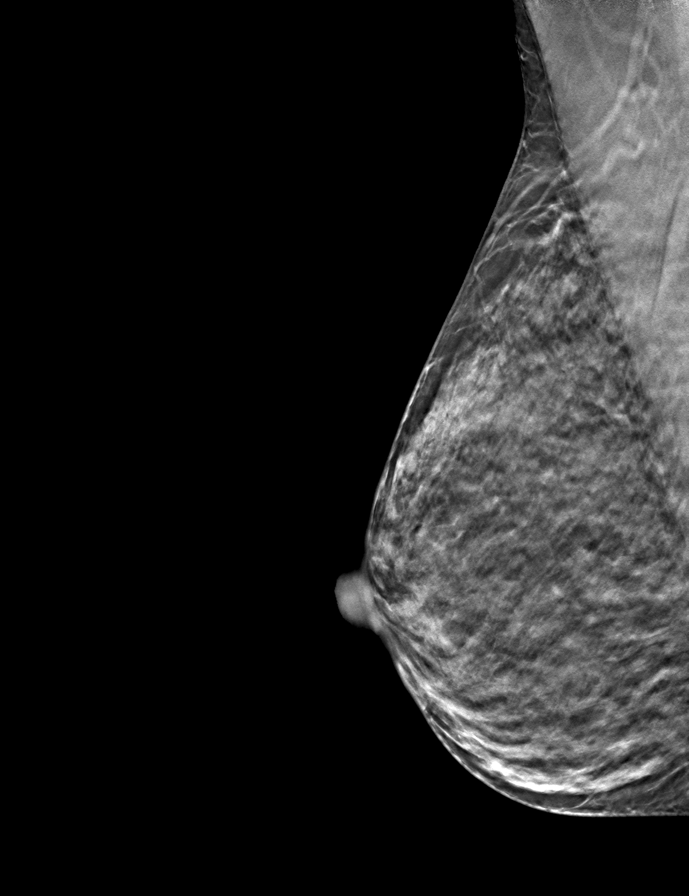
[frame 21/41]
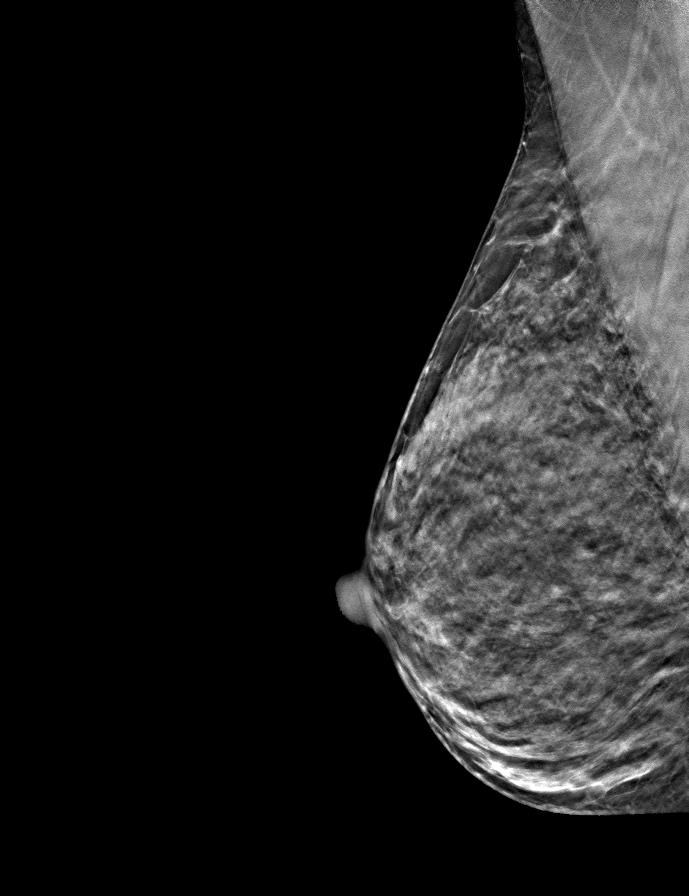

[L MLO tomo · tomo slice 19/36.0]
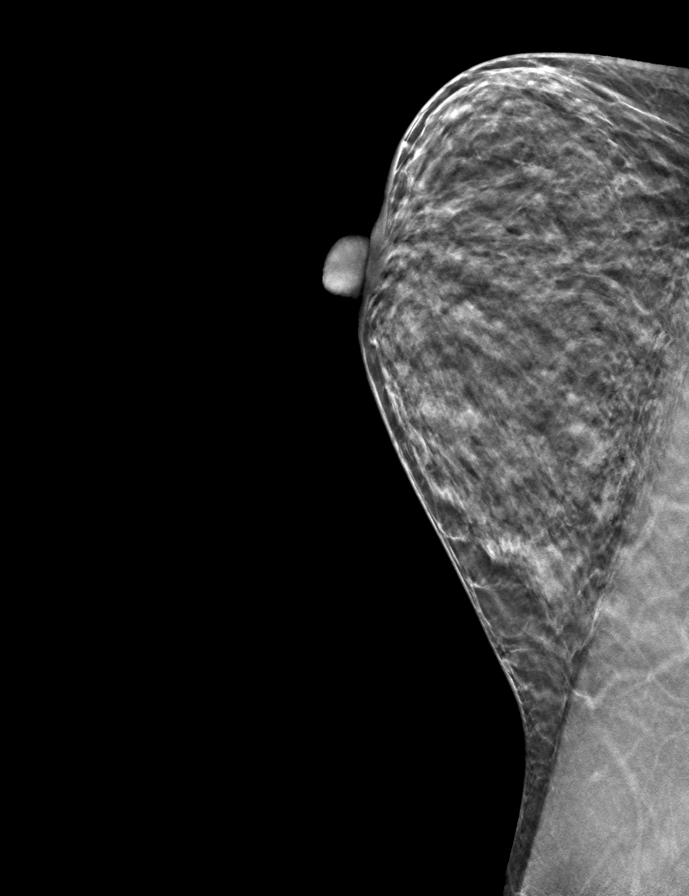

[R CC tomo · tomo slice 20/39.0]
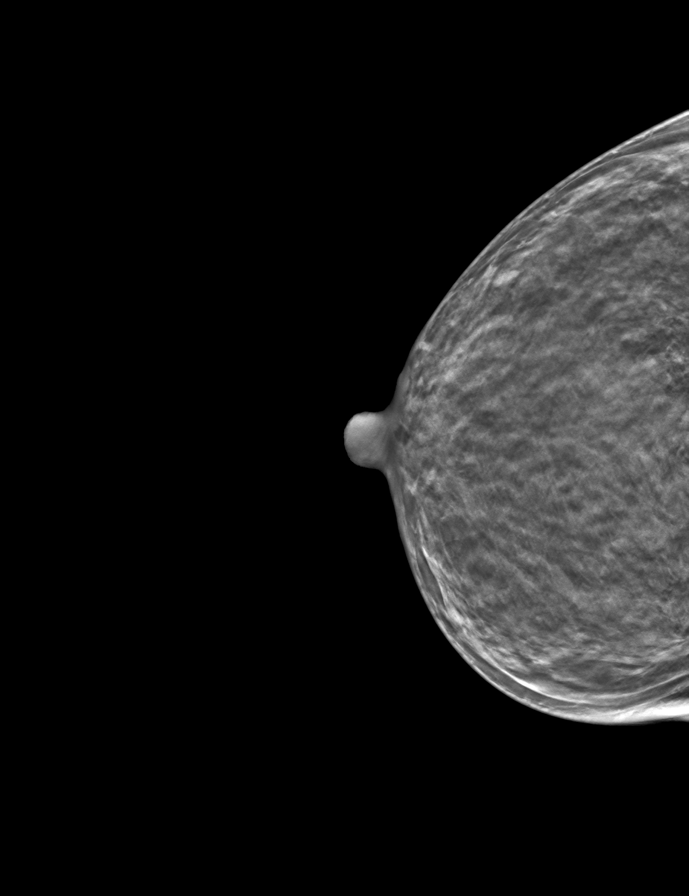

[L CC tomo · tomo slice 17/33.0]
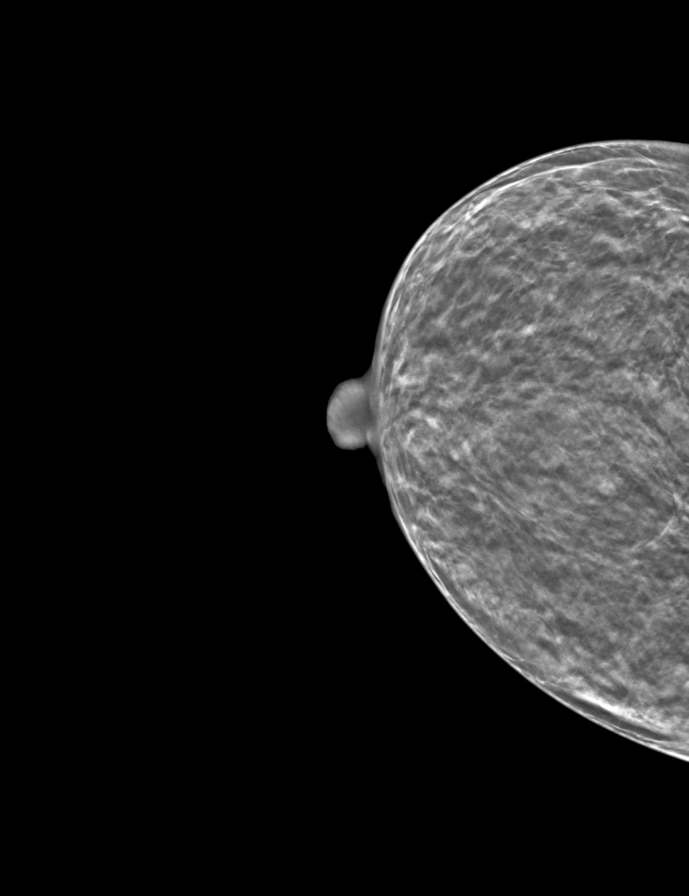

[9 of 24 positions shown; findings below may reference images not displayed]

ACR Breast Density Category d: The breast tissue is extremely dense,
which lowers the sensitivity of mammography
FINDINGS: There are no findings suspicious for malignancy.
IMPRESSION: No mammographic evidence of malignancy. A result letter of this
screening mammogram will be mailed directly to the patient.

RECOMMENDATION:
Screening mammogram in one year. (Code:[id number])

BI-RADS CATEGORY  1: Negative.

## 2022-10-09 DIAGNOSIS — S134XXA Sprain of ligaments of cervical spine, initial encounter: Secondary | ICD-10-CM | POA: Diagnosis not present

## 2022-10-09 DIAGNOSIS — S338XXA Sprain of other parts of lumbar spine and pelvis, initial encounter: Secondary | ICD-10-CM | POA: Diagnosis not present

## 2022-10-09 DIAGNOSIS — S233XXA Sprain of ligaments of thoracic spine, initial encounter: Secondary | ICD-10-CM | POA: Diagnosis not present

## 2022-11-21 DIAGNOSIS — Z6821 Body mass index (BMI) 21.0-21.9, adult: Secondary | ICD-10-CM | POA: Diagnosis not present

## 2022-11-21 DIAGNOSIS — B370 Candidal stomatitis: Secondary | ICD-10-CM | POA: Diagnosis not present

## 2022-11-28 ENCOUNTER — Other Ambulatory Visit (HOSPITAL_COMMUNITY): Payer: Self-pay | Admitting: Family Medicine

## 2022-11-28 DIAGNOSIS — Z1231 Encounter for screening mammogram for malignant neoplasm of breast: Secondary | ICD-10-CM

## 2022-12-11 ENCOUNTER — Encounter: Payer: BC Managed Care – PPO | Admitting: Family Medicine

## 2022-12-26 ENCOUNTER — Ambulatory Visit (HOSPITAL_COMMUNITY)
Admission: RE | Admit: 2022-12-26 | Discharge: 2022-12-26 | Disposition: A | Discharge status: Home / Self Care | Payer: 59 | Source: Ambulatory Visit | Attending: Family Medicine | Admitting: Family Medicine

## 2022-12-26 ENCOUNTER — Encounter (HOSPITAL_COMMUNITY): Payer: Self-pay

## 2022-12-26 ENCOUNTER — Inpatient Hospital Stay (HOSPITAL_COMMUNITY): Admission: RE | Admit: 2022-12-26 | Payer: Self-pay | Source: Ambulatory Visit

## 2022-12-26 DIAGNOSIS — Z1231 Encounter for screening mammogram for malignant neoplasm of breast: Secondary | ICD-10-CM | POA: Diagnosis not present

## 2022-12-31 DIAGNOSIS — M542 Cervicalgia: Secondary | ICD-10-CM | POA: Diagnosis not present

## 2022-12-31 DIAGNOSIS — M25552 Pain in left hip: Secondary | ICD-10-CM | POA: Diagnosis not present

## 2022-12-31 DIAGNOSIS — M545 Low back pain, unspecified: Secondary | ICD-10-CM | POA: Diagnosis not present

## 2022-12-31 DIAGNOSIS — M25551 Pain in right hip: Secondary | ICD-10-CM | POA: Diagnosis not present

## 2023-01-14 DIAGNOSIS — Z6820 Body mass index (BMI) 20.0-20.9, adult: Secondary | ICD-10-CM | POA: Diagnosis not present

## 2023-01-14 DIAGNOSIS — L700 Acne vulgaris: Secondary | ICD-10-CM | POA: Diagnosis not present

## 2023-01-14 DIAGNOSIS — R636 Underweight: Secondary | ICD-10-CM | POA: Diagnosis not present

## 2023-01-14 DIAGNOSIS — J45909 Unspecified asthma, uncomplicated: Secondary | ICD-10-CM | POA: Diagnosis not present

## 2023-01-14 DIAGNOSIS — Z008 Encounter for other general examination: Secondary | ICD-10-CM | POA: Diagnosis not present

## 2023-01-14 DIAGNOSIS — Z791 Long term (current) use of non-steroidal anti-inflammatories (NSAID): Secondary | ICD-10-CM | POA: Diagnosis not present

## 2023-01-21 DIAGNOSIS — M25551 Pain in right hip: Secondary | ICD-10-CM | POA: Diagnosis not present

## 2023-01-21 DIAGNOSIS — M545 Low back pain, unspecified: Secondary | ICD-10-CM | POA: Diagnosis not present

## 2023-01-21 DIAGNOSIS — M542 Cervicalgia: Secondary | ICD-10-CM | POA: Diagnosis not present

## 2023-01-21 DIAGNOSIS — M25552 Pain in left hip: Secondary | ICD-10-CM | POA: Diagnosis not present

## 2023-02-11 DIAGNOSIS — S233XXA Sprain of ligaments of thoracic spine, initial encounter: Secondary | ICD-10-CM | POA: Diagnosis not present

## 2023-02-11 DIAGNOSIS — S134XXA Sprain of ligaments of cervical spine, initial encounter: Secondary | ICD-10-CM | POA: Diagnosis not present

## 2023-02-11 DIAGNOSIS — S338XXA Sprain of other parts of lumbar spine and pelvis, initial encounter: Secondary | ICD-10-CM | POA: Diagnosis not present

## 2023-02-18 DIAGNOSIS — S134XXA Sprain of ligaments of cervical spine, initial encounter: Secondary | ICD-10-CM | POA: Diagnosis not present

## 2023-02-18 DIAGNOSIS — S338XXA Sprain of other parts of lumbar spine and pelvis, initial encounter: Secondary | ICD-10-CM | POA: Diagnosis not present

## 2023-02-18 DIAGNOSIS — S233XXA Sprain of ligaments of thoracic spine, initial encounter: Secondary | ICD-10-CM | POA: Diagnosis not present

## 2023-02-20 DIAGNOSIS — Z6820 Body mass index (BMI) 20.0-20.9, adult: Secondary | ICD-10-CM | POA: Diagnosis not present

## 2023-02-20 DIAGNOSIS — H00024 Hordeolum internum left upper eyelid: Secondary | ICD-10-CM | POA: Diagnosis not present

## 2023-04-18 ENCOUNTER — Encounter: Payer: BC Managed Care – PPO | Admitting: Family Medicine

## 2023-04-18 ENCOUNTER — Ambulatory Visit (INDEPENDENT_AMBULATORY_CARE_PROVIDER_SITE_OTHER): Payer: BC Managed Care – PPO | Admitting: Family Medicine

## 2023-04-18 ENCOUNTER — Encounter: Payer: Self-pay | Admitting: Family Medicine

## 2023-04-18 VITALS — BP 125/73 | HR 71 | Temp 97.8°F | Resp 16 | Ht 67.0 in | Wt 142.6 lb

## 2023-04-18 DIAGNOSIS — J4540 Moderate persistent asthma, uncomplicated: Secondary | ICD-10-CM

## 2023-04-18 DIAGNOSIS — Z23 Encounter for immunization: Secondary | ICD-10-CM | POA: Diagnosis not present

## 2023-04-18 DIAGNOSIS — Z0000 Encounter for general adult medical examination without abnormal findings: Secondary | ICD-10-CM | POA: Diagnosis not present

## 2023-04-18 MED ORDER — BUDESONIDE-FORMOTEROL FUMARATE 160-4.5 MCG/ACT IN AERO: 2.0000 | INHALATION_SPRAY | Freq: Two times a day (BID) | RESPIRATORY_TRACT | 3 refills | Status: AC

## 2023-04-18 MED ORDER — ALBUTEROL SULFATE HFA 108 (90 BASE) MCG/ACT IN AERS: 2.0000 | INHALATION_SPRAY | Freq: Four times a day (QID) | RESPIRATORY_TRACT | 2 refills | Status: AC | PRN

## 2023-04-18 NOTE — Addendum Note (Signed)
Addended by: Kathi Ludwig on: 04/18/2023 03:47 PM   Modules accepted: Orders

## 2023-04-18 NOTE — Progress Notes (Signed)
Complete physical exam  Patient: Gail Sanchez   DOB: Jul 23, 1970   52 y.o. Female  MRN: [id number]  Subjective:    Chief Complaint  Patient presents with   Annual Exam    Annual Exam    Gail Sanchez is a 53 y.o. female who presents today for a complete physical exam. She reports consuming a general diet. Home exercise routine includes treadmill, lift weight, jump rope. . She generally feels well. She reports sleeping well. She does not have additional problems to discuss today.   Currently lives with: husband and 3 kids Acute concerns or interim problems since last visit: no  Vision concerns: no Dental concerns: no   ETOH use: no Nicotine use: no Recreational drugs/illegal substances: no    Females:  She is currently  sexually active  Contraception choices are: husband with vasectomy LMP: 03/30/23      Most recent fall risk assessment:    04/18/2023    3:07 PM  Fall Risk   Falls in the past year? 0  Number falls in past yr: 0  Injury with Fall? 0  Follow up Falls evaluation completed     Most recent depression screenings:    04/18/2023    3:07 PM 05/15/2022    5:09 PM  PHQ 2/9 Scores  PHQ - 2 Score 0 0  PHQ- 9 Score 0             Patient Care Team: Bradd Canary, MD as PCP - General (Family Medicine) Wandalee Ferdinand, MD as Referring Physician (Obstetrics and Gynecology)   Outpatient Medications Prior to Visit  Medication Sig   fexofenadine (ALLEGRA ALLERGY) 180 MG tablet Take 1 tablet (180 mg total) by mouth daily.   fluticasone (FLONASE) 50 MCG/ACT nasal spray Place 1-2 sprays into both nostrils daily.   methocarbamol (ROBAXIN) 500 MG tablet Take 1 tablet (500 mg total) by mouth every 6 (six) hours as needed for muscle spasms.   Multiple Vitamin (MULTIVITAMIN) tablet Take 1 tablet by mouth daily.   Spacer/Aero-Holding Rudean Curt Use as directed with inhaler   [DISCONTINUED] albuterol (VENTOLIN HFA) 108 (90 Base) MCG/ACT  inhaler Inhale 2 puffs into the lungs every 6 (six) hours as needed for wheezing or shortness of breath.   [DISCONTINUED] SYMBICORT 160-4.5 MCG/ACT inhaler INHALE 2 PUFFS INTO THE LUNGS TWICE A DAY   No facility-administered medications prior to visit.    ROS All review of systems negative except what is listed in the HPI        Objective:     BP 125/73 (BP Location: Left Arm, Patient Position: Sitting, Cuff Size: Normal)   Pulse 71   Temp 97.8 F (36.6 C) (Oral)   Resp 16   Ht 5\' 7"  (1.702 m)   Wt 142 lb 9.6 oz (64.7 kg)   SpO2 99%   BMI 22.33 kg/m    Physical Exam Vitals reviewed.  Constitutional:      General: She is not in acute distress.    Appearance: Normal appearance. She is not ill-appearing.  HENT:     Head: Normocephalic and atraumatic.     Right Ear: Tympanic membrane normal.     Left Ear: Tympanic membrane normal.     Nose: Nose normal.     Mouth/Throat:     Mouth: Mucous membranes are moist.     Pharynx: Oropharynx is clear.  Eyes:     Extraocular Movements: Extraocular movements intact.     Conjunctiva/sclera: Conjunctivae normal.  Pupils: Pupils are equal, round, and reactive to light.  Cardiovascular:     Rate and Rhythm: Normal rate and regular rhythm.     Pulses: Normal pulses.     Heart sounds: Normal heart sounds.  Pulmonary:     Effort: Pulmonary effort is normal.     Breath sounds: Normal breath sounds.  Abdominal:     General: Abdomen is flat. Bowel sounds are normal. There is no distension.     Palpations: Abdomen is soft. There is no mass.     Tenderness: There is no abdominal tenderness. There is no right CVA tenderness, left CVA tenderness, guarding or rebound.  Genitourinary:    Comments: Deferred exam Musculoskeletal:        General: Normal range of motion.     Cervical back: Normal range of motion and neck supple. No tenderness.     Right lower leg: No edema.     Left lower leg: No edema.  Lymphadenopathy:     Cervical:  No cervical adenopathy.  Skin:    General: Skin is warm and dry.     Capillary Refill: Capillary refill takes less than 2 seconds.  Neurological:     General: No focal deficit present.     Mental Status: She is alert and oriented to person, place, and time. Mental status is at baseline.  Psychiatric:        Mood and Affect: Mood normal.        Behavior: Behavior normal.        Thought Content: Thought content normal.        Judgment: Judgment normal.         No results found for any visits on 04/18/23.     Assessment & Plan:    Routine Health Maintenance and Physical Exam Discussed health promotion and safety including diet and exercise recommendations, dental health, and injury prevention. Tobacco cessation if applicable. Seat belts, sunscreen, smoke detectors, etc.    Immunization History  Administered Date(s) Administered   Influenza Split 06/09/2012   Influenza Whole 04/09/2011   Influenza,inj,Quad PF,6+ Mos 05/17/2016, 05/06/2018, 03/28/2021   Influenza,inj,Quad PF,6-35 Mos 04/22/2017   MMR 05/10/2021   PFIZER(Purple Top)SARS-COV-2 Vaccination 09/13/2019, 10/04/2019   Td 07/10/2011   Tdap 03/28/2021, 05/10/2021, 11/08/2021    Health Maintenance  Topic Date Due   Zoster Vaccines- Shingrix (1 of 2) Never done   INFLUENZA VACCINE  02/07/2023   COVID-19 Vaccine (3 - 2023-24 season) 03/10/2023   Cervical Cancer Screening (HPV/Pap Cotest)  06/18/2023   MAMMOGRAM  12/25/2024   Colonoscopy  09/05/2031   DTaP/Tdap/Td (5 - Td or Tdap) 11/09/2031   Hepatitis C Screening  Completed   HIV Screening  Completed   HPV VACCINES  Aged Out        Problem List Items Addressed This Visit       Active Problems   Moderate persistent asthma    Stable. No acute concerns.  Refills provided      Relevant Medications   albuterol (VENTOLIN HFA) 108 (90 Base) MCG/ACT inhaler   budesonide-formoterol (SYMBICORT) 160-4.5 MCG/ACT inhaler   Other Visit Diagnoses     Annual  physical exam    -  Primary   Relevant Orders   CBC with Differential/Platelet   Comprehensive metabolic panel   Lipid panel   TSH      Return in about 1 year (around 04/17/2024) for physical.     Clayborne Dana, NP

## 2023-04-18 NOTE — Assessment & Plan Note (Signed)
Stable. No acute concerns.  Refills provided

## 2023-04-19 LAB — CBC WITH DIFFERENTIAL/PLATELET
Basophils Absolute: 0.1 K/uL (ref 0.0–0.1)
Basophils Relative: 1.3 % (ref 0.0–3.0)
Eosinophils Absolute: 0.1 K/uL (ref 0.0–0.7)
Eosinophils Relative: 1.2 % (ref 0.0–5.0)
HCT: 39.1 % (ref 36.0–46.0)
Hemoglobin: 13 g/dL (ref 12.0–15.0)
Lymphocytes Relative: 20.4 % (ref 12.0–46.0)
Lymphs Abs: 1.2 K/uL (ref 0.7–4.0)
MCHC: 33.3 g/dL (ref 30.0–36.0)
MCV: 91.5 fl (ref 78.0–100.0)
Monocytes Absolute: 0.4 K/uL (ref 0.1–1.0)
Monocytes Relative: 6.2 % (ref 3.0–12.0)
Neutro Abs: 4 K/uL (ref 1.4–7.7)
Neutrophils Relative %: 70.9 % (ref 43.0–77.0)
Platelets: 135 K/uL — ABNORMAL LOW (ref 150.0–400.0)
RBC: 4.27 Mil/uL (ref 3.87–5.11)
RDW: 12.1 % (ref 11.5–15.5)
WBC: 5.7 K/uL (ref 4.0–10.5)

## 2023-04-19 LAB — LIPID PANEL
Cholesterol: 183 mg/dL (ref 0–200)
HDL: 85.7 mg/dL (ref >39.00)
LDL Cholesterol: 90 mg/dL (ref 0–99)
NonHDL: 97.25
Total CHOL/HDL Ratio: 2
Triglycerides: 35 mg/dL (ref 0.0–149.0)
VLDL: 7 mg/dL (ref 0.0–40.0)

## 2023-04-19 LAB — COMPREHENSIVE METABOLIC PANEL WITH GFR
ALT: 13 U/L (ref 0–35)
AST: 17 U/L (ref 0–37)
Albumin: 4.3 g/dL (ref 3.5–5.2)
Alkaline Phosphatase: 58 U/L (ref 39–117)
BUN: 11 mg/dL (ref 6–23)
CO2: 30 meq/L (ref 19–32)
Calcium: 9.8 mg/dL (ref 8.4–10.5)
Chloride: 101 meq/L (ref 96–112)
Creatinine, Ser: 1.03 mg/dL (ref 0.40–1.20)
GFR: 62.85 mL/min
Glucose, Bld: 80 mg/dL (ref 70–99)
Potassium: 3.8 meq/L (ref 3.5–5.1)
Sodium: 139 meq/L (ref 135–145)
Total Bilirubin: 0.6 mg/dL (ref 0.2–1.2)
Total Protein: 6.8 g/dL (ref 6.0–8.3)

## 2023-04-19 LAB — TSH: TSH: 1.02 u[IU]/mL (ref 0.35–5.50)

## 2023-07-04 ENCOUNTER — Encounter: Payer: Self-pay | Admitting: Family Medicine

## 2023-07-04 ENCOUNTER — Ambulatory Visit: Payer: BC Managed Care – PPO | Admitting: Family Medicine

## 2023-07-04 VITALS — BP 120/74 | HR 75 | Temp 98.6°F | Resp 16 | Ht 69.0 in | Wt 141.0 lb

## 2023-07-04 DIAGNOSIS — R102 Pelvic and perineal pain: Secondary | ICD-10-CM

## 2023-07-04 DIAGNOSIS — R350 Frequency of micturition: Secondary | ICD-10-CM

## 2023-07-04 LAB — POC URINALSYSI DIPSTICK (AUTOMATED)
Bilirubin, UA: NEGATIVE
Blood, UA: NEGATIVE
Glucose, UA: NEGATIVE
Nitrite, UA: NEGATIVE
Protein, UA: NEGATIVE
Spec Grav, UA: 1.01 (ref 1.010–1.025)
Urobilinogen, UA: 0.2 U/dL
pH, UA: 7 (ref 5.0–8.0)

## 2023-07-04 NOTE — Patient Instructions (Signed)
Urinary Frequency, Adult Urinary frequency means urinating more often than usual. You may urinate every 1-2 hours even though you drink a normal amount of fluid and do not have a bladder infection or condition. Although you urinate more often than normal, the total amount of urine produced in a day is normal. With urinary frequency, you may have an urgent need to urinate often. The stress and anxiety of needing to find a bathroom quickly can make this urge worse. This condition may go away on its own, or you may need treatment at home. Home treatment may include bladder training, exercises, taking medicines, or making changes to your diet. Follow these instructions at home: Bladder health Your health care provider will tell you what to do to improve bladder health. You may be told to: Keep a bladder diary. Keep track of: What you eat and drink. How often you urinate. How much you urinate. Follow a bladder training program. This may include: Learning to delay going to the bathroom. Double urinating, also called voiding. This helps if you are not completely emptying your bladder. Scheduled voiding. Do Kegel exercises. Kegel exercises strengthen the muscles that help control urination, which may help the condition.  Eating and drinking Follow instructions from your health care provider about eating or drinking restrictions. You may be told to: Avoid caffeine. Drink fewer fluids, especially alcohol. Avoid drinking in the evening. Avoid foods or drinks that may irritate the bladder. These include coffee, tea, soda, artificial sweeteners, citrus, tomato-based foods, and chocolate. Eat foods that help prevent or treat constipation. Constipation can make urinary frequency worse. You may need to take these actions to prevent or treat constipation: Drink enough fluid to keep your urine pale yellow. Take over-the-counter or prescription medicines. Eat foods that are high in fiber, such as beans, whole  grains, and fresh fruits and vegetables. Limit foods that are high in fat and processed sugars, such as fried or sweet foods. General instructions Take over-the-counter and prescription medicines only as told by your health care provider. Keep all follow-up visits. This is important. Contact a health care provider if: You start urinating more often. You feel pain or irritation when you urinate. You notice blood in your urine. Your urine looks cloudy. You develop a fever. You begin vomiting. Get help right away if: You are unable to urinate. Summary Urinary frequency means urinating more often than usual. With urinary frequency, you may urinate every 1-2 hours even though you drink a normal amount of fluid and do not have a bladder infection or other bladder condition. Your health care provider may recommend that you keep a bladder diary, follow a bladder training program, or make dietary changes. If told by your health care provider, do Kegel exercises to strengthen the muscles that help control urination. Take over-the-counter and prescription medicines only as told by your health care provider. Contact a health care provider if your symptoms do not improve or get worse. This information is not intended to replace advice given to you by your health care provider. Make sure you discuss any questions you have with your health care provider. Document Revised: 01/29/2020 Document Reviewed: 01/29/2020 [employer] Patient Education  2024 [employer]  

## 2023-07-04 NOTE — Progress Notes (Signed)
Established Patient Office Visit  Subjective   Patient ID: Gail Sanchez, female    DOB: 1970-08-03  Age: 52 y.o. MRN: [id number]  Chief Complaint  Patient presents with   Urinary Frequency    Patient complains of urinary frequency for about a week. Had negative ua and culture at [facility].     HPI Discussed the use of AI scribe software for clinical note transcription with the patient, who gave verbal consent to proceed.  History of Present Illness   The patient presented with a chief complaint of frequent urination. She reported having a urine test at a minute clinic, which returned a negative culture. Despite the negative result, the patient continued to experience urinary frequency and associated abdominal discomfort, described as a dull ache similar to a stomach ache. The patient denied any dysuria or unusual odor associated with urination.  The patient noted a change in her urinary symptoms compared to previous years, with less distinct symptoms making it difficult to identify a urinary tract infection. She reported an increased need to urinate during her workday, despite no increase in fluid intake. The patient denied any incontinence issues, except for occasional leakage during jumping, which she attributed to childbirth.  The patient also reported a feeling of bloating and gassiness, particularly when experiencing the urinary symptoms. She wondered if these symptoms could be related to menopause, noting that her menstrual cycle had become somewhat irregular, with periods occurring closer together. The patient reported no new medications and confirmed she was still taking all prescribed medications.  The patient's mother had a history of carcinoid tumors, which were initially identified as fibroids and not treated. The patient had a history of abnormal Pap smears, with the most recent showing abnormal cells but a negative HPV result. The patient was advised to repeat the  Pap smear in one year. Given the family history and the patient's symptoms, an ultrasound was ordered to further investigate.      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   Personal history of urinary disorder 09/25/2010   Personal history presenting hazards to health 09/25/2010   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   Vaginitis 12/27/2011   Past Surgical History:  Procedure Laterality Date   cervix frozen     Social History   Tobacco Use   Smoking status: Never   Smokeless tobacco: Never  Vaping Use   Vaping status: Never Used  Substance Use Topics   Alcohol use: Yes    Comment: occ   Drug use: No   Social History   Socioeconomic History  Marital status: Married    Spouse name: Not on file   Number of children: Not on file   Years of education: Not on file   Highest education level: Not on file  Occupational History   Occupation: Works with school   Tobacco Use   Smoking status: Never   Smokeless tobacco: Never  Vaping Use   Vaping status: Never Used  Substance and Sexual Activity   Alcohol use: Yes     Comment: occ   Drug use: No   Sexual activity: Yes    Birth control/protection: Other-see comments    Comment: husband with vasectomy  Other Topics Concern   Not on file  Social History Narrative   Not on file   Social Drivers of Health   Financial Resource Strain: Low Risk  (06/25/2023)   Received from [facility]   Overall Financial Resource Strain (CARDIA)    Difficulty of Paying Living Expenses: Not hard at all  Food Insecurity: No Food Insecurity (06/25/2023)   Received from [facility] [place]   Hunger Vital Sign    Worried About Running Out of Food in the Last Year: Never true    Ran Out of Food in the Last Year: Never true  Transportation Needs: No Transportation Needs (06/25/2023)   Received from [facility] - Transportation    Lack of Transportation (Medical): No    Lack of Transportation (Non-Medical): No  Physical Activity: Not on file  Stress: Not on file  Social Connections: Unknown (11/18/2021)   Received from [facility]   Social Network    Social Network: Not on file  Intimate Partner Violence: Unknown (10/10/2021)   Received from [facility]   HITS    Physically Hurt: Not on file    Insult or Talk Down To: Not on file    Threaten Physical Harm: Not on file    Scream or Curse: Not on file   Family Status  Relation Name Status   Mother 67 Deceased at age 53   Father 19 Alive       8   Sister 42 Alive       43/ healthy   Brother 48 Alive       40   MGM 55 Deceased at age 48   MGF 44 Deceased at age 36   PGM 93 Deceased       82/ healthy   PGF 93 Deceased       89/ healthy   Daughter 58 Alive       84 year old healthy   Son 15,Christian Alive       8/ healthy   Son Cabin crew Alive       2 years healthy   Neg Hx  (Not Specified)  No partnership data on file   Family History  Problem Relation Age of Onset   Cancer Mother        metastatic   Eczema Mother    Hypertension Father    Blindness Father     Glaucoma Father    Retinal detachment Father    Asthma Brother    Dementia Paternal Grandmother    COPD Paternal Grandfather    Allergic rhinitis Neg Hx    Angioedema Neg Hx    Immunodeficiency Neg Hx    Urticaria Neg Hx    Colon cancer Neg Hx    Esophageal cancer Neg Hx    Stomach cancer Neg Hx    Rectal  cancer Neg Hx    No Known Allergies    Review of Systems  Constitutional:  Negative for fever and malaise/fatigue.  HENT:  Negative for congestion.   Eyes:  Negative for blurred vision.  Respiratory:  Negative for cough and shortness of breath.   Cardiovascular:  Negative for chest pain, palpitations and leg swelling.  Gastrointestinal:  Negative for abdominal pain, blood in stool, nausea and vomiting.  Genitourinary:  Negative for dysuria and frequency.  Musculoskeletal:  Negative for back pain and falls.  Skin:  Negative for rash.  Neurological:  Negative for dizziness, loss of consciousness and headaches.  Endo/Heme/Allergies:  Negative for environmental allergies.  Psychiatric/Behavioral:  Negative for depression. The patient is not nervous/anxious.       Objective:     BP 120/74 (BP Location: Left Arm, Patient Position: Sitting, Cuff Size: Small)   Pulse 75   Temp 98.6 F (37 C) (Oral)   Resp 16   Ht 5\' 9"  (1.753 m)   Wt 141 lb (64 kg)   SpO2 100%   BMI 20.82 kg/m  BP Readings from Last 3 Encounters:  07/04/23 120/74  04/18/23 125/73  05/15/22 136/60   Wt Readings from Last 3 Encounters:  07/04/23 141 lb (64 kg)  04/18/23 142 lb 9.6 oz (64.7 kg)  05/15/22 141 lb (64 kg)   SpO2 Readings from Last 3 Encounters:  07/04/23 100%  04/18/23 99%  05/15/22 97%      Physical Exam Vitals and nursing note reviewed.  Constitutional:      General: She is not in acute distress.    Appearance: Normal appearance. She is well-developed.  HENT:     Head: Normocephalic and atraumatic.  Eyes:     General: No scleral icterus.       Right eye: No discharge.         Left eye: No discharge.  Cardiovascular:     Rate and Rhythm: Normal rate and regular rhythm.     Heart sounds: No murmur heard. Pulmonary:     Effort: Pulmonary effort is normal. No respiratory distress.     Breath sounds: Normal breath sounds.  Abdominal:     Tenderness: There is abdominal tenderness in the suprapubic area. There is no guarding or rebound.    Musculoskeletal:        General: Normal range of motion.     Cervical back: Normal range of motion and neck supple.     Right lower leg: No edema.     Left lower leg: No edema.  Skin:    General: Skin is warm and dry.  Neurological:     Mental Status: She is alert and oriented to person, place, and time.  Psychiatric:        Mood and Affect: Mood normal.        Behavior: Behavior normal.        Thought Content: Thought content normal.        Judgment: Judgment normal.      Results for orders placed or performed in visit on 07/04/23  POCT Urinalysis Dipstick (Automated)  Result Value Ref Range   Color, UA yellow    Clarity, UA cloudy    Glucose, UA Negative Negative   Bilirubin, UA neg    Ketones, UA trace    Spec Grav, UA 1.010 1.010 - 1.025   Blood, UA neg    pH, UA 7.0 5.0 - 8.0   Protein, UA Negative Negative   Urobilinogen, UA  0.2 0.2 or 1.0 E.U./dL   Nitrite, UA neg    Leukocytes, UA Trace (A) Negative    Last CBC Lab Results  Component Value Date   WBC 5.7 04/18/2023   HGB 13.0 04/18/2023   HCT 39.1 04/18/2023   MCV 91.5 04/18/2023   MCH 29.6 02/15/2014   RDW 12.1 04/18/2023   PLT 135.0 (L) 04/18/2023   Last metabolic panel Lab Results  Component Value Date   GLUCOSE 80 04/18/2023   NA 139 04/18/2023   K 3.8 04/18/2023   CL 101 04/18/2023   CO2 30 04/18/2023   BUN 11 04/18/2023   CREATININE 1.03 04/18/2023   GFR 62.85 04/18/2023   CALCIUM 9.8 04/18/2023   PHOS 3.1 02/15/2014   PROT 6.8 04/18/2023   ALBUMIN 4.3 04/18/2023   BILITOT 0.6 04/18/2023   ALKPHOS 58 04/18/2023   AST  17 04/18/2023   ALT 13 04/18/2023   Last lipids Lab Results  Component Value Date   CHOL 183 04/18/2023   HDL 85.70 04/18/2023   LDLCALC 90 04/18/2023   TRIG 35.0 04/18/2023   CHOLHDL 2 04/18/2023   Last hemoglobin A1c No results found for: "HGBA1C" Last thyroid functions Lab Results  Component Value Date   TSH 1.02 04/18/2023   Last vitamin D Lab Results  Component Value Date   VD25OH 50.40 02/26/2020   Last vitamin B12 and Folate Lab Results  Component Value Date   VITAMINB12 233 05/15/2022      The ASCVD Risk score (Arnett DK, et al., 2019) failed to calculate for the following reasons:   Unable to determine if patient is Non-Hispanic African American    Assessment & Plan:   Problem List Items Addressed This Visit   None Visit Diagnoses       Urinary frequency    -  Primary   Relevant Orders   POCT Urinalysis Dipstick (Automated) (Completed)   Urine Culture   US Pelvic Complete With Transvaginal     Pelvic pain       Relevant Orders   US Pelvic Complete With Transvaginal     Assessment and Plan    Frequent Urination   She reports frequent urination without dysuria, odor, or discharge. The initial urine culture was not significant, but symptoms include dull abdominal pain and bloating. The differential diagnosis includes UTI, interstitial cystitis, or early menopausal changes. We discussed the importance of a clean catch technique for accurate culture results and the potential need for further investigation due to her family history of cancer. We will repeat the urine culture, educate her on the proper clean catch technique, and order an abdominal ultrasound considering her family history of cancer.  Menopausal Symptoms   She reports shorter menstrual cycles, bloating, and gassiness, indicating the possible onset of menopause. We discussed changes in menstrual cycle length and frequency. The menopausal transition may require management if symptoms worsen. We  will monitor her menstrual cycle and symptoms and consider further management if symptoms worsen.  General Health Maintenance   She has had abnormal Pap smears with negative HPV. The last Pap smear in 2023 showed abnormal cells but no follow-up was required due to negative HPV. We will schedule a routine Pap smear as per guidelines and monitor for new symptoms or changes.  Follow-up   We will schedule a follow-up appointment after the urine culture and ultrasound results are available.        Return if symptoms worsen or fail to improve.    Myrene Buddy  R Zola Button, DO

## 2023-07-05 ENCOUNTER — Ambulatory Visit: Payer: Self-pay | Admitting: Family Medicine

## 2023-07-05 ENCOUNTER — Encounter: Payer: Self-pay | Admitting: Family Medicine

## 2023-07-05 ENCOUNTER — Ambulatory Visit (HOSPITAL_BASED_OUTPATIENT_CLINIC_OR_DEPARTMENT_OTHER)
Admission: RE | Admit: 2023-07-05 | Discharge: 2023-07-05 | Disposition: A | Discharge status: Home / Self Care | Payer: BC Managed Care – PPO | Source: Ambulatory Visit | Attending: Family Medicine | Admitting: Family Medicine

## 2023-07-05 DIAGNOSIS — R102 Pelvic and perineal pain unspecified side: Secondary | ICD-10-CM

## 2023-07-05 DIAGNOSIS — R350 Frequency of micturition: Secondary | ICD-10-CM

## 2023-07-05 LAB — URINE CULTURE
MICRO NUMBER:: 15890702
Result:: NO GROWTH
SPECIMEN QUALITY:: ADEQUATE

## 2023-07-05 NOTE — Telephone Encounter (Signed)
Copied from CRM [id number]. Topic: Clinical - Pink Word Triage >> Jul 05, 2023 12:49 PM Gail Sanchez wrote: Reason for Triage: Patient states that the antibiotic that was prescribed upset her stomach and is requesting a different one.   Chief Complaint: Urinalysis Results and Medication Changes Symptoms: Frequency, Nausea Frequency: Ongoing Pertinent Negatives: Patient denies fever Disposition: [] ED /[] Urgent Care (no appt availability in office) / [] Appointment(In office/virtual)/ []  [facility]/ [] Home Care/ [] Refused Recommended Disposition /[] [facility]/ [x]  Follow-up with PCP Additional Notes: Gail Sanchez is a 52 year old female being triaged today regarding side effects and the results of her urinalysis. The current antibiotic has been making the patient nauseated, and the urine culture is still pending. The patient is requesting another antibiotic until the culture results.     Reason for Disposition  [1] POSITIVE urine test (i.e., NI + or LE + or WBC > 10) AND [2] NO standing order to call in prescription for antibiotic  Answer Assessment - Initial Assessment Questions 1. TEST: "What kind of urine test was performed?" (e.g., urinalysis, urine dipstick)      Urinalysis and Urine Culture  2. URINALYSIS RESULT: "Was it positive or negative?"  If positive, document what was positive (e.g., LE, WBC, RBC, bacteria, epithelial cells)     Positive Leukocytes  3. FEVER: "Do you have a fever?" If Yes, ask: "What is your temperature, how was it measured, and when did it start?"     No 4. FLANK PAIN: "Do you have any pain in your side?"     No  5. OTHER SYMPTOMS: "Do you have any other symptoms?" (e.g., blood in urine, vomiting)     Frequency, Nausea, Mid Back Pain (Dull Ache)  6. PREGNANCY: "Is there any chance you are pregnant?" "When was your last menstrual period?"     No to Pregnancy, 06/13/2023  7. PHENAZOPYRIDINE (Uristat, Pyridium): "Have you taken  phenazopyridine (turns your urine orange) recently?"     No  Protocols used: Urinalysis Results Follow-up Call-A-[facility]

## 2023-07-05 NOTE — Telephone Encounter (Signed)
Responded to Mychart message.

## 2023-07-05 NOTE — Telephone Encounter (Signed)
Copied from CRM [id number]. Topic: Clinical - Medical Advice >> Jul 05, 2023 10:12 AM Lorin Glass B wrote: Reason for CRM: Patient wants to confirm if she still needs ultrasound after having urine sample done. States that urine sample came back positive for UTI and she began prescribed antibiotics. She then took another urine sample test which was negative for UTI so she stopped the antibiotics as they also made her stomach hurt. Wants to confirm if she still needs the pelvic imaging done, and if she needs to start back on the antibiotics and if so, is there another type of antibiotic that will not hurt her stomach. Callback [phone]

## 2023-07-08 ENCOUNTER — Telehealth: Payer: Self-pay

## 2023-07-08 NOTE — Telephone Encounter (Signed)
Copied from CRM [id number]. Topic: Clinical - Lab/Test Results >> Jul 08, 2023 10:38 AM Josefa Half C wrote: Reason for CRM: Patient called in about sonogram results. The provider messaged the PT about calling in to discuss her results within 24 hours. Please give PT a callback to follow up [phone]

## 2023-07-09 NOTE — Telephone Encounter (Unsigned)
 Copied from CRM [id number]. Topic: General - Call Back - No Documentation >> Jul 09, 2023  8:08 AM Theodis Sato wrote: Reason for CRM: PT is requesting a call back from a nurse regarding her sonogram results.

## 2023-07-12 ENCOUNTER — Other Ambulatory Visit: Payer: Self-pay | Admitting: Family Medicine

## 2023-07-12 DIAGNOSIS — R109 Unspecified abdominal pain: Secondary | ICD-10-CM

## 2023-07-12 DIAGNOSIS — N8003 Adenomyosis of the uterus: Secondary | ICD-10-CM

## 2023-07-14 DIAGNOSIS — R350 Frequency of micturition: Secondary | ICD-10-CM | POA: Insufficient documentation

## 2023-07-14 NOTE — Assessment & Plan Note (Signed)
 Recent urine culture negative for infection but patient continues to have symptoms

## 2023-07-14 NOTE — Assessment & Plan Note (Signed)
 Encouraged moist heat and gentle stretching as tolerated. May try NSAIDs and prescription meds as directed and report if symptoms worsen or seek immediate care

## 2023-07-15 ENCOUNTER — Encounter: Payer: Self-pay | Admitting: Family Medicine

## 2023-07-15 ENCOUNTER — Other Ambulatory Visit: Payer: Self-pay | Admitting: Family Medicine

## 2023-07-15 ENCOUNTER — Telehealth (INDEPENDENT_AMBULATORY_CARE_PROVIDER_SITE_OTHER): Payer: Self-pay | Admitting: Family Medicine

## 2023-07-15 ENCOUNTER — Telehealth: Payer: Self-pay

## 2023-07-15 VITALS — BP 144/78 | HR 82

## 2023-07-15 DIAGNOSIS — M545 Low back pain, unspecified: Secondary | ICD-10-CM

## 2023-07-15 DIAGNOSIS — Z91018 Allergy to other foods: Secondary | ICD-10-CM

## 2023-07-15 DIAGNOSIS — T7840XD Allergy, unspecified, subsequent encounter: Secondary | ICD-10-CM

## 2023-07-15 DIAGNOSIS — K219 Gastro-esophageal reflux disease without esophagitis: Secondary | ICD-10-CM

## 2023-07-15 DIAGNOSIS — R350 Frequency of micturition: Secondary | ICD-10-CM

## 2023-07-15 NOTE — Telephone Encounter (Signed)
 Returned patients call to schedule new patient appointment. Per Dr. Macon Large patient will need a pap smear, and possible endometrial biopsy given her irregular periods.   Left voicemail for her to call back.

## 2023-07-15 NOTE — Progress Notes (Signed)
 MyChart Video Visit    Virtual Visit via Video Note   This patient is at least at moderate risk for complications without adequate follow up. This format is felt to be most appropriate for this patient at this time. Physical exam was limited by quality of the video and audio technology used for the visit. Juanetta, CMA was able to get the patient set up on a video visit.  Patient location: home Patient and provider in visit Provider location: Office  I discussed the limitations of evaluation and management by telemedicine and the availability of in person appointments. The patient expressed understanding and agreed to proceed.  Visit Date: 07/15/2023  Today's healthcare provider: Harlene Horton, MD     Subjective:    Patient ID: Gail Sanchez, female    DOB: 1971/05/08, 53 y.o.   MRN: [id number]  No chief complaint on file.   HPI Discussed the use of AI scribe software for clinical note transcription with the patient, who gave verbal consent to proceed.  History of Present Illness   The patient, with a history of food sensitivities and menopausal symptoms, presents with concerns about certain foods triggering symptoms. She has had a recent pelvic ultrasound which showed some nabothian cysts, ovarian cysts and adenomyosis. she is having mild symptoms but agrees to referral to GYN to have a biopsy completed for confirmation of benign diagnosis. They report that certain foods, such as peanuts and oranges, cause irritation, including sinus issues, puffiness around the eyes, dry lips, and difficulty breathing. They deny having any known allergies. They have been taking Bendeka and betaine hydrochloride, over-the-counter supplements, which have helped manage their symptoms.  The patient also discusses their experience with menopause, noting that it has hit them like a brick wall. They report no hot flashes but express concerns about the rapid physical changes associated with  menopause. They are interested in managing these changes without hormone replacement therapy (HRT) and inquire about potential supplements.        Past Medical History:  Diagnosis Date  . Allergic state 06/09/2012  . Anxiety 06/25/2017  . Chicken pox as a child  . Fatigue 09/28/2011  . Low back pain 05/28/2015  . Mild intermittent asthma without complication 03/03/2021  . Onychomycosis 05/27/2016  . Preventative health care 06/09/2012  . 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 Vaginitis 12/27/2011    Past Surgical History:  Procedure Laterality Date  . cervix frozen      Family History  Problem Relation Age of Onset  . Cancer Mother        metastatic  . Eczema Mother   . Hypertension Father   . Blindness Father   . Glaucoma Father   . Retinal detachment Father   . Asthma Brother   . Dementia Paternal Grandmother   . COPD Paternal Grandfather   . Allergic rhinitis Neg Hx   . Angioedema Neg Hx   . Immunodeficiency Neg Hx   . Urticaria Neg Hx   . Colon cancer Neg Hx   . Esophageal cancer Neg Hx   . Stomach cancer Neg Hx   . Rectal cancer Neg Hx     Social History   Socioeconomic History  . Marital status: Married    Spouse name: Not on file  . Number of children: Not on file  . Years of education: Not on file  . Highest education level: Not on file  Occupational History  . Occupation: Works with  school   Tobacco Use  . Smoking status: Never  . Smokeless tobacco: Never  Vaping Use  . Vaping status: Never Used  Substance and Sexual Activity  . Alcohol use: Yes    Comment: occ  . Drug use: No  . Sexual activity: Yes    Birth control/protection: Other-see comments    Comment: husband with vasectomy  Other Topics Concern  . Not on file  Social History Narrative  . Not on file   Social Drivers of Health   Financial Resource Strain: Low Risk  (06/25/2023)   Received from [facility]   Overall Financial Resource Strain (CARDIA)    . Difficulty of Paying Living Expenses: Not hard at all  Food Insecurity: No Food Insecurity (06/25/2023)   Received from [facility]   Hunger Vital Sign   . Worried About Programme Researcher, Broadcasting/film/video in the Last Year: Never true   . Ran Out of Food in the Last Year: Never true  Transportation Needs: No Transportation Needs (06/25/2023)   Received from [facility] - Transportation   . Lack of Transportation (Medical): No   . Lack of Transportation (Non-Medical): No  Physical Activity: Not on file  Stress: Not on file  Social Connections: Unknown (11/18/2021)   [facility], [facility]   Social Network   . Social Network: Not on file  Intimate Partner Violence: Unknown (10/10/2021)   Received from [facility], [facility]   HITS   . Physically Hurt: Not on file   . Insult or Talk Down To: Not on file   . Threaten Physical Harm: Not on file   . Scream or Curse: Not on file    Outpatient Medications Prior to Visit  Medication Sig Dispense Refill  . albuterol  (VENTOLIN  HFA) 108 (90 Base) MCG/ACT inhaler Inhale 2 puffs into the lungs every 6 (six) hours as needed for wheezing or shortness of breath. 18 g 2  . budesonide -formoterol  (SYMBICORT ) 160-4.5 MCG/ACT inhaler Inhale 2 puffs into the lungs 2 (two) times daily. 10.2 each 3  . fexofenadine  (ALLEGRA  ALLERGY) 180 MG tablet Take 1 tablet (180 mg total) by mouth daily. 30 tablet 5  . fluticasone  (FLONASE ) 50 MCG/ACT nasal spray PLACE 1-2 SPRAYS INTO BOTH NOSTRILS DAILY. 48 mL 1  . methocarbamol  (ROBAXIN ) 500 MG tablet Take 1 tablet (500 mg total) by mouth every 6 (six) hours as needed for muscle spasms. 30 tablet 1  . Multiple Vitamin (MULTIVITAMIN) tablet Take 1 tablet by mouth daily.    SABRA Spacer/Aero-Holding Raguel FRENCH Use as directed with inhaler 1 Device 0   No facility-administered medications prior to visit.    No Known Allergies  Review of Systems  Constitutional:  Positive for  malaise/fatigue. Negative for fever.  HENT:  Negative for congestion.   Eyes:  Negative for blurred vision.  Respiratory:  Negative for shortness of breath.   Cardiovascular:  Negative for chest pain, palpitations and leg swelling.  Gastrointestinal:  Negative for abdominal pain, blood in stool and nausea.  Genitourinary:  Negative for dysuria and frequency.  Musculoskeletal:  Positive for back pain, joint pain and myalgias. Negative for falls.  Skin:  Negative for rash.  Neurological:  Negative for dizziness, loss of consciousness and headaches.  Endo/Heme/Allergies:  Negative for environmental allergies.  Psychiatric/Behavioral:  Negative for depression. The patient is nervous/anxious.       Objective:    Physical Exam Constitutional:      General: She is not  in acute distress.    Appearance: Normal appearance. She is not ill-appearing or toxic-appearing.  HENT:     Head: Normocephalic and atraumatic.     Right Ear: External ear normal.     Left Ear: External ear normal.     Nose: Nose normal.  Eyes:     General:        Right eye: No discharge.        Left eye: No discharge.  Pulmonary:     Effort: Pulmonary effort is normal.  Skin:    Findings: No rash.  Neurological:     Mental Status: She is alert and oriented to person, place, and time.  Psychiatric:        Behavior: Behavior normal.   BP (!) 144/78   Pulse 82   SpO2 100%  Wt Readings from Last 3 Encounters:  07/04/23 141 lb (64 kg)  04/18/23 142 lb 9.6 oz (64.7 kg)  05/15/22 141 lb (64 kg)       Assessment & Plan:  Midline low back pain without sciatica, unspecified chronicity Assessment & Plan: Encouraged moist heat and gentle stretching as tolerated. May try NSAIDs and prescription meds as directed and report if symptoms worsen or seek immediate care    Urinary frequency Assessment & Plan: Recent urine culture negative for infection but patient continues to have symptoms   Food allergy -     Amb  ref to Medical Nutrition Therapy-MNT  Allergy, subsequent encounter -     Amb ref to Medical Nutrition Therapy-MNT  Gastroesophageal reflux disease, unspecified whether esophagitis present -     Amb ref to Medical Nutrition Therapy-MNT     Assessment and Plan    Food Sensitivities Reports of sinus congestion, dry eyes, and shortness of breath after consuming certain foods. No formal allergy testing has been done. Discussed the possibility of food sensitivities rather than allergies, and the potential role of pesticides on produce. -Referral to a nutritionist for guidance on an elimination diet. -Encouraged to keep a food and symptom diary.  Menopausal Symptoms Reports of dry mouth, dry eyes, and dry skin. Discussed the physiological changes that occur during menopause and the importance of hydration. -Encouraged to increase water intake. -Consideration of multivitamin with minerals, fatty acid, vitamin D , and a probiotic for overall health maintenance.  Adenomysosis: referred to gynecology for confirmative diagnosis and work up.         I discussed the assessment and treatment plan with the patient. The patient was provided an opportunity to ask questions and all were answered. The patient agreed with the plan and demonstrated an understanding of the instructions.   The patient was advised to call back or seek an in-person evaluation if the symptoms worsen or if the condition fails to improve as anticipated.  Harlene Horton, MD [facility] at [facility] [phone] (phone) [phone] (fax)  [facility]

## 2023-08-19 ENCOUNTER — Telehealth: Payer: Self-pay

## 2023-08-19 NOTE — Telephone Encounter (Signed)
 Called patient to confirm her appointment on 2/12 but left voicemail for her to call back. Informed her that if she doesn't confirm by tomorrow her appointment will be cancelled.

## 2023-08-21 ENCOUNTER — Ambulatory Visit: Payer: 59 | Admitting: Family Medicine

## 2023-08-21 ENCOUNTER — Other Ambulatory Visit (HOSPITAL_COMMUNITY)
Admission: RE | Admit: 2023-08-21 | Discharge: 2023-08-21 | Disposition: A | Discharge status: Home / Self Care | Payer: 59 | Source: Ambulatory Visit | Attending: Family Medicine | Admitting: Family Medicine

## 2023-08-21 VITALS — BP 115/75 | HR 83 | Ht 69.0 in | Wt 142.0 lb

## 2023-08-21 DIAGNOSIS — Z01419 Encounter for gynecological examination (general) (routine) without abnormal findings: Secondary | ICD-10-CM | POA: Insufficient documentation

## 2023-08-21 DIAGNOSIS — N920 Excessive and frequent menstruation with regular cycle: Secondary | ICD-10-CM

## 2023-08-21 DIAGNOSIS — N8003 Adenomyosis of the uterus: Secondary | ICD-10-CM | POA: Diagnosis not present

## 2023-08-21 DIAGNOSIS — Z1339 Encounter for screening examination for other mental health and behavioral disorders: Secondary | ICD-10-CM

## 2023-08-21 NOTE — Progress Notes (Signed)
ANNUAL EXAM Patient name: Gail Sanchez MRN [id number]  Date of birth: 17-Apr-1971 Chief Complaint:   Annual Exam  History of Present Illness:   Gail Sanchez is a 53 y.o.  G1P0  female  being seen today for a routine annual exam.  Current complaints: menses 24-25 day interval. Slightly heavy. Had US showing adenomyosis and 1.7cm cyst. Endometrial thickness 9mm.  Patient's last menstrual period was 08/01/2023 (exact date).    Last pap ASCUS HPV- 04/2022 Last mammogram: 12/2022. Results were: normal. Family h/o breast cancer: no Last colonoscopy: 08/2021     04/18/2023    3:07 PM 05/15/2022    5:09 PM 09/21/2021    3:26 PM 12/15/2020    3:52 PM 04/15/2020    9:28 AM  Depression screen PHQ 2/9  Decreased Interest 0 0 0 0 0  Down, Depressed, Hopeless 0 0 0 0 0  PHQ - 2 Score 0 0 0 0 0  Altered sleeping 0      Tired, decreased energy 0      Change in appetite 0      Feeling bad or failure about yourself  0      Trouble concentrating 0      Moving slowly or fidgety/restless 0      Suicidal thoughts 0      PHQ-9 Score 0      Difficult doing work/chores Not difficult at all            04/18/2023    3:08 PM  GAD 7 : Generalized Anxiety Score  Nervous, Anxious, on Edge 0  Control/stop worrying 0  Worry too much - different things 0  Trouble relaxing 0  Restless 0  Easily annoyed or irritable 0  Afraid - awful might happen 0  Total GAD 7 Score 0  Anxiety Difficulty Not difficult at all     Review of Systems:   Pertinent items are noted in HPI Denies any headaches, blurred vision, fatigue, shortness of breath, chest pain, abdominal pain, abnormal vaginal discharge/itching/odor/irritation, problems with periods, bowel movements, urination, or intercourse unless otherwise stated above. Pertinent History Reviewed:  Reviewed past medical,surgical, social and family history.  Reviewed problem list, medications and allergies. Physical Assessment:   Vitals:    08/21/23 0902  BP: 115/75  Pulse: 83  Weight: 142 lb (64.4 kg)  Height: 5\' 9"  (1.753 m)  Body mass index is 20.97 kg/m.        Physical Examination:   General appearance - well appearing, and in no distress  Mental status - alert, oriented to person, place, and time  Psych:  She has a normal mood and affect  Skin - warm and dry, normal color, no suspicious lesions noted  Chest - effort normal, all lung fields clear to auscultation bilaterally  Heart - normal rate and regular rhythm  Neck:  midline trachea, no thyromegaly or nodules  Breasts - deferred  Abdomen - soft, nontender, nondistended, no masses or organomegaly  Pelvic - VULVA: normal appearing vulva with no masses, tenderness or lesions  VAGINA: normal appearing vagina with normal color and discharge, no lesions  CERVIX: normal appearing cervix without discharge or lesions, no CMT  Thin prep pap is done with HR HPV cotesting  UTERUS: uterus is felt to be normal size, shape, consistency and nontender   ADNEXA: No adnexal masses or tenderness noted.  Extremities:  No swelling or varicosities noted  Chaperone present for exam  Assessment & Plan:  1. Well  woman exam with routine gynecological exam FHT normal - Cytology - PAP  2. Unusually frequent menses (Primary) Menses interval reduced - likely secondary to perimenopause and likely normal. Patient would like endometrial biopsy because of family history of cancer. See separate note. - Surgical pathology  3. Adenomyosis Discussed that this can cause heavy periods with dysmenorrhea. Discussed progesterone or IUD use to improve. She will think about this.   Labs/procedures today:    No orders of the defined types were placed in this encounter.   Meds: No orders of the defined types were placed in this encounter.   Follow-up: No follow-ups on file.  Levie Heritage, DO 08/21/2023 10:21 AM

## 2023-08-21 NOTE — Progress Notes (Signed)
ENDOMETRIAL BIOPSY     The indications for endometrial biopsy were reviewed.   Risks of the biopsy including cramping, bleeding, infection, uterine perforation, inadequate specimen and need for additional procedures  were discussed. The patient states she understands and agrees to undergo procedure today. Consent was signed. Time out was performed. Urine HCG was negative. A sterile speculum was placed in the patient's vagina and the cervix was prepped with Betadine. A single-toothed tenaculum was placed on the anterior lip of the cervix to stabilize it. The 3 mm pipelle was introduced into the endometrial cavity without difficulty to a depth of 10 cm, and a moderate amount of tissue was obtained and sent to pathology. The instruments were removed from the patient's vagina. Minimal bleeding from the cervix was noted. The patient tolerated the procedure well. Routine post-procedure instructions were given to the patient. The patient will follow up to review the results and for further management.

## 2023-08-21 NOTE — Addendum Note (Signed)
Addended by: Levie Heritage on: 08/21/2023 12:41 PM   Modules accepted: Level of Service

## 2023-08-23 ENCOUNTER — Encounter: Payer: Self-pay | Admitting: Family Medicine

## 2023-08-23 LAB — CYTOLOGY - PAP
Adequacy: ABSENT
Chlamydia: NEGATIVE
Comment: NEGATIVE
Comment: NEGATIVE
Comment: NEGATIVE
Comment: NEGATIVE
Comment: NORMAL
Diagnosis: NEGATIVE
HSV1: NEGATIVE
HSV2: NEGATIVE
High risk HPV: NEGATIVE
Neisseria Gonorrhea: NEGATIVE
Trichomonas: NEGATIVE

## 2023-08-23 LAB — SURGICAL PATHOLOGY

## 2023-09-03 ENCOUNTER — Ambulatory Visit: Payer: Self-pay | Admitting: Nutrition

## 2023-12-17 ENCOUNTER — Telehealth: Payer: Self-pay | Admitting: Family Medicine

## 2024-01-02 ENCOUNTER — Other Ambulatory Visit (HOSPITAL_COMMUNITY): Payer: Self-pay | Admitting: Family Medicine

## 2024-01-02 DIAGNOSIS — Z1231 Encounter for screening mammogram for malignant neoplasm of breast: Secondary | ICD-10-CM

## 2024-01-06 ENCOUNTER — Ambulatory Visit (HOSPITAL_COMMUNITY)
Admission: RE | Admit: 2024-01-06 | Discharge: 2024-01-06 | Disposition: A | Discharge status: Home / Self Care | Source: Ambulatory Visit | Attending: Family Medicine | Admitting: Family Medicine

## 2024-01-06 DIAGNOSIS — Z1231 Encounter for screening mammogram for malignant neoplasm of breast: Secondary | ICD-10-CM | POA: Diagnosis present

## 2024-05-10 NOTE — Assessment & Plan Note (Signed)
 Stable on current meds

## 2024-05-10 NOTE — Progress Notes (Signed)
 Subjective:    Patient ID: Gail Sanchez, female    DOB: [birth year], 53 y.o.   MRN: [id number]  Chief Complaint  Patient presents with   Annual Exam    Patient presents today for a physical exam.   Quality Metric Gaps    Hep B vaccines, zoster vaccines, pneumococcal vaccine    HPI Discussed the use of AI scribe software for clinical note transcription with the patient, who gave verbal consent to proceed.  History of Present Illness Gail Sanchez is a 53 year old female who presents for a routine follow-up and vaccination discussion.  She has completed her routine screenings this year, including a Pap smear, mammogram, and colonoscopy, all of which were normal. The colonoscopy was performed by Dr. Stacia and is scheduled to be repeated in 2033 unless symptoms arise.  She has not yet received the shingles vaccine. She inquires about the possibility of getting prescriptions for supplements such as probiotics and vitamin D3 with K2, as recommended by her chiropractor.  She has a history of slightly low platelet counts but has no symptoms such as bleeding gums or hematuria. She requests to have her vitamin D3 and B12 levels checked, although there is no documented history of deficiency.  She experiences some swelling, which she associates with prolonged sitting since returning to school. No family history of osteoporosis or changes in family medical history. 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  No chest pain, bowel changes, bleeding gums, hematuria, and reports no significant symptoms.    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   Vaginitis 12/27/2011    Past Surgical History:  Procedure Laterality Date   cervix frozen      Family History  Problem Relation Age of Onset   Cancer Mother        metastatic   Eczema Mother    Hypertension Father    Blindness Father    Glaucoma Father    Retinal detachment Father    Asthma Brother    Dementia Paternal Grandmother    COPD Paternal Grandfather    Allergic rhinitis Neg Hx    Angioedema Neg Hx    Immunodeficiency Neg Hx    Urticaria Neg Hx    Colon cancer Neg Hx    Esophageal cancer Neg Hx    Stomach cancer Neg Hx    Rectal cancer Neg Hx     Social History   Socioeconomic History   Marital status: Married    Spouse name: Not on file   Number of children: Not on file   Years of education: Not on file   Highest education level: Not on file  Occupational History   Occupation: Works with school   Tobacco Use   Smoking status: Never   Smokeless tobacco: Never  Vaping Use   Vaping status: Never Used  Substance and Sexual Activity   Alcohol use: Yes    Comment: occ   Drug use: No   Sexual activity: Yes    Birth control/protection: Other-see comments    Comment: husband with vasectomy  Other Topics Concern   Not  on file  Social History Narrative   Not on file   Social Drivers of Health   Financial Resource Strain: Low Risk  (06/25/2023)   Received from [facility]   Overall Financial Resource Strain (CARDIA)    Difficulty of Paying Living Expenses: Not hard at all  Food Insecurity: No Food Insecurity (06/25/2023)   Received from [facility]   Hunger Vital Sign    Within the past 12 months, you worried that your food would run out before you got the money to buy more.: Never true    Within the past 12 months, the food you bought just didn't last and you didn't have money to get more.: Never true  Transportation Needs: No Transportation Needs (06/25/2023)   Received from [facility] - Transportation     Lack of Transportation (Medical): No    Lack of Transportation (Non-Medical): No  Physical Activity: Not on file  Stress: Not on file  Social Connections: Unknown (11/18/2021)   Received from [facility]   Social Network    Social Network: Not on file  Intimate Partner Violence: Unknown (10/10/2021)   Received from [facility]   HITS    Physically Hurt: Not on file    Insult or Talk Down To: Not on file    Threaten Physical Harm: Not on file    Scream or Curse: Not on file    Outpatient Medications Prior to Visit  Medication Sig Dispense Refill   albuterol  (VENTOLIN  HFA) 108 (90 Base) MCG/ACT inhaler Inhale 2 puffs into the lungs every 6 (six) hours as needed for wheezing or shortness of breath. 18 g 2   budesonide -formoterol  (SYMBICORT ) 160-4.5 MCG/ACT inhaler Inhale 2 puffs into the lungs 2 (two) times daily. 10.2 each 3   fexofenadine  (ALLEGRA  ALLERGY) 180 MG tablet Take 1 tablet (180 mg total) by mouth daily. 30 tablet 5   fluticasone  (FLONASE ) 50 MCG/ACT nasal spray PLACE 1-2 SPRAYS INTO BOTH NOSTRILS DAILY. 48 mL 1   methocarbamol  (ROBAXIN ) 500 MG tablet Take 1 tablet (500 mg total) by mouth every 6 (six) hours as needed for muscle spasms. 30 tablet 1   Multiple Vitamin (MULTIVITAMIN) tablet Take 1 tablet by mouth daily.     Spacer/Aero-Holding Raguel FRENCH Use as directed with inhaler 1 Device 0   No facility-administered medications prior to visit.    No Known Allergies  Review of Systems  Constitutional:  Positive for malaise/fatigue. Negative for chills and fever.  HENT:  Negative for congestion and hearing loss.   Eyes:  Negative for discharge.  Respiratory:  Negative for cough, sputum production and shortness of breath.   Cardiovascular:  Positive for leg swelling. Negative for chest pain and palpitations.  Gastrointestinal:  Negative for abdominal pain, blood in stool, constipation, diarrhea, heartburn, nausea and vomiting.  Genitourinary:  Negative for dysuria,  frequency, hematuria and urgency.  Musculoskeletal:  Negative for back pain, falls and myalgias.  Skin:  Negative for rash.  Neurological:  Negative for dizziness, sensory change, loss of consciousness, weakness and headaches.  Endo/Heme/Allergies:  Negative for environmental allergies. Does not bruise/bleed easily.  Psychiatric/Behavioral:  Negative for depression and suicidal ideas. The patient is not nervous/anxious and does not have insomnia.        Objective:    Physical Exam Constitutional:      General: She is not in acute distress.    Appearance: Normal appearance. She is not diaphoretic.  HENT:     Head: Normocephalic and  atraumatic.     Right Ear: Tympanic membrane, ear canal and external ear normal.     Left Ear: Tympanic membrane, ear canal and external ear normal.     Nose: Nose normal.     Mouth/Throat:     Mouth: Mucous membranes are moist.     Pharynx: Oropharynx is clear. No oropharyngeal exudate.  Eyes:     General: No scleral icterus.       Right eye: No discharge.        Left eye: No discharge.     Conjunctiva/sclera: Conjunctivae normal.     Pupils: Pupils are equal, round, and reactive to light.  Neck:     Thyroid : No thyromegaly.  Cardiovascular:     Rate and Rhythm: Normal rate and regular rhythm.     Heart sounds: Normal heart sounds. No murmur heard. Pulmonary:     Effort: Pulmonary effort is normal. No respiratory distress.     Breath sounds: Normal breath sounds. No wheezing or rales.  Abdominal:     General: Bowel sounds are normal. There is no distension.     Palpations: Abdomen is soft. There is no mass.     Tenderness: There is no abdominal tenderness.  Musculoskeletal:        General: No tenderness. Normal range of motion.     Cervical back: Normal range of motion and neck supple.  Lymphadenopathy:     Cervical: No cervical adenopathy.  Skin:    General: Skin is warm and dry.     Findings: No rash.  Neurological:     General: No focal  deficit present.     Mental Status: She is alert and oriented to person, place, and time.     Cranial Nerves: No cranial nerve deficit.     Coordination: Coordination normal.     Deep Tendon Reflexes: Reflexes are normal and symmetric. Reflexes normal.  Psychiatric:        Mood and Affect: Mood normal.        Behavior: Behavior normal.        Thought Content: Thought content normal.        Judgment: Judgment normal.     BP 122/76   Pulse 74   Temp 97.8 F (36.6 C)   Resp 16   Ht 5' 9 (1.753 m)   Wt 146 lb (66.2 kg)   SpO2 98%   BMI 21.56 kg/m  Wt Readings from Last 3 Encounters:  05/14/24 146 lb (66.2 kg)  08/21/23 142 lb (64.4 kg)  07/04/23 141 lb (64 kg)    Diabetic Foot Exam - Simple   No data filed    Lab Results  Component Value Date   WBC 5.0 05/14/2024   HGB 12.6 05/14/2024   HCT 38.1 05/14/2024   PLT 133.0 (L) 05/14/2024   GLUCOSE 74 05/14/2024   CHOL 167 05/14/2024   TRIG 33.0 05/14/2024   HDL 74.90 05/14/2024   LDLCALC 85 05/14/2024   ALT 9 05/14/2024   AST 15 05/14/2024   NA 139 05/14/2024   K 3.6 05/14/2024   CL 102 05/14/2024   CREATININE 0.88 05/14/2024   BUN 9 05/14/2024   CO2 27 05/14/2024   TSH 0.88 05/14/2024    Lab Results  Component Value Date   TSH 0.88 05/14/2024   Lab Results  Component Value Date   WBC 5.0 05/14/2024   HGB 12.6 05/14/2024   HCT 38.1 05/14/2024   MCV 89.7 05/14/2024   PLT 133.0 (  L) 05/14/2024   Lab Results  Component Value Date   NA 139 05/14/2024   K 3.6 05/14/2024   CO2 27 05/14/2024   GLUCOSE 74 05/14/2024   BUN 9 05/14/2024   CREATININE 0.88 05/14/2024   BILITOT 0.7 05/14/2024   ALKPHOS 53 05/14/2024   AST 15 05/14/2024   ALT 9 05/14/2024   PROT 7.2 05/14/2024   ALBUMIN 4.2 05/14/2024   CALCIUM 9.2 05/14/2024   GFR 75.35 05/14/2024   Lab Results  Component Value Date   CHOL 167 05/14/2024   Lab Results  Component Value Date   HDL 74.90 05/14/2024   Lab Results  Component Value  Date   LDLCALC 85 05/14/2024   Lab Results  Component Value Date   TRIG 33.0 05/14/2024   Lab Results  Component Value Date   CHOLHDL 2 05/14/2024   No results found for: HGBA1C     Assessment & Plan:  Preventative health care Assessment & Plan: Patient encouraged to maintain heart healthy diet, regular exercise, adequate sleep. Consider daily probiotics. Take medications as prescribed. Colonoscopy 2023 repeat in 2033 Labs ordered and reviewed.  Pap 08/2023 MGM 12/2023  Orders: -     Lipid panel -     Comprehensive metabolic panel with GFR -     CBC with Differential/Platelet -     TSH -     Vitamin B12; Future  Allergy, subsequent encounter Assessment & Plan: Uses Allegra  and she believes that has helped. Use Flonase  and nasal saline daily   Orders: -     Vitamin B12; Future  Moderate persistent asthma without complication Assessment & Plan: Stable on current meds  Orders: -     VITAMIN D 25 Hydroxy (Vit-D Deficiency, Fractures) -     Vitamin B12; Future  Other fatigue -     VITAMIN D 25 Hydroxy (Vit-D Deficiency, Fractures) -     Vitamin B12; Future  Thrombocytopenia Assessment & Plan: Hydrate and monitor, asymptomatic   Need for influenza vaccination -     Flu vaccine trivalent PF, 6mos and older(Flulaval,Afluria,Fluarix,Fluzone)  Need for shingles vaccine -     Varicella-zoster vaccine IM  Other orders -     Probiotic Product; Take 1 capsule by mouth daily.  Dispense: 30 capsule; Refill: 11 -     Vitamin K2-Vitamin D3; Take 1 capsule by mouth daily.  Dispense: 30 capsule; Refill: 11    Assessment and Plan Assessment & Plan Adult Wellness Visit Routine adult wellness visit with no major issues. Recent Pap smear and mammogram were normal. Colonoscopy in 2023 was normal, next due in 2033 unless symptoms arise. Discussed shingles vaccine, reducing outbreak risk by 95% and dementia risk if outbreak occurs. Explained potential flu-like reaction  post-vaccination. Discussed insurance coverage for supplements and vitamins. - Administered first dose of shingles vaccine today. - Ordered thyroid  panel, metabolic panel, cholesterol panel, complete blood count, and platelet count. - Ordered vitamin D and B12 levels. - Advised on compression socks for lower extremity swelling. - Encouraged movement and elevation of legs to improve circulation.  Lower extremity swelling Mild lower extremity swelling likely due to prolonged sitting and aging. No significant concern unless accompanied by other symptoms like shortness of breath. - Advised use of compression socks. - Encouraged leg movement and elevation to improve circulation.  Fatigue No documented history of low vitamin D or B12 levels, but agreed to check levels due to insurance coverage concerns. - Ordered vitamin D and B12 levels.  Recording duration:  18 minutes     Harlene Horton, MD

## 2024-05-10 NOTE — Assessment & Plan Note (Addendum)
 Patient encouraged to maintain heart healthy diet, regular exercise, adequate sleep. Consider daily probiotics. Take medications as prescribed. Colonoscopy 2023 repeat in 2033 Labs ordered and reviewed.  Pap 08/2023 [facility] 12/2023

## 2024-05-10 NOTE — Assessment & Plan Note (Signed)
 Uses Allegra  and she believes that has helped. Use Flonase  and nasal saline daily

## 2024-05-14 ENCOUNTER — Other Ambulatory Visit (HOSPITAL_BASED_OUTPATIENT_CLINIC_OR_DEPARTMENT_OTHER): Payer: Self-pay

## 2024-05-14 ENCOUNTER — Encounter: Payer: Self-pay | Admitting: Family Medicine

## 2024-05-14 ENCOUNTER — Ambulatory Visit: Payer: Self-pay | Admitting: Family Medicine

## 2024-05-14 VITALS — BP 122/76 | HR 74 | Temp 97.8°F | Resp 16 | Ht 69.0 in | Wt 146.0 lb

## 2024-05-14 DIAGNOSIS — Z0000 Encounter for general adult medical examination without abnormal findings: Secondary | ICD-10-CM | POA: Diagnosis not present

## 2024-05-14 DIAGNOSIS — R5383 Other fatigue: Secondary | ICD-10-CM

## 2024-05-14 DIAGNOSIS — J4540 Moderate persistent asthma, uncomplicated: Secondary | ICD-10-CM | POA: Diagnosis not present

## 2024-05-14 DIAGNOSIS — Z23 Encounter for immunization: Secondary | ICD-10-CM | POA: Diagnosis not present

## 2024-05-14 DIAGNOSIS — D696 Thrombocytopenia, unspecified: Secondary | ICD-10-CM

## 2024-05-14 DIAGNOSIS — T7840XD Allergy, unspecified, subsequent encounter: Secondary | ICD-10-CM

## 2024-05-14 MED ORDER — PROBIOTIC PRODUCT PO CAPS: 1.0000 | ORAL_CAPSULE | Freq: Every day | ORAL | 11 refills | Status: AC

## 2024-05-14 MED ORDER — VITAMIN K2-VITAMIN D3 90-125 MCG PO CAPS: 1.0000 | ORAL_CAPSULE | Freq: Every day | ORAL | 11 refills | Status: AC

## 2024-05-14 NOTE — Assessment & Plan Note (Addendum)
 Hydrate and monitor, asymptomatic

## 2024-05-14 NOTE — Patient Instructions (Signed)
 Shingrix is the new shingles shot, 2 shots over 2-6 months, confirm coverage with insurance and document, then can return here for shots with nurse appt or at pharmacy   [facility] pharmacies are all walk in vaccine clinics M-F 9-4  Preventive Care 32-52 Years Old, Female Preventive care refers to lifestyle choices and visits with your health care provider that can promote health and wellness. Preventive care visits are also called wellness exams. What can I expect for my preventive care visit? Counseling Your health care provider may ask you questions about your: Medical history, including: Past medical problems. Family medical history. Pregnancy history. Current health, including: Menstrual cycle. Method of birth control. Emotional well-being. Home life and relationship well-being. Sexual activity and sexual health. Lifestyle, including: Alcohol, nicotine or tobacco, and drug use. Access to firearms. Diet, exercise, and sleep habits. Work and work astronomer. Sunscreen use. Safety issues such as seatbelt and bike helmet use. Physical exam Your health care provider will check your: Height and weight. These may be used to calculate your BMI (body mass index). BMI is a measurement that tells if you are at a healthy weight. Waist circumference. This measures the distance around your waistline. This measurement also tells if you are at a healthy weight and may help predict your risk of certain diseases, such as type 2 diabetes and high blood pressure. Heart rate and blood pressure. Body temperature. Skin for abnormal spots. What immunizations do I need?  Vaccines are usually given at various ages, according to a schedule. Your health care provider will recommend vaccines for you based on your age, medical history, and lifestyle or other factors, such as travel or where you work. What tests do I need? Screening Your health care provider may recommend screening tests for certain  conditions. This may include: Lipid and cholesterol levels. Diabetes screening. This is done by checking your blood sugar (glucose) after you have not eaten for a while (fasting). Pelvic exam and Pap test. Hepatitis B test. Hepatitis C test. HIV (human immunodeficiency virus) test. STI (sexually transmitted infection) testing, if you are at risk. Lung cancer screening. Colorectal cancer screening. Mammogram. Talk with your health care provider about when you should start having regular mammograms. This may depend on whether you have a family history of breast cancer. BRCA-related cancer screening. This may be done if you have a family history of breast, ovarian, tubal, or peritoneal cancers. Bone density scan. This is done to screen for osteoporosis. Talk with your health care provider about your test results, treatment options, and if necessary, the need for more tests. Follow these instructions at home: Eating and drinking  Eat a diet that includes fresh fruits and vegetables, whole grains, lean protein, and low-fat dairy products. Take vitamin and mineral supplements as recommended by your health care provider. Do not drink alcohol if: Your health care provider tells you not to drink. You are pregnant, may be pregnant, or are planning to become pregnant. If you drink alcohol: Limit how much you have to 0-1 drink a day. Know how much alcohol is in your drink. In the [place] one drink equals one 12 oz bottle of beer (355 mL), one 5 oz glass of wine (148 mL), or one 1 oz glass of hard liquor (44 mL). Lifestyle Brush your teeth every morning and night with fluoride toothpaste. Floss one time each day. Exercise for at least 30 minutes 5 or more days each week. Do not use any products that contain nicotine or tobacco.  These products include cigarettes, chewing tobacco, and vaping devices, such as e-cigarettes. If you need help quitting, ask your health care provider. Do not use drugs. If  you are sexually active, practice safe sex. Use a condom or other form of protection to prevent STIs. If you do not wish to become pregnant, use a form of birth control. If you plan to become pregnant, see your health care provider for a prepregnancy visit. Take aspirin only as told by your health care provider. Make sure that you understand how much to take and what form to take. Work with your health care provider to find out whether it is safe and beneficial for you to take aspirin daily. Find healthy ways to manage stress, such as: Meditation, yoga, or listening to music. Journaling. Talking to a trusted person. Spending time with friends and family. Minimize exposure to UV radiation to reduce your risk of skin cancer. Safety Always wear your seat belt while driving or riding in a vehicle. Do not drive: If you have been drinking alcohol. Do not ride with someone who has been drinking. When you are tired or distracted. While texting. If you have been using any mind-altering substances or drugs. Wear a helmet and other protective equipment during sports activities. If you have firearms in your house, make sure you follow all gun safety procedures. Seek help if you have been physically or sexually abused. What's next? Visit your health care provider once a year for an annual wellness visit. Ask your health care provider how often you should have your eyes and teeth checked. Stay up to date on all vaccines. This information is not intended to replace advice given to you by your health care provider. Make sure you discuss any questions you have with your health care provider. Document Revised: 12/21/2020 Document Reviewed: 12/21/2020 Elsevier Patient Education  2024 [employer].

## 2024-05-15 ENCOUNTER — Telehealth: Payer: Self-pay | Admitting: *Deleted

## 2024-05-15 LAB — CBC WITH DIFFERENTIAL/PLATELET
Basophils Absolute: 0 K/uL (ref 0.0–0.1)
Basophils Relative: 0.4 % (ref 0.0–3.0)
Eosinophils Absolute: 0.1 K/uL (ref 0.0–0.7)
Eosinophils Relative: 1.7 % (ref 0.0–5.0)
HCT: 38.1 % (ref 36.0–46.0)
Hemoglobin: 12.6 g/dL (ref 12.0–15.0)
Lymphocytes Relative: 23.9 % (ref 12.0–46.0)
Lymphs Abs: 1.2 K/uL (ref 0.7–4.0)
MCHC: 33.2 g/dL (ref 30.0–36.0)
MCV: 89.7 fl (ref 78.0–100.0)
Monocytes Absolute: 0.3 K/uL (ref 0.1–1.0)
Monocytes Relative: 6.5 % (ref 3.0–12.0)
Neutro Abs: 3.4 K/uL (ref 1.4–7.7)
Neutrophils Relative %: 67.5 % (ref 43.0–77.0)
Platelets: 133 K/uL — ABNORMAL LOW (ref 150.0–400.0)
RBC: 4.24 Mil/uL (ref 3.87–5.11)
RDW: 12.2 % (ref 11.5–15.5)
WBC: 5 K/uL (ref 4.0–10.5)

## 2024-05-15 LAB — COMPREHENSIVE METABOLIC PANEL WITH GFR
ALT: 9 U/L (ref 0–35)
AST: 15 U/L (ref 0–37)
Albumin: 4.2 g/dL (ref 3.5–5.2)
Alkaline Phosphatase: 53 U/L (ref 39–117)
BUN: 9 mg/dL (ref 6–23)
CO2: 27 meq/L (ref 19–32)
Calcium: 9.2 mg/dL (ref 8.4–10.5)
Chloride: 102 meq/L (ref 96–112)
Creatinine, Ser: 0.88 mg/dL (ref 0.40–1.20)
GFR: 75.35 mL/min (ref >60.00)
Glucose, Bld: 74 mg/dL (ref 70–99)
Potassium: 3.6 meq/L (ref 3.5–5.1)
Sodium: 139 meq/L (ref 135–145)
Total Bilirubin: 0.7 mg/dL (ref 0.2–1.2)
Total Protein: 7.2 g/dL (ref 6.0–8.3)

## 2024-05-15 LAB — LIPID PANEL
Cholesterol: 167 mg/dL (ref 0–200)
HDL: 74.9 mg/dL (ref >39.00)
LDL Cholesterol: 85 mg/dL (ref 0–99)
NonHDL: 92.08
Total CHOL/HDL Ratio: 2
Triglycerides: 33 mg/dL (ref 0.0–149.0)
VLDL: 6.6 mg/dL (ref 0.0–40.0)

## 2024-05-15 LAB — VITAMIN D 25 HYDROXY (VIT D DEFICIENCY, FRACTURES): VITD: >120 ng/mL

## 2024-05-15 LAB — TSH: TSH: 0.88 u[IU]/mL (ref 0.35–5.50)

## 2024-05-15 NOTE — Telephone Encounter (Signed)
 Left message to call back.

## 2024-05-15 NOTE — Telephone Encounter (Signed)
 Per Dr. Antonio stop vit d

## 2024-05-15 NOTE — Telephone Encounter (Signed)
 CRITICAL VALUE STICKER  CRITICAL VALUE:  Vitamin D  >120  MESSENGER (representative from lab): Hope

## 2024-05-17 ENCOUNTER — Ambulatory Visit: Payer: Self-pay | Admitting: Family Medicine

## 2024-05-17 ENCOUNTER — Encounter: Payer: Self-pay | Admitting: Family Medicine

## 2024-05-17 DIAGNOSIS — E673 Hypervitaminosis D: Secondary | ICD-10-CM

## 2024-05-18 NOTE — Telephone Encounter (Signed)
 Pts vitamin d was >120.

## 2024-05-22 ENCOUNTER — Telehealth: Payer: Self-pay

## 2024-05-22 NOTE — Telephone Encounter (Signed)
 Copied from CRM [id number]. Topic: Clinical - Medication Question >> May 22, 2024  3:42 PM Aisha D wrote: Reason for CRM: Trish with [facility] pharmacy stated that they received a prescription request for the Vitamin D-Vitamin K (VITAMIN K2-VITAMIN D3) 90-125 MCG CAPS and Probiotic Product (MISC INTESTINAL FLORA REGULAT) CAPS . Carley stated that it looks like the pt made the request but they are unable to fill the medication because the provider would need to request the medications.

## 2024-05-22 NOTE — Telephone Encounter (Signed)
 Spoke with pt and she does not want the medication going to [facility]. She stated that Dr. Domenica gave her a printed prescription and she will be taking it to [facility].

## 2024-07-10 ENCOUNTER — Other Ambulatory Visit

## 2024-07-10 DIAGNOSIS — J309 Allergic rhinitis, unspecified: Secondary | ICD-10-CM

## 2024-07-10 DIAGNOSIS — J4540 Moderate persistent asthma, uncomplicated: Secondary | ICD-10-CM

## 2024-07-10 DIAGNOSIS — E673 Hypervitaminosis D: Secondary | ICD-10-CM | POA: Diagnosis not present

## 2024-07-10 DIAGNOSIS — Z0000 Encounter for general adult medical examination without abnormal findings: Secondary | ICD-10-CM

## 2024-07-10 DIAGNOSIS — R5383 Other fatigue: Secondary | ICD-10-CM

## 2024-07-10 DIAGNOSIS — T7840XD Allergy, unspecified, subsequent encounter: Secondary | ICD-10-CM

## 2024-07-10 LAB — VITAMIN D 25 HYDROXY (VIT D DEFICIENCY, FRACTURES): VITD: 74.62 ng/mL (ref 30.00–100.00)

## 2024-07-10 LAB — VITAMIN B12: Vitamin B-12: 1192 pg/mL — ABNORMAL HIGH (ref 211–911)

## 2024-07-13 NOTE — Progress Notes (Signed)
"  Pt reviewed via MyChart.   "

## 2024-11-16 ENCOUNTER — Ambulatory Visit: Admitting: Family Medicine

## 2025-05-17 ENCOUNTER — Encounter: Admitting: Family Medicine

## 2025-05-24 ENCOUNTER — Encounter: Admitting: Family Medicine
